# Patient Record
Sex: Male | Born: 1950 | Race: White | Hispanic: No | Marital: Married | Smoking: Former smoker
Health system: Southern US, Community
[De-identification: ages and names within clinical notes are randomized; demographics above are authoritative.]

## PROBLEM LIST (undated history)

## (undated) DIAGNOSIS — R1310 Dysphagia, unspecified: Secondary | ICD-10-CM

## (undated) DIAGNOSIS — T7840XA Allergy, unspecified, initial encounter: Secondary | ICD-10-CM

## (undated) DIAGNOSIS — F329 Major depressive disorder, single episode, unspecified: Secondary | ICD-10-CM

## (undated) DIAGNOSIS — M1990 Unspecified osteoarthritis, unspecified site: Secondary | ICD-10-CM

## (undated) DIAGNOSIS — C4491 Basal cell carcinoma of skin, unspecified: Secondary | ICD-10-CM

## (undated) DIAGNOSIS — I639 Cerebral infarction, unspecified: Secondary | ICD-10-CM

## (undated) DIAGNOSIS — K219 Gastro-esophageal reflux disease without esophagitis: Secondary | ICD-10-CM

## (undated) DIAGNOSIS — R1110 Vomiting, unspecified: Secondary | ICD-10-CM

## (undated) DIAGNOSIS — B019 Varicella without complication: Secondary | ICD-10-CM

## (undated) DIAGNOSIS — F32A Depression, unspecified: Secondary | ICD-10-CM

## (undated) DIAGNOSIS — B029 Zoster without complications: Secondary | ICD-10-CM

## (undated) DIAGNOSIS — Z860100 Personal history of colon polyps, unspecified: Secondary | ICD-10-CM

## (undated) DIAGNOSIS — IMO0001 Reserved for inherently not codable concepts without codable children: Secondary | ICD-10-CM

## (undated) DIAGNOSIS — B059 Measles without complication: Secondary | ICD-10-CM

## (undated) DIAGNOSIS — E785 Hyperlipidemia, unspecified: Secondary | ICD-10-CM

## (undated) DIAGNOSIS — B269 Mumps without complication: Secondary | ICD-10-CM

## (undated) DIAGNOSIS — H269 Unspecified cataract: Secondary | ICD-10-CM

## (undated) DIAGNOSIS — E7800 Pure hypercholesterolemia, unspecified: Secondary | ICD-10-CM

## (undated) DIAGNOSIS — K449 Diaphragmatic hernia without obstruction or gangrene: Secondary | ICD-10-CM

## (undated) DIAGNOSIS — N529 Male erectile dysfunction, unspecified: Secondary | ICD-10-CM

## (undated) DIAGNOSIS — K648 Other hemorrhoids: Secondary | ICD-10-CM

## (undated) DIAGNOSIS — F411 Generalized anxiety disorder: Secondary | ICD-10-CM

## (undated) DIAGNOSIS — J309 Allergic rhinitis, unspecified: Secondary | ICD-10-CM

## (undated) DIAGNOSIS — J302 Other seasonal allergic rhinitis: Secondary | ICD-10-CM

## (undated) HISTORY — DX: Cerebral infarction, unspecified: I63.9

## (undated) HISTORY — DX: Major depressive disorder, single episode, unspecified: F32.9

## (undated) HISTORY — DX: Mumps without complication: B26.9

## (undated) HISTORY — DX: Other hemorrhoids: K64.8

## (undated) HISTORY — DX: Vomiting, unspecified: R11.10

## (undated) HISTORY — DX: Dysphagia, unspecified: R13.10

## (undated) HISTORY — DX: Gastro-esophageal reflux disease without esophagitis: K21.9

## (undated) HISTORY — PX: FRACTURE SURGERY: SHX138

## (undated) HISTORY — DX: Male erectile dysfunction, unspecified: N52.9

## (undated) HISTORY — DX: Personal history of colon polyps, unspecified: Z86.0100

## (undated) HISTORY — DX: Hyperlipidemia, unspecified: E78.5

## (undated) HISTORY — DX: Allergic rhinitis, unspecified: J30.9

## (undated) HISTORY — DX: Zoster without complications: B02.9

## (undated) HISTORY — PX: SMALL INTESTINE SURGERY: SHX150

## (undated) HISTORY — DX: Allergy, unspecified, initial encounter: T78.40XA

## (undated) HISTORY — DX: Unspecified osteoarthritis, unspecified site: M19.90

## (undated) HISTORY — DX: Unspecified cataract: H26.9

## (undated) HISTORY — PX: EYE SURGERY: SHX253

## (undated) HISTORY — DX: Pure hypercholesterolemia, unspecified: E78.00

## (undated) HISTORY — DX: Measles without complication: B05.9

## (undated) HISTORY — DX: Other seasonal allergic rhinitis: J30.2

## (undated) HISTORY — DX: Generalized anxiety disorder: F41.1

## (undated) HISTORY — DX: Reserved for inherently not codable concepts without codable children: IMO0001

## (undated) HISTORY — DX: Depression, unspecified: F32.A

## (undated) HISTORY — DX: Varicella without complication: B01.9

## (undated) HISTORY — DX: Personal history of colonic polyps: Z86.010

## (undated) HISTORY — DX: Diaphragmatic hernia without obstruction or gangrene: K44.9

## (undated) HISTORY — DX: Basal cell carcinoma of skin, unspecified: C44.91

---

## 1989-03-11 HISTORY — PX: OTHER SURGICAL HISTORY: SHX169

## 1998-03-11 HISTORY — PX: OTHER SURGICAL HISTORY: SHX169

## 2008-03-11 HISTORY — PX: OTHER SURGICAL HISTORY: SHX169

## 2009-02-27 ENCOUNTER — Ambulatory Visit: Payer: Self-pay | Admitting: Gastroenterology

## 2010-03-11 DIAGNOSIS — I639 Cerebral infarction, unspecified: Secondary | ICD-10-CM

## 2010-03-11 DIAGNOSIS — H53452 Other localized visual field defect, left eye: Secondary | ICD-10-CM

## 2010-03-11 HISTORY — DX: Other localized visual field defect, left eye: H53.452

## 2010-03-11 HISTORY — DX: Cerebral infarction, unspecified: I63.9

## 2011-02-26 ENCOUNTER — Inpatient Hospital Stay: Payer: Self-pay | Admitting: Internal Medicine

## 2011-02-27 DIAGNOSIS — I6789 Other cerebrovascular disease: Secondary | ICD-10-CM

## 2011-03-26 ENCOUNTER — Encounter: Payer: Self-pay | Admitting: Family Medicine

## 2011-04-08 ENCOUNTER — Ambulatory Visit: Payer: Self-pay | Admitting: Family Medicine

## 2011-04-12 ENCOUNTER — Encounter: Payer: Self-pay | Admitting: Family Medicine

## 2011-05-01 ENCOUNTER — Ambulatory Visit: Payer: Self-pay | Admitting: Gastroenterology

## 2011-11-18 ENCOUNTER — Telehealth: Payer: Self-pay

## 2011-11-18 NOTE — Telephone Encounter (Signed)
Pt is having sleep issues and diarrhea - thinks it might be related to the lipitor.   Also, wants to know if we received the medical records from [facility].   Please call [phone]

## 2011-11-18 NOTE — Telephone Encounter (Signed)
Have we gotten his records?

## 2011-11-19 NOTE — Telephone Encounter (Signed)
Have patient stop the Lipitor at this time to see if this helps. Please have him follow up in the week to discuss medication management, sooner if the diarrhea persists. Please increase your water intake to help prevent dehydration due to the diarrhea as well.

## 2011-11-19 NOTE — Telephone Encounter (Signed)
Yes they came in today.

## 2011-11-19 NOTE — Telephone Encounter (Signed)
Patient is having difficulty with diarrhea thinks may be from the Lipitor he is taking, please advise.

## 2011-11-20 NOTE — Telephone Encounter (Signed)
Gave wife instr's to have pt DC Lipitor and see if Sxs resolve. If diarrhea persists have pt come in for eval. If Sxs resolve CB and we can check to see if Dr Katrinka Blazing wants to see pt sooner for med management bf his scheduled appt on 12/16/11. Wife agreed.

## 2011-12-02 ENCOUNTER — Encounter: Payer: Self-pay | Admitting: *Deleted

## 2011-12-03 ENCOUNTER — Encounter: Payer: Self-pay | Admitting: Family Medicine

## 2011-12-16 ENCOUNTER — Encounter: Payer: Self-pay | Admitting: Family Medicine

## 2011-12-16 ENCOUNTER — Ambulatory Visit (INDEPENDENT_AMBULATORY_CARE_PROVIDER_SITE_OTHER): Payer: BC Managed Care – PPO | Admitting: Family Medicine

## 2011-12-16 VITALS — BP 124/82 | HR 73 | Temp 98.2°F | Resp 18 | Ht 71.0 in | Wt 197.0 lb

## 2011-12-16 DIAGNOSIS — H9319 Tinnitus, unspecified ear: Secondary | ICD-10-CM

## 2011-12-16 DIAGNOSIS — I6350 Cerebral infarction due to unspecified occlusion or stenosis of unspecified cerebral artery: Secondary | ICD-10-CM

## 2011-12-16 DIAGNOSIS — J309 Allergic rhinitis, unspecified: Secondary | ICD-10-CM

## 2011-12-16 DIAGNOSIS — R7309 Other abnormal glucose: Secondary | ICD-10-CM

## 2011-12-16 DIAGNOSIS — E7800 Pure hypercholesterolemia, unspecified: Secondary | ICD-10-CM

## 2011-12-16 DIAGNOSIS — N529 Male erectile dysfunction, unspecified: Secondary | ICD-10-CM

## 2011-12-16 DIAGNOSIS — R1084 Generalized abdominal pain: Secondary | ICD-10-CM

## 2011-12-16 DIAGNOSIS — Z23 Encounter for immunization: Secondary | ICD-10-CM

## 2011-12-16 DIAGNOSIS — I639 Cerebral infarction, unspecified: Secondary | ICD-10-CM | POA: Insufficient documentation

## 2011-12-16 DIAGNOSIS — R197 Diarrhea, unspecified: Secondary | ICD-10-CM

## 2011-12-16 LAB — COMPREHENSIVE METABOLIC PANEL
ALT: 28 U/L (ref 0–53)
AST: 24 U/L (ref 0–37)
Albumin: 4.5 g/dL (ref 3.5–5.2)
CO2: 29 mEq/L (ref 19–32)
Calcium: 10.1 mg/dL (ref 8.4–10.5)
Chloride: 103 mEq/L (ref 96–112)
Creat: 0.97 mg/dL (ref 0.50–1.35)
Potassium: 4.9 mEq/L (ref 3.5–5.3)
Sodium: 138 mEq/L (ref 135–145)
Total Protein: 7.1 g/dL (ref 6.0–8.3)

## 2011-12-16 LAB — CBC
Hemoglobin: 15.2 g/dL (ref 13.0–17.0)
Platelets: 252 10*3/uL (ref 150–400)
RBC: 5.04 MIL/uL (ref 4.22–5.81)
WBC: 8.4 10*3/uL (ref 4.0–10.5)

## 2011-12-16 LAB — CK: Total CK: 87 U/L (ref 7–232)

## 2011-12-16 LAB — POCT GLYCOSYLATED HEMOGLOBIN (HGB A1C): Hemoglobin A1C: 5.2

## 2011-12-16 LAB — LIPID PANEL: Total CHOL/HDL Ratio: 3.9 Ratio

## 2011-12-16 MED ORDER — FLUTICASONE PROPIONATE 50 MCG/ACT NA SUSP: 2.0000 | Freq: Every day | NASAL | Status: DC

## 2011-12-16 MED ORDER — SILDENAFIL CITRATE 100 MG PO TABS: 50.0000 mg | ORAL_TABLET | Freq: Every day | ORAL | Status: DC | PRN

## 2011-12-16 NOTE — Patient Instructions (Addendum)
1. Pure hypercholesterolemia    2. Other abnormal glucose    3. CVA (cerebral infarction)    4. Tinnitus    5. Allergic rhinitis    6. Diarrhea    7. Abdominal pain, generalized    8. Need for prophylactic vaccination and inoculation against influenza  Flu vaccine greater than or equal to 63yo preservative free IM

## 2011-12-16 NOTE — Progress Notes (Signed)
Subjective:    Patient ID: Tyrone Curtis, male    DOB: 1950/08/27, 61 y.o.   MRN: [id number]  HPI44 year-old Caucasian male presenting to establish care and for three month follow-up for the following:  1.  Hypercholesterolemia:  Three month follow-up; no changes to management made at last visit.  Reports good tolerance to treatment, good compliance; good symptom control.  No side effects to Lipitor.  Denies chest pain, palpitations, shortness of breath, leg swelling; denies HA, dizziness, new focal weakness or paresthesias.  Did hold Lipitor due to diarrhea without improvement so has restarted it.  2.  Glucose Intolerance:  Six month follow-up; not as compliant with diet as previously had been.  Weight down one pound.  Has become more physically active.  3.  CVA:  Three month follow-up.  Now doing own yard work; last visit in 09/2011 with Tyrone Curtis/Neurology.  Follow-up in 02/2012.  Numbnesss L facial persistent, L side weakness much improved.  Using cane mainly due to vision impairment. Unable to drive permanently due to loss of peripheral vision.  Continues to take ASA 325mg  daily.  4.  Anxiety:  Stopped taking Xanax regularly; took Xanax twice last week.  Continues to get a little anxious in crowds.  Would like to enjoy a beer now and then.  5.  Insomnia:  Stopped taking Ambien.  Sleeping 7-8 hours; will wake up around 3:30am and stay awake for one hour and then returns to sleep. Has never required 8+ hours of sleep per night.    7.  Diarrhea:  Stopped Lipitor x 2 weeks due to diarrhea but no improvement so restarted Lipitor.  Grapes came in and was eating grapes daily; thinks diarrhea may have been due to grapes.  Feels diarrhea may be secondary to eating grapes.  Onset of abdominal ache lower abdomen for past one week.  Diarrhea resolved one week ago.  No melena; no bloody stools. Regular bowel movements.  No constipation. Appetite is normal.  Unable to tolerate chocolate. Was having issue with  peanut butter.  Compliance with Omeprazole daily.  No choking or GERD symptoms.  8.  Tinnitus:  Onset three weeks ago; B ears ringing; with shaking head, feels harp ringing in R ear.  +nasal congestion; only on L side.  L eye watering.    Does have Flonase but not using; taking Claritin one daily.  No associated hearing loss.  Worried that ASA 325mg  daily causing ringing in ears.  Also worried that Lipitor causing ringing in ears.  9.  ED: would like to restart Viagra if possible.    Review of Systems  Constitutional: Negative for fever, chills, diaphoresis and fatigue.  HENT: Positive for congestion, rhinorrhea, postnasal drip and tinnitus. Negative for hearing loss, ear pain, sore throat, sneezing, trouble swallowing and voice change.   Respiratory: Negative for cough and shortness of breath.   Cardiovascular: Negative for chest pain, palpitations and leg swelling.  Gastrointestinal: Positive for abdominal pain and diarrhea. Negative for nausea, vomiting, constipation and blood in stool.  Neurological: Positive for numbness. Negative for facial asymmetry, weakness, light-headedness and headaches.  Psychiatric/Behavioral: Positive for disturbed wake/sleep cycle. Negative for suicidal ideas and self-injury. The patient is nervous/anxious.         Past Medical History  Diagnosis Date  . CVA (cerebral vascular accident)   . Arthritis   . 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 Erectile dysfunction   . Anxiety state, unspecified   . Regurgitation   .  Dysphagia, unspecified   . Herpes zoster without mention of complication   . Pure hypercholesterolemia   . Allergic rhinitis, cause unspecified   . Basal cell carcinoma     facial  . Personal history of colonic polyps   . Internal hemorrhoids without mention of complication   . Measles   . Chicken pox   . Mumps     x 2    Past Surgical History  Procedure Date  . Small bowel mass 1991    removed part  of small intestine benign mass  . Basal cell carcinoma of anterior chest 2000    resection; [name]  . Cataract surgery 2010    with lens repacement Left    Prior to Admission medications   Medication Sig Start Date End Date Taking? Authorizing Provider  ALPRAZolam [name]) 0.5 MG tablet Take 0.5 mg by mouth at bedtime as needed.   Yes Historical Provider, MD  aspirin 325 MG EC tablet Take 325 mg by mouth daily.   Yes Historical Provider, MD  atorvastatin (LIPITOR) 20 MG tablet Take 20 mg by mouth daily.   Yes Historical Provider, MD  loratadine (CLARITIN) 10 MG tablet Take 10 mg by mouth daily.   Yes Historical Provider, MD  Multiple Vitamin (MULTIVITAMINS PO) Take by mouth daily.   Yes Historical Provider, MD  omeprazole (PRILOSEC) 20 MG capsule Take 20 mg by mouth daily.   Yes Historical Provider, MD  polycarbophil (FIBERCON) 625 MG tablet Take 625 mg by mouth daily.    Historical Provider, MD  zolpidem (AMBIEN) 10 MG tablet Take 10 mg by mouth at bedtime as needed.    Historical Provider, MD    Allergies  Allergen Reactions  . Zyrtec D (Cetirizine-Pseudoephedrine Er)     "talking out of his head"    History   Social History  . Marital Status: Married    Spouse Name: N/A    Number of Children: 1  . Years of Education: N/A   Occupational History  . Disability     CVA  . Previous owner of [employer]    Social History Main Topics  . Smoking status: Former Smoker -- 2.0 packs/day for 25 years    Types: Cigarettes  . Smokeless tobacco: Not on file   Comment: quit in 1984  . Alcohol Use: No     no alcohol use since CVA  . Drug Use: Not on file  . Sexually Active: Not on file   Other Topics Concern  . Not on file   Social History Narrative   Always uses seat belts. Smoke alarm and carbon monoxide detector in home.No Guns. Caffeine: Coffee 5 x daily.Married x 37 years Happily. Lives with spouse bought a beach house at [employer] in 2011. Also has mountain house in [place].  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 Exercise: 3 x week walking; 2 miles daily with wife, during winter months in gym-gazelle and stair-master. Pt doing PT 30 - 45 mins per day.    Family History  Problem Relation Age of Onset  . Hyperlipidemia Sister   . Hyperlipidemia Brother   . Diabetes Mother   . Depression Mother   . Esophageal varices Mother   . Anxiety disorder Sister   . Bipolar disorder Brother   . Cancer Father     unknown    Objective:   Physical Exam  Nursing note and vitals reviewed. Constitutional: He is oriented to person, place, and time.  He appears well-developed and well-nourished. No distress.  HENT:  Head: Normocephalic and atraumatic.  Right Ear: External ear normal.  Left Ear: External ear normal.  Nose: Nose normal.  Mouth/Throat: Oropharynx is clear and moist.  Eyes: Conjunctivae normal and EOM are normal. Pupils are equal, round, and reactive to light.  Neck: Normal range of motion. Neck supple. No thyromegaly present.  Cardiovascular: Normal rate, regular rhythm, normal heart sounds and intact distal pulses.   No murmur heard. Pulmonary/Chest: Effort normal and breath sounds normal.  Abdominal: Soft. Bowel sounds are normal. He exhibits no distension. There is no tenderness. There is no rebound.  Lymphadenopathy:    He has no cervical adenopathy.  Neurological: He is alert and oriented to person, place, and time. No cranial nerve deficit. He exhibits normal muscle tone. Coordination normal.  Skin: He is not diaphoretic.  Psychiatric: He has a normal mood and affect. His behavior is normal. Judgment and thought content normal.   INFLUENZA VACCINE ADMINISTERED.     Assessment & Plan:   1. Pure hypercholesterolemia  CBC, CK, Comprehensive metabolic panel, Lipid panel, POCT glycosylated hemoglobin (Hb A1C)  2. Other abnormal glucose  CBC, CK, Comprehensive metabolic panel, Lipid panel, POCT glycosylated  hemoglobin (Hb A1C)  3. CVA (cerebral infarction)  CBC, CK, Comprehensive metabolic panel, Lipid panel, POCT glycosylated hemoglobin (Hb A1C)  4. Tinnitus  CBC, CK, Comprehensive metabolic panel, Lipid panel, POCT glycosylated hemoglobin (Hb A1C)  5. Allergic rhinitis  CBC, CK, Comprehensive metabolic panel, Lipid panel, POCT glycosylated hemoglobin (Hb A1C), fluticasone (FLONASE) 50 MCG/ACT nasal spray  6. Diarrhea  CBC, CK, Comprehensive metabolic panel, Lipid panel, POCT glycosylated hemoglobin (Hb A1C)  7. Abdominal pain, generalized  CBC, CK, Comprehensive metabolic panel, Lipid panel, POCT glycosylated hemoglobin (Hb A1C)  8. Need for prophylactic vaccination and inoculation against influenza  Flu vaccine greater than or equal to 3yo preservative free IM, CBC, CK, Comprehensive metabolic panel, Lipid panel, POCT glycosylated hemoglobin (Hb A1C)  9. Pure hypercholesterolemia Acute CBC, CK, Comprehensive metabolic panel, Lipid panel, POCT glycosylated hemoglobin (Hb A1C)  10. Erectile dysfunction  sildenafil (VIAGRA) 100 MG tablet    1. CVA: stable; persistent L sided numbness; minimal weakness L side; persistent loss of peripheral vision.  Continue ASA 325mg  daily; follow-up neurology/Tyrone Curtis in 02/2012. 2.  Hyperlipidemia:  Stable; goal LDL< 70 due to CVA.  Obtain labs; continue with current medication. 3.  Glucose intolerance: stable; obtain labs.  Dietary modification. 4.  Allergic Rhinitis: worsening; restart Flonase daily; continue Claritin 10mg  daily.  May be etiology to tinnitus. 5.  ED: persistent; rx for Viagra; confirm with neurology/Tyrone Curtis that can resume medication. 6. Diarrhea: new and now improved.  Associated with abdominal pain; benign exam. BRAT diet, hydrate. 7.  Abdominal pain: New.  Onset past two weeks after having two weeks of diarrhea; obtain labs; BRAT diet; if persists > 1 month to call office. 8.  S/p flu vaccine. 9.  Tinnitus:  New.  Onset three weeks ago.  Treat  allergic rhinitis more aggressively with Flonase daily in addition to Claritin.  If persists, refer to ENT.  Continue ASA for secondary stroke prevention.  Meds ordered this encounter  Medications  . fluticasone (FLONASE) 50 MCG/ACT nasal spray    Sig: Place 2 sprays into the nose daily.    Dispense:  16 g    Refill:  6  . sildenafil (VIAGRA) 100 MG tablet    Sig: Take 0.5-1 tablets (50-100 mg total) by mouth daily as  needed for erectile dysfunction.    Dispense:  8 tablet    Refill:  11

## 2011-12-23 ENCOUNTER — Telehealth: Payer: Self-pay | Admitting: Radiology

## 2011-12-23 NOTE — Telephone Encounter (Signed)
Would like copy of labs sent to him, I will provide.

## 2012-03-15 ENCOUNTER — Encounter: Payer: Self-pay | Admitting: Family Medicine

## 2012-04-16 ENCOUNTER — Other Ambulatory Visit: Payer: Self-pay | Admitting: *Deleted

## 2012-04-16 MED ORDER — ATORVASTATIN CALCIUM 40 MG PO TABS: 40.0000 mg | ORAL_TABLET | Freq: Every day | ORAL | Status: DC

## 2012-05-12 ENCOUNTER — Encounter: Payer: BC Managed Care – PPO | Admitting: Family Medicine

## 2012-05-13 ENCOUNTER — Other Ambulatory Visit: Payer: Self-pay | Admitting: Physician Assistant

## 2012-05-16 ENCOUNTER — Other Ambulatory Visit: Payer: Self-pay | Admitting: Family Medicine

## 2012-06-16 ENCOUNTER — Other Ambulatory Visit: Payer: Self-pay | Admitting: Physician Assistant

## 2012-06-23 ENCOUNTER — Encounter: Payer: BC Managed Care – PPO | Admitting: Family Medicine

## 2012-07-08 ENCOUNTER — Other Ambulatory Visit: Payer: Self-pay

## 2012-07-08 MED ORDER — ATORVASTATIN CALCIUM 40 MG PO TABS: 40.0000 mg | ORAL_TABLET | Freq: Every day | ORAL | Status: DC

## 2012-07-20 ENCOUNTER — Encounter: Payer: Self-pay | Admitting: Family Medicine

## 2012-07-20 ENCOUNTER — Ambulatory Visit (INDEPENDENT_AMBULATORY_CARE_PROVIDER_SITE_OTHER): Payer: BC Managed Care – PPO | Admitting: Family Medicine

## 2012-07-20 VITALS — BP 126/72 | HR 66 | Temp 97.9°F | Resp 16 | Ht 71.0 in | Wt 222.0 lb

## 2012-07-20 DIAGNOSIS — Z0000 Encounter for general adult medical examination without abnormal findings: Secondary | ICD-10-CM

## 2012-07-20 LAB — COMPREHENSIVE METABOLIC PANEL
Albumin: 3.8 g/dL (ref 3.5–5.2)
Alkaline Phosphatase: 83 U/L (ref 39–117)
BUN: 10 mg/dL (ref 6–23)
CO2: 25 mEq/L (ref 19–32)
Calcium: 9.6 mg/dL (ref 8.4–10.5)
Chloride: 105 mEq/L (ref 96–112)
Glucose, Bld: 95 mg/dL (ref 70–99)
Potassium: 4.5 mEq/L (ref 3.5–5.3)

## 2012-07-20 LAB — LIPID PANEL
Cholesterol: 131 mg/dL (ref 0–200)
HDL: 36 mg/dL — ABNORMAL LOW (ref >39)
Triglycerides: 157 mg/dL — ABNORMAL HIGH (ref <150)

## 2012-07-20 LAB — POCT URINALYSIS DIPSTICK
Ketones, UA: NEGATIVE
Protein, UA: NEGATIVE
Spec Grav, UA: 1.01
pH, UA: 7

## 2012-07-20 LAB — CBC WITH DIFFERENTIAL/PLATELET
Basophils Absolute: 0 10*3/uL (ref 0.0–0.1)
Eosinophils Relative: 2 % (ref 0–5)
HCT: 35.6 % — ABNORMAL LOW (ref 39.0–52.0)
Lymphocytes Relative: 31 % (ref 12–46)
MCHC: 32 g/dL (ref 30.0–36.0)
MCV: 77.1 fL — ABNORMAL LOW (ref 78.0–100.0)
Monocytes Absolute: 0.5 10*3/uL (ref 0.1–1.0)
Monocytes Relative: 8 % (ref 3–12)
RDW: 13.5 % (ref 11.5–15.5)
WBC: 7.1 10*3/uL (ref 4.0–10.5)

## 2012-07-20 LAB — PSA: PSA: 0.78 ng/mL (ref <=4.00)

## 2012-07-20 LAB — TSH: TSH: 5.64 u[IU]/mL — ABNORMAL HIGH (ref 0.350–4.500)

## 2012-07-20 LAB — FOLATE: Folate: >20 ng/mL

## 2012-07-20 LAB — VITAMIN B12: Vitamin B-12: 356 pg/mL (ref 211–911)

## 2012-07-20 MED ORDER — ATORVASTATIN CALCIUM 40 MG PO TABS: 40.0000 mg | ORAL_TABLET | Freq: Every day | ORAL | Status: DC

## 2012-07-20 MED ORDER — OMEPRAZOLE 20 MG PO CPDR: 20.0000 mg | DELAYED_RELEASE_CAPSULE | Freq: Every day | ORAL | Status: DC

## 2012-07-20 MED ORDER — ALPRAZOLAM 0.5 MG PO TABS: 0.5000 mg | ORAL_TABLET | Freq: Every evening | ORAL | Status: DC | PRN

## 2012-07-20 NOTE — Progress Notes (Signed)
  Subjective:    Patient ID: Tyrone Curtis, male    DOB: 1950-06-10, 62 y.o.   MRN: [id number]  HPI    Review of Systems  HENT: Positive for hearing loss and tinnitus.   Eyes: Positive for visual disturbance.  Respiratory: Negative.   Cardiovascular: Negative.   Gastrointestinal: Negative.   Endocrine: Negative.   Genitourinary: Negative.   Musculoskeletal: Positive for gait problem.  Skin: Negative.   Neurological: Positive for weakness and numbness.  Hematological: Negative.   Psychiatric/Behavioral: Positive for confusion and agitation. The patient is nervous/anxious.        Objective:   Physical Exam        Assessment & Plan:

## 2012-07-20 NOTE — Progress Notes (Signed)
[address]   [place], [place]  [address]   [phone]  Subjective:    Patient ID: Tyrone Curtis, male    DOB: 27-Aug-1950, 62 y.o.   MRN: [id number]  HPI This 62 y.o. male presents for evaluation for CPE.    Last physical 04/30/11.  Colonoscopy 02/08/2009. Repeat in 3 years.   Iftikhar.  Does not really want to repeat now.   TDAP 04/30/11. Flu vaccine 12/16/11. Pneumovax never. Zostavax never. Eye exam. Dental exam.  Neurology: Sherryll Burger.  GINiel Hummer.    CVA:  Still numbness on L side; no peripheral vision improvement; no depth perception.  Has fallen twice in past six months.  Hit L anterior shin.   Tick bite:  Removed tick.  Still red mark.  One week.   Spends a lot of time in back yard.    Review of Systems  HENT: Positive for hearing loss and tinnitus.   Eyes: Positive for visual disturbance.  Respiratory: Negative.   Cardiovascular: Negative.   Gastrointestinal: Negative.   Endocrine: Negative.   Genitourinary: Negative.   Musculoskeletal: Positive for gait problem.  Skin: Negative.   Neurological: Positive for weakness and numbness.  Hematological: Negative.   Psychiatric/Behavioral: Positive for confusion and agitation. The patient is nervous/anxious.     Past Medical History  Diagnosis Date  . CVA (cerebral vascular accident)   . Arthritis   . 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 Erectile dysfunction   . Anxiety state, unspecified   . Regurgitation   . Dysphagia, unspecified(787.20)   . Herpes zoster without mention of complication   . Pure hypercholesterolemia   . Allergic rhinitis, cause unspecified   . Basal cell carcinoma     facial  . Personal history of colonic polyps   . Internal hemorrhoids without mention of complication   . Measles   . Chicken pox   . Mumps     x 2  . Depression   . Cataract     Past Surgical History  Procedure Laterality Date  . Small bowel mass  1991    removed part of small  intestine benign mass  . Basal cell carcinoma of anterior chest  2000    resection; [name]  . Cataract surgery  2010    with lens repacement Left  . Small intestine surgery      Prior to Admission medications   Medication Sig Start Date End Date Taking? Authorizing Provider  ALPRAZolam [name]) 0.5 MG tablet Take 1 tablet (0.5 mg total) by mouth at bedtime as needed. 07/20/12  Yes Ethelda Chick, MD  aspirin 325 MG EC tablet Take 325 mg by mouth daily.   Yes Historical Provider, MD  atorvastatin (LIPITOR) 40 MG tablet Take 1 tablet (40 mg total) by mouth daily. 07/20/12  Yes Ethelda Chick, MD  fluticasone (FLONASE) 50 MCG/ACT nasal spray Place 2 sprays into the nose daily. 12/16/11  Yes Ethelda Chick, MD  loratadine (CLARITIN) 10 MG tablet Take 10 mg by mouth daily.   Yes Historical Provider, MD  Multiple Vitamin (MULTIVITAMINS PO) Take by mouth daily.   Yes Historical Provider, MD  omeprazole (PRILOSEC) 20 MG capsule Take 1 capsule (20 mg total) by mouth daily. 07/20/12  Yes Ethelda Chick, MD  polycarbophil (FIBERCON) 625 MG tablet Take 625 mg by mouth daily.   Yes Historical Provider, MD    Allergies  Allergen Reactions  . Zyrtec D (Cetirizine-Pseudoephedrine Er)     "  talking out of his head"    History   Social History  . Marital Status: Married    Spouse Name: N/A    Number of Children: 1  . Years of Education: N/A   Occupational History  . Disability     CVA  . Previous owner of [employer]    Social History Main Topics  . Smoking status: Former Smoker -- 2.00 packs/day for 25 years    Types: Cigarettes  . Smokeless tobacco: Not on file     Comment: quit in 1984  . Alcohol Use: No     Comment: no alcohol use since CVA  . Drug Use: Not on file  . Sexually Active: Not on file   Other Topics Concern  . Not on file   Social History Narrative   Always uses seat belts. Smoke alarm and carbon monoxide detector in home.No Guns. Caffeine: Coffee 5 x daily.Married x 37  years Happily. Lives with spouse bought a beach house at [employer] in 2011. Also has mountain house in [place]. 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 Exercise: 3 x week walking; 2 miles daily with wife, during winter months in gym-gazelle and stair-master. Pt doing PT 30 - 45 mins per day.    Family History  Problem Relation Age of Onset  . Hyperlipidemia Sister   . Hyperlipidemia Brother   . Diabetes Mother   . Depression Mother   . Esophageal varices Mother   . Anxiety disorder Sister   . Bipolar disorder Brother   . Cancer Father     unknown       Objective:   Physical Exam  Nursing note and vitals reviewed. Constitutional: He is oriented to person, place, and time. He appears well-developed and well-nourished. No distress.  HENT:  Head: Normocephalic and atraumatic.  Right Ear: External ear normal.  Left Ear: External ear normal.  Nose: Nose normal.  Mouth/Throat: Oropharynx is clear and moist.  Eyes: Conjunctivae and EOM are normal. Pupils are equal, round, and reactive to light.  Loss of peripheral vision B.  Neck: Normal range of motion. Neck supple. No JVD present. No thyromegaly present.  Cardiovascular: Normal rate, regular rhythm, normal heart sounds and intact distal pulses.  Exam reveals no gallop and no friction rub.   No murmur heard. Pulmonary/Chest: Effort normal and breath sounds normal. He has no wheezes. He has no rales. He exhibits no tenderness.  Abdominal: Soft. Bowel sounds are normal. He exhibits no mass. There is no tenderness. There is no rebound and no guarding. Hernia confirmed negative in the right inguinal area and confirmed negative in the left inguinal area.  Genitourinary: Rectum normal, prostate normal, testes normal and penis normal. Circumcised.  Lymphadenopathy:    He has no cervical adenopathy.       Right: No inguinal adenopathy present.       Left: No inguinal adenopathy present.  Neurological: He is  alert and oriented to person, place, and time. He has normal strength. A sensory deficit is present. No cranial nerve deficit. He exhibits normal muscle tone. Coordination abnormal.  Skin: He is not diaphoretic.  Well healing puncture wound with very mild erythema on R.    Psychiatric: He has a normal mood and affect. His behavior is normal. Judgment and thought content normal.   EKG: NSR; no acute changes..    Assessment & Plan:  Routine general medical examination at a health care facility - Plan: POCT urinalysis  dipstick, CBC with Differential, CK, Comprehensive metabolic panel, Lipid panel, Hemoglobin A1c, PSA, TSH, Vitamin B12, Folate, Vitamin D 25 hydroxy, EKG 12-Lead, IFOBT POC (occult bld, rslt in office)   1.  CPE:  Anticipatory guidance provided.  Recommend repeat colonoscopy in the upcoming 1-2 years; hemosure obtained. Immunizations UTD other than Zostavax; advised to contact insurance regarding coverage.  Obtain labs.  Recommend weight loss, exercise; recommend continuing ASA 325mg  for secondary stroke prevention. 2. CVA: stable; persistent loss of peripheral vision; persistent L sided numbness; balance is unsteady due to numbness and loss of peripheral vision. Unable to drive. Continue ASA 325mg  once daily.

## 2012-07-28 ENCOUNTER — Encounter: Payer: Self-pay | Admitting: Family Medicine

## 2012-08-18 ENCOUNTER — Encounter: Payer: Self-pay | Admitting: Family Medicine

## 2012-08-18 ENCOUNTER — Ambulatory Visit (INDEPENDENT_AMBULATORY_CARE_PROVIDER_SITE_OTHER): Payer: BC Managed Care – PPO | Admitting: Family Medicine

## 2012-08-18 VITALS — BP 114/72 | HR 65 | Temp 98.4°F | Resp 16 | Ht 71.0 in | Wt 222.0 lb

## 2012-08-18 DIAGNOSIS — Z86010 Personal history of colonic polyps: Secondary | ICD-10-CM

## 2012-08-18 DIAGNOSIS — D649 Anemia, unspecified: Secondary | ICD-10-CM

## 2012-08-18 LAB — POCT CBC
Hemoglobin: 10.8 g/dL — AB (ref 14.1–18.1)
Lymph, poc: 2.3 (ref 0.6–3.4)
MCH, POC: 23.3 pg — AB (ref 27–31.2)
MCHC: 29.3 g/dL — AB (ref 31.8–35.4)
MID (cbc): 0.5 (ref 0–0.9)
MPV: 9.5 fL (ref 0–99.8)
POC LYMPH PERCENT: 31.8 %L (ref 10–50)
POC MID %: 7.2 %M (ref 0–12)
RDW, POC: 15.4 %
WBC: 7.1 10*3/uL (ref 4.6–10.2)

## 2012-08-18 NOTE — Patient Instructions (Addendum)
1.  EAT FOODS RICH IN IRON. 2.  RECOMMEND STARTING FERROUS SULFATE 325MG  ONE DAILY; FERROUS SULFATE MAY BE CONSTIPATING.

## 2012-08-18 NOTE — Progress Notes (Signed)
[address]   [place], [place]  [address]   [phone]  Subjective:    Patient ID: Tyrone Curtis, male    DOB: 1950-06-06, 62 y.o.   MRN: [id number]  HPI This 62 y.o. male presents for evaluation of anemia.  Presented for CPE in 07/2012.  Found to be mildly anemic at 11.4 but Hgb had dropped from 15.2 six months previously.  Colonoscopy 02/2009; repeat recommended in three years but pt did not desire to repeat for five years.  Denies bloody or melanotic stools.  Hemoccult negative in office last month.  Denies abdominal pain, nausea, vomiting, GERD, diarrhea, constipation, change in bowel habits.  Was placed on ASA 325mg  after CVA in past two years.    Review of Systems  Constitutional: Negative for fever, chills, diaphoresis, activity change, appetite change and fatigue.  Gastrointestinal: Negative for nausea, vomiting, abdominal pain, diarrhea, constipation, blood in stool, abdominal distention, anal bleeding and rectal pain.  Hematological: Negative for adenopathy. Does not bruise/bleed easily.    Past Medical History  Diagnosis Date  . CVA (cerebral vascular accident)   . Arthritis   . 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 Erectile dysfunction   . Anxiety state, unspecified   . Regurgitation   . Dysphagia, unspecified(787.20)   . Herpes zoster without mention of complication   . Pure hypercholesterolemia   . Allergic rhinitis, cause unspecified   . Basal cell carcinoma     facial  . Personal history of colonic polyps   . Internal hemorrhoids without mention of complication   . Measles   . Chicken pox   . Mumps     x 2  . Depression   . Cataract     Past Surgical History  Procedure Laterality Date  . Small bowel mass  1991    removed part of small intestine benign mass  . Basal cell carcinoma of anterior chest  2000    resection; [name]  . Cataract surgery  2010    with lens repacement Left  . Small intestine surgery       Prior to Admission medications   Medication Sig Start Date End Date Taking? Authorizing Provider  ALPRAZolam [name]) 0.5 MG tablet Take 1 tablet (0.5 mg total) by mouth at bedtime as needed. 07/20/12  Yes Ethelda Chick, MD  aspirin 325 MG EC tablet Take 325 mg by mouth daily.   Yes Historical Provider, MD  atorvastatin (LIPITOR) 40 MG tablet Take 1 tablet (40 mg total) by mouth daily. 07/20/12  Yes Ethelda Chick, MD  fluticasone (FLONASE) 50 MCG/ACT nasal spray Place 2 sprays into the nose daily. 12/16/11  Yes Ethelda Chick, MD  loratadine (CLARITIN) 10 MG tablet Take 10 mg by mouth daily.   Yes Historical Provider, MD  Multiple Vitamin (MULTIVITAMINS PO) Take by mouth daily.   Yes Historical Provider, MD  omeprazole (PRILOSEC) 20 MG capsule Take 1 capsule (20 mg total) by mouth daily. 07/20/12  Yes Ethelda Chick, MD  polycarbophil (FIBERCON) 625 MG tablet Take 625 mg by mouth daily.   Yes Historical Provider, MD    Allergies  Allergen Reactions  . Zyrtec D (Cetirizine-Pseudoephedrine Er)     "talking out of his head"    History   Social History  . Marital Status: Married    Spouse Name: N/A    Number of Children: 1  . Years of Education: N/A   Occupational History  . Disability  CVA  . Previous owner of [employer]    Social History Main Topics  . Smoking status: Former Smoker -- 2.00 packs/day for 25 years    Types: Cigarettes  . Smokeless tobacco: Not on file     Comment: quit in 1984  . Alcohol Use: No     Comment: no alcohol use since CVA  . Drug Use: Not on file  . Sexually Active: Not on file   Other Topics Concern  . Not on file   Social History Narrative   Always uses seat belts. Smoke alarm and carbon monoxide detector in home.No Guns. Caffeine: Coffee 5 x daily.Married x 37 years Happily. Lives with spouse bought a beach house at [employer] in 2011. Also has mountain house in [place]. UNABLE to drive due to visual impairment/loss of peripheral vision from  CVA. Exercise: 3 x week walking; 2 miles daily with wife, during winter months in gym-gazelle and stair-master. Pt doing PT 30 - 45 mins per day.    Family History  Problem Relation Age of Onset  . Hyperlipidemia Sister   . Hyperlipidemia Brother   . Diabetes Mother   . Depression Mother   . Esophageal varices Mother   . Anxiety disorder Sister   . Bipolar disorder Brother   . Cancer Father     unknown       Objective:   Physical Exam  Nursing note and vitals reviewed. Constitutional: He appears well-developed and well-nourished.  Cardiovascular: Normal rate and regular rhythm.   Pulmonary/Chest: Effort normal and breath sounds normal.  Abdominal: Soft. Bowel sounds are normal. He exhibits no distension and no mass. There is no tenderness. There is no rebound and no guarding.    Results for orders placed in visit on 08/18/12  IRON AND TIBC      Result Value Range   Iron 25 (*) 42 - 165 ug/dL   UIBC 161 (*) 096 - 045 ug/dL   TIBC 409 (*) 811 - 914 ug/dL   %SAT 5 (*) 20 - 55 %  FERRITIN      Result Value Range   Ferritin 8 (*) 22 - 322 ng/mL  POCT CBC      Result Value Range   WBC 7.1  4.6 - 10.2 K/uL   Lymph, poc 2.3  0.6 - 3.4   POC LYMPH PERCENT 31.8  10 - 50 %L   MID (cbc) 0.5  0 - 0.9   POC MID % 7.2  0 - 12 %M   POC Granulocyte 4.3  2 - 6.9   Granulocyte percent 61.0  37 - 80 %G   RBC 4.64 (*) 4.69 - 6.13 M/uL   Hemoglobin 10.8 (*) 14.1 - 18.1 g/dL   HCT, POC 78.2 (*) 95.6 - 53.7 %   MCV 79.4 (*) 80 - 97 fL   MCH, POC 23.3 (*) 27 - 31.2 pg   MCHC 29.3 (*) 31.8 - 35.4 g/dL   RDW, POC 21.3     Platelet Count, POC 271  142 - 424 K/uL   MPV 9.5  0 - 99.8 fL  IFOBT (OCCULT BLOOD)      Result Value Range   IFOBT Positive         Assessment & Plan:  Anemia, unspecified - Plan: POCT CBC, Iron and TIBC, Ferritin, IFOBT POC (occult bld, rslt in office), Ambulatory referral to Gastroenterology  Personal history of colonic polyps  1. Anemia:  New and  persistent.  Hemosure + in  office; refer for colonoscopy and possible EGD. Obtain iron studies; start ferrous sulfate 325mg  one daily. Close follow-up to confirm anemia improving. 2.  Personal history of colon polyps: Stable; due for colonoscopy; refer to GI for repeat colonoscopy.  No orders of the defined types were placed in this encounter.

## 2012-08-19 LAB — IRON AND TIBC: %SAT: 5 % — ABNORMAL LOW (ref 20–55)

## 2012-08-21 ENCOUNTER — Encounter: Payer: Self-pay | Admitting: Family Medicine

## 2012-08-24 ENCOUNTER — Telehealth: Payer: Self-pay

## 2012-08-24 NOTE — Telephone Encounter (Signed)
Dr. Katrinka Blazing do you know anything about this

## 2012-08-24 NOTE — Telephone Encounter (Signed)
KAREN STATES SHE HAD DROPPED OFF SOME FORMS FOR DR Katrinka Blazing TO FILL OUT REGARDING TAXES AND ALSO IT WAS A FORM FROM THE INSURANCE COMPANY SHE NEEDED TO FILL OUT SO THEY CAN GET A DEDUCTION ON THEIR TAXES. DROPPED THEM IN MAY. PLEASE CALL [phone]

## 2012-08-31 NOTE — Telephone Encounter (Signed)
Forms ready for pick up on Friday, 08/28/12.  Pt advised.

## 2012-09-15 ENCOUNTER — Encounter: Payer: Self-pay | Admitting: Family Medicine

## 2012-09-16 NOTE — Telephone Encounter (Signed)
Scheduling, please schedule patient appointment with me around July 22; you can overbook me.

## 2012-09-23 HISTORY — PX: ESOPHAGOGASTRODUODENOSCOPY: SHX1529

## 2012-09-23 HISTORY — PX: COLONOSCOPY: SHX174

## 2012-09-24 ENCOUNTER — Telehealth: Payer: Self-pay

## 2012-09-24 NOTE — Telephone Encounter (Signed)
Patients wife wants Dr. Katrinka Blazing to write a script for integra 125mg  iron supplement to [facility]   I explained to the wife Dr. Katrinka Blazing would need to see the patient and she replied he has an appointment on the 28th.    [phone]

## 2012-09-24 NOTE — Telephone Encounter (Signed)
Wife advised we can send this in for patient he has appt scheduled for this month. Please review and make sure this is the correct iron suppliment as requested.

## 2012-09-25 MED ORDER — INTEGRA F 125-1 MG PO CAPS: 1.0000 | ORAL_CAPSULE | Freq: Every day | ORAL | Status: DC

## 2012-09-25 NOTE — Telephone Encounter (Signed)
Rx for Integra 125mg  sent to [facility].  Please advise patient.

## 2012-09-25 NOTE — Telephone Encounter (Signed)
His wife was advised.

## 2012-10-05 ENCOUNTER — Encounter: Payer: Self-pay | Admitting: Family Medicine

## 2012-10-05 ENCOUNTER — Ambulatory Visit (INDEPENDENT_AMBULATORY_CARE_PROVIDER_SITE_OTHER): Payer: BC Managed Care – PPO | Admitting: Family Medicine

## 2012-10-05 VITALS — BP 130/84 | HR 63 | Temp 98.3°F | Resp 16 | Ht 71.5 in | Wt 224.8 lb

## 2012-10-05 DIAGNOSIS — J0100 Acute maxillary sinusitis, unspecified: Secondary | ICD-10-CM

## 2012-10-05 DIAGNOSIS — K921 Melena: Secondary | ICD-10-CM

## 2012-10-05 DIAGNOSIS — D509 Iron deficiency anemia, unspecified: Secondary | ICD-10-CM

## 2012-10-05 LAB — POCT CBC
Lymph, poc: 2.2 (ref 0.6–3.4)
MCHC: 30.9 g/dL — AB (ref 31.8–35.4)
MID (cbc): 0.4 (ref 0–0.9)
MPV: 10 fL (ref 0–99.8)
POC Granulocyte: 4.9 (ref 2–6.9)
POC LYMPH PERCENT: 29.3 %L (ref 10–50)
POC MID %: 5.6 %M (ref 0–12)
Platelet Count, POC: 272 10*3/uL (ref 142–424)
RDW, POC: 23.5 %

## 2012-10-05 MED ORDER — AMOXICILLIN 500 MG PO CAPS: 1000.0000 mg | ORAL_CAPSULE | Freq: Two times a day (BID) | ORAL | Status: DC

## 2012-10-05 NOTE — Progress Notes (Signed)
[address]   [place], [place]  [address]   [phone]  Subjective:    Patient ID: Tyrone Curtis, male    DOB: Apr 10, 1950, 62 y.o.   MRN: [id number]  HPI This 62 y.o. male presents for six week follow-up of iron deficiency anemia and hemoccult + stools.  Started on ferrous sulfate 325mg  one daily at last visit.  Referred to GI for repeat colonoscopy and EGD.   [facility] provider gave a sample of an iron supplement; called for rx to be sent to pharmacy; could not purchase OTC.  Energy level has improved.  S/p EGD and colonoscopy; more uncomfortable.  EGD and colonoscopy normal.  No source of bleeding.  2.  R sided headache, sore throat:  Onset two weeks ago.  No fever/chills/sweats.  +PND.  Had to stop Claritin for procedure/EGD/colonoscopy.  +rhinorrhea yellow-white thick drainage.  No ear pain; +ST; scant cough.  No SOB.  Has restarted Claritin daily.  +decreased energy for past two weeks with symptoms.  Review of Systems  Constitutional: Positive for activity change and fatigue. Negative for fever, chills and appetite change.  HENT: Positive for congestion, sore throat, rhinorrhea, postnasal drip and sinus pressure. Negative for ear pain, drooling, mouth sores, neck pain, neck stiffness and voice change.   Respiratory: Positive for cough. Negative for shortness of breath.   Cardiovascular: Negative for chest pain, palpitations and leg swelling.  Gastrointestinal: Negative for nausea, vomiting, abdominal pain, diarrhea, constipation, blood in stool, abdominal distention, anal bleeding and rectal pain.  Neurological: Positive for headaches. Negative for dizziness and light-headedness.    Past Medical History  Diagnosis Date  . CVA (cerebral vascular accident)   . Arthritis   . 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 Erectile dysfunction   . Anxiety state, unspecified   . Regurgitation   . Dysphagia, unspecified(787.20)   . Herpes zoster without  mention of complication   . Pure hypercholesterolemia   . Allergic rhinitis, cause unspecified   . Basal cell carcinoma     facial  . Personal history of colonic polyps   . Internal hemorrhoids without mention of complication   . Measles   . Chicken pox   . Mumps     x 2  . Depression   . Cataract     Past Surgical History  Procedure Laterality Date  . Small bowel mass  1991    removed part of small intestine benign mass  . Basal cell carcinoma of anterior chest  2000    resection; [name]  . Cataract surgery  2010    with lens repacement Left  . Small intestine surgery      Prior to Admission medications   Medication Sig Start Date End Date Taking? Authorizing Provider  ALPRAZolam [name]) 0.5 MG tablet Take 1 tablet (0.5 mg total) by mouth at bedtime as needed. 07/20/12  Yes Ethelda Chick, MD  aspirin 325 MG EC tablet Take 81 mg by mouth daily.    Yes Historical Provider, MD  atorvastatin (LIPITOR) 40 MG tablet Take 1 tablet (40 mg total) by mouth daily. 07/20/12  Yes Ethelda Chick, MD  Fe Fum-FePoly-FA-Vit C-Vit B3 (INTEGRA F) 125-1 MG CAPS Take 1 capsule by mouth daily. 09/25/12  Yes Ethelda Chick, MD  fluticasone (FLONASE) 50 MCG/ACT nasal spray Place 2 sprays into the nose daily. 12/16/11  Yes Ethelda Chick, MD  loratadine (CLARITIN) 10 MG tablet Take 10 mg by mouth daily.  Yes Historical Provider, MD  Multiple Vitamin (MULTIVITAMINS PO) Take by mouth daily.   Yes Historical Provider, MD  omeprazole (PRILOSEC) 20 MG capsule Take 1 capsule (20 mg total) by mouth daily. 07/20/12  Yes Ethelda Chick, MD  polycarbophil (FIBERCON) 625 MG tablet Take 625 mg by mouth daily.   Yes Historical Provider, MD  amoxicillin (AMOXIL) 500 MG capsule Take 2 capsules (1,000 mg total) by mouth 2 (two) times daily. 10/05/12   Ethelda Chick, MD    Allergies  Allergen Reactions  . Zyrtec D (Cetirizine-Pseudoephedrine Er)     "talking out of his head"    History   Social History  .  Marital Status: Married    Spouse Name: N/A    Number of Children: 1  . Years of Education: N/A   Occupational History  . Disability     CVA  . Previous owner of [employer]    Social History Main Topics  . Smoking status: Former Smoker -- 2.00 packs/day for 25 years    Types: Cigarettes  . Smokeless tobacco: Not on file     Comment: quit in 1984  . Alcohol Use: No     Comment: no alcohol use since CVA  . Drug Use: Not on file  . Sexually Active: Not on file   Other Topics Concern  . Not on file   Social History Narrative   Always uses seat belts. Smoke alarm and carbon monoxide detector in home.No Guns. Caffeine: Coffee 5 x daily.Married x 37 years Happily. Lives with spouse bought a beach house at [employer] in 2011. Also has mountain house in [place]. 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 Exercise: 3 x week walking; 2 miles daily with wife, during winter months in gym-gazelle and stair-master. Pt doing PT 30 - 45 mins per day.    Family History  Problem Relation Age of Onset  . Hyperlipidemia Sister   . Hyperlipidemia Brother   . Diabetes Mother   . Depression Mother   . Esophageal varices Mother   . Anxiety disorder Sister   . Bipolar disorder Brother   . Cancer Father     unknown       Objective:   Physical Exam  Nursing note and vitals reviewed. Constitutional: He is oriented to person, place, and time. He appears well-developed and well-nourished.  HENT:  Head: Normocephalic and atraumatic.  Right Ear: External ear normal.  Left Ear: External ear normal.  Nose: Mucosal edema and rhinorrhea present. Right sinus exhibits maxillary sinus tenderness. Right sinus exhibits no frontal sinus tenderness. Left sinus exhibits maxillary sinus tenderness. Left sinus exhibits no frontal sinus tenderness.  Mouth/Throat: Oropharynx is clear and moist.  Eyes: Conjunctivae are normal. Pupils are equal, round, and reactive to light.  Neck:  Normal range of motion. Neck supple.  Cardiovascular: Normal rate, regular rhythm and normal heart sounds.   Pulmonary/Chest: Effort normal and breath sounds normal.  Neurological: He is alert and oriented to person, place, and time.  Skin: Skin is warm and dry.  Psychiatric: He has a normal mood and affect. His behavior is normal.      Results for orders placed in visit on 10/05/12  POCT CBC      Result Value Range   WBC 7.5  4.6 - 10.2 K/uL   Lymph, poc 2.2  0.6 - 3.4   POC LYMPH PERCENT 29.3  10 - 50 %L   MID (cbc) 0.4  0 - 0.9   POC MID % 5.6  0 - 12 %M   POC Granulocyte 4.9  2 - 6.9   Granulocyte percent 65.1  37 - 80 %G   RBC 5.69  4.69 - 6.13 M/uL   Hemoglobin 14.5  14.1 - 18.1 g/dL   HCT, POC 16.1  09.6 - 53.7 %   MCV 82.4  80 - 97 fL   MCH, POC 25.5 (*) 27 - 31.2 pg   MCHC 30.9 (*) 31.8 - 35.4 g/dL   RDW, POC 04.5     Platelet Count, POC 272  142 - 424 K/uL   MPV 10.0  0 - 99.8 fL    Assessment & Plan:  Iron deficiency anemia, unspecified - Plan: POCT CBC  Sinusitis, acute maxillary  Blood in stool  1. Iron deficiency anemia:   improved; s/p EGD and colonoscopy that were both negative.  Asymptomatic; continue iron supplement until follow-up on 12/2012. 2.  Acute Maxillary Sinusitis:  New. RX for Amoxicillin sent to pharmacy. 3.  CVA hx: increase ASA 81mg  to bid; if H/H remains normal at visit in two months, will need to increase ASA to 325mg  daily.   4.  Blood in stool: s/p EGD and colonoscopy both negative; source of bleeding not identified.  Meds ordered this encounter  Medications  . amoxicillin (AMOXIL) 500 MG capsule    Sig: Take 2 capsules (1,000 mg total) by mouth 2 (two) times daily.    Dispense:  40 capsule    Refill:  0

## 2012-12-06 ENCOUNTER — Encounter: Payer: Self-pay | Admitting: Family Medicine

## 2012-12-22 ENCOUNTER — Ambulatory Visit: Payer: BC Managed Care – PPO | Admitting: Family Medicine

## 2013-01-14 ENCOUNTER — Other Ambulatory Visit: Payer: Self-pay

## 2013-01-16 IMAGING — CT CT HEAD WITHOUT CONTRAST
2 series · 15 of 30 positions shown, 19 images · non-contrast
Comparison: none

REASON FOR EXAM: CVA
COMMENTS:   May transport without cardiac monitor

[Series 2: without · axial · non-contrast · 0.47mm/px · z∈[+217,+352]mm · 13 of 33 slices shown, 17 images]
[im 3/33  brain]
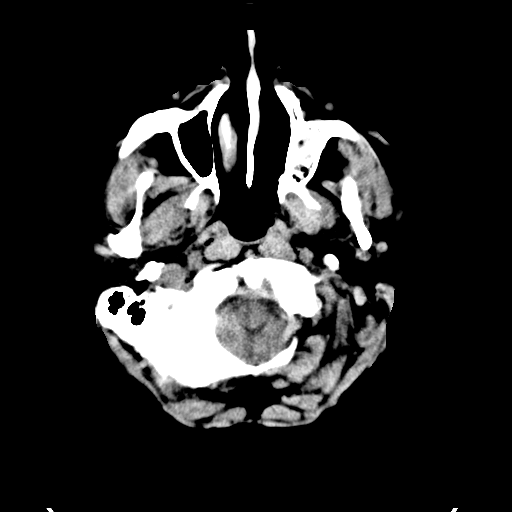
[im 3/33  bone]
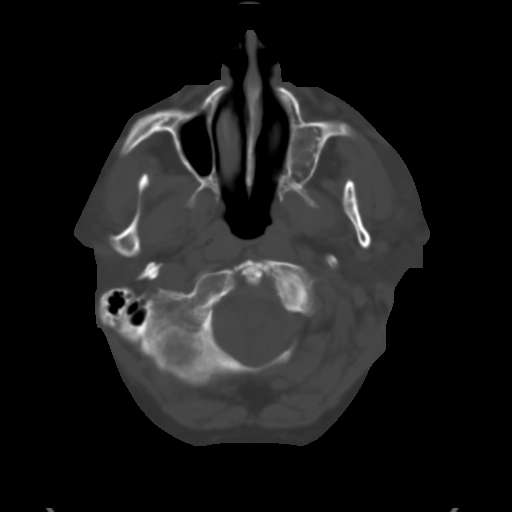
[im 5/33  brain]
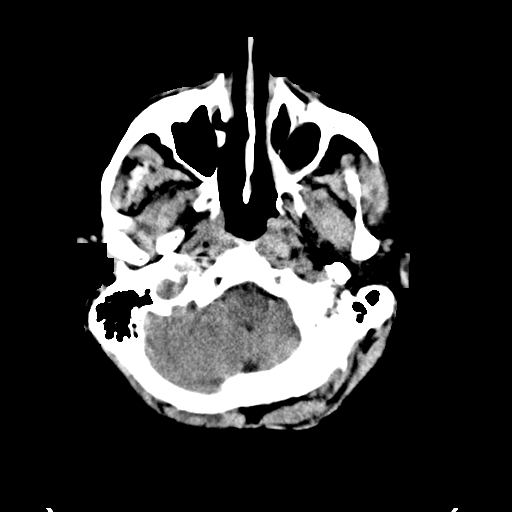
[im 7/33  brain]
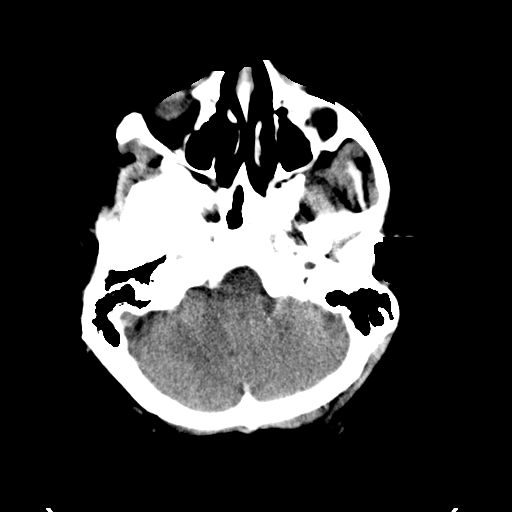
[im 10/33  brain]
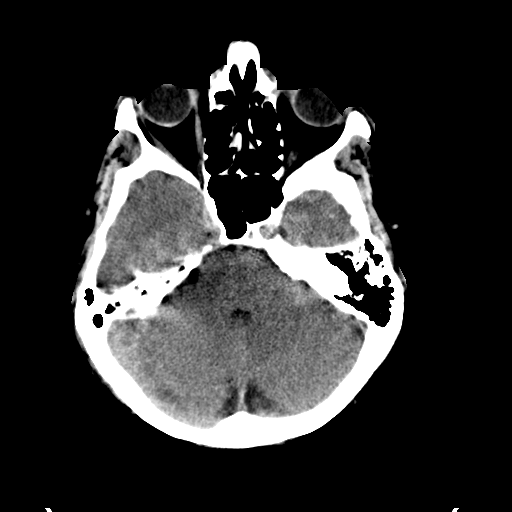
[im 12/33  brain]
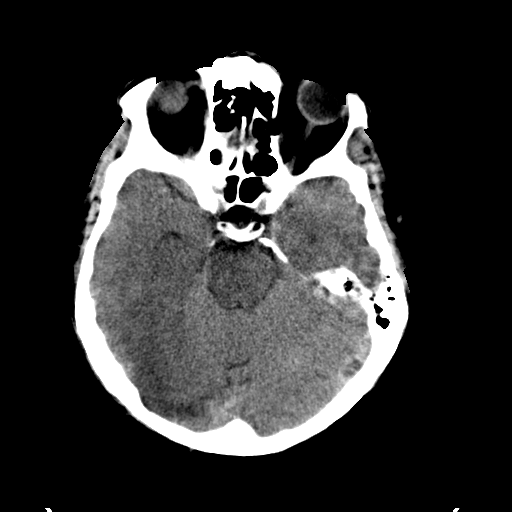
[im 12/33  bone]
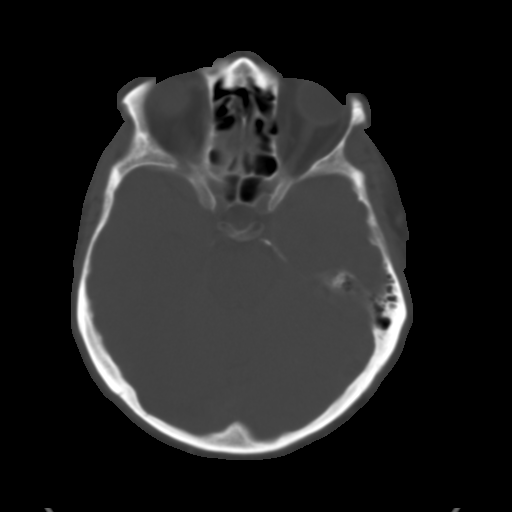
[im 14/33  brain]
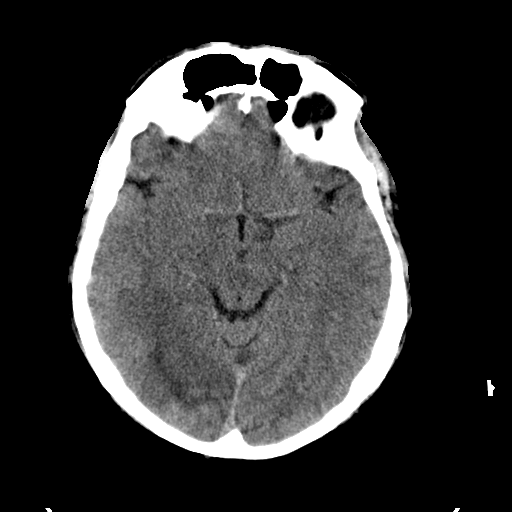
[im 17/33  brain]
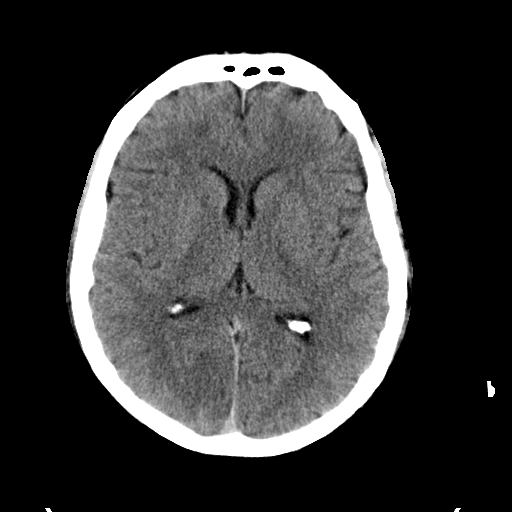
[im 19/33  brain]
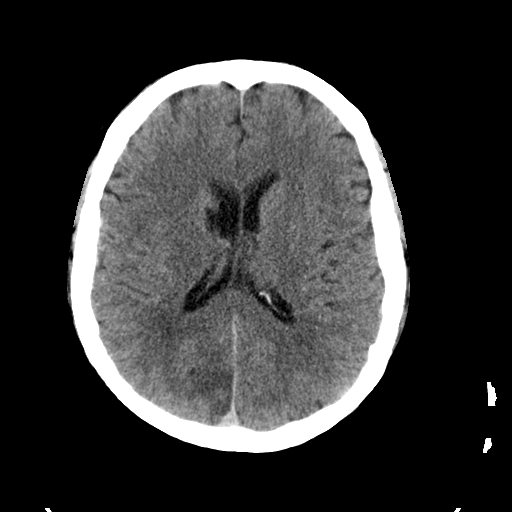
[im 21/33  brain]
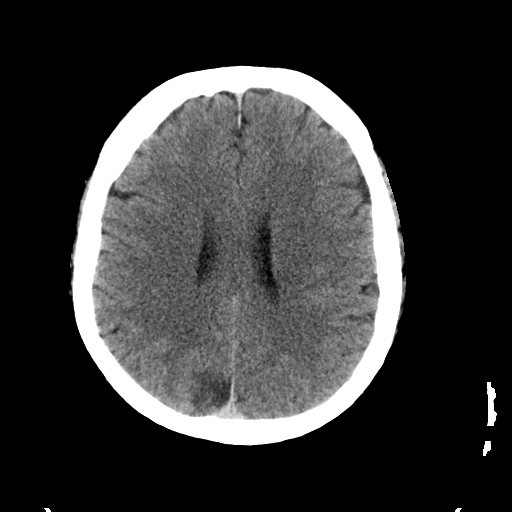
[im 21/33  bone]
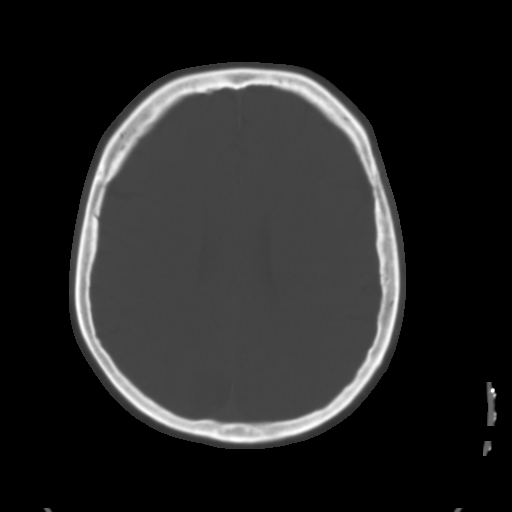
[im 23/33  brain]
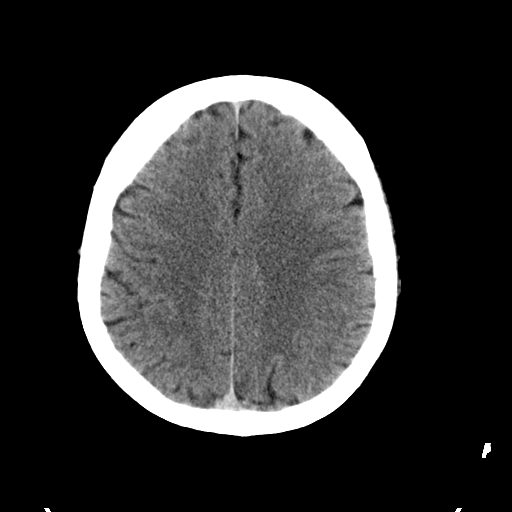
[im 26/33  brain]
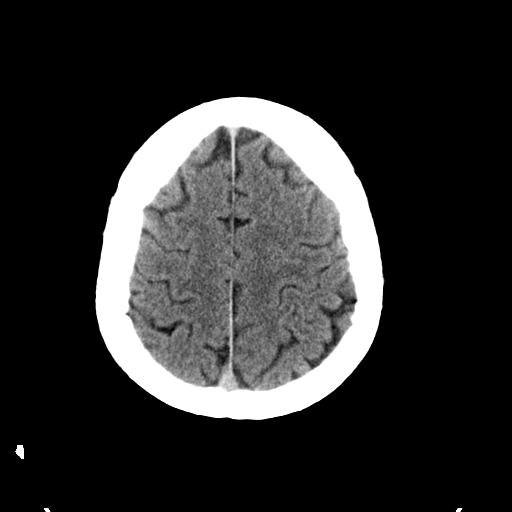
[im 28/33  brain]
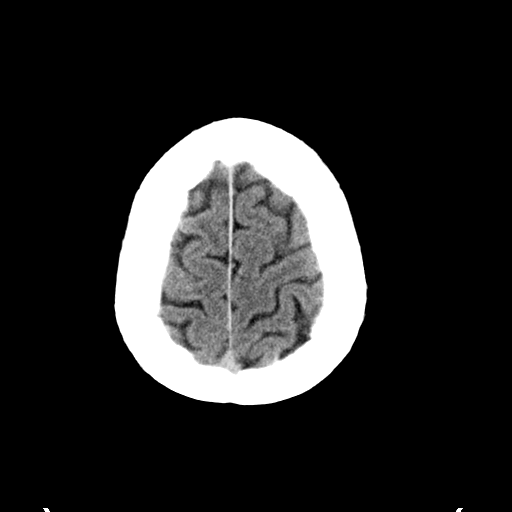
[im 30/33  brain]
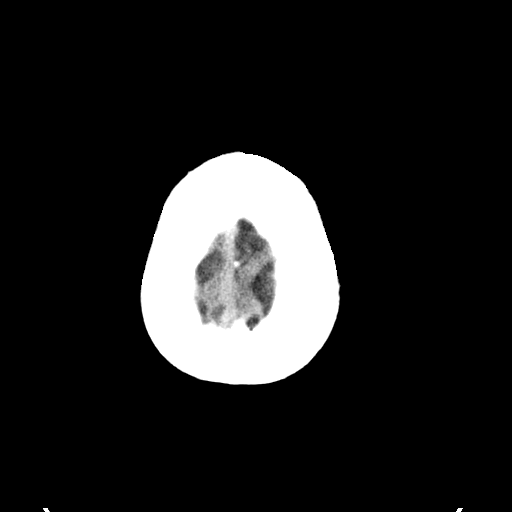
[im 30/33  bone]
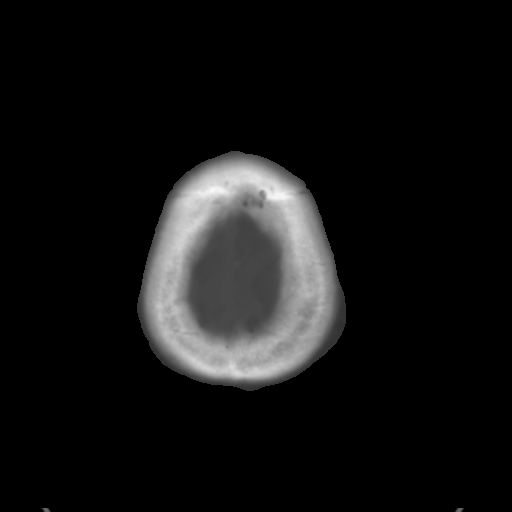

[Series 3: bone · axial · 0.47mm/px · z∈[+217,+237]mm · 2 of 33 slices shown]
[im 3/33  bone]
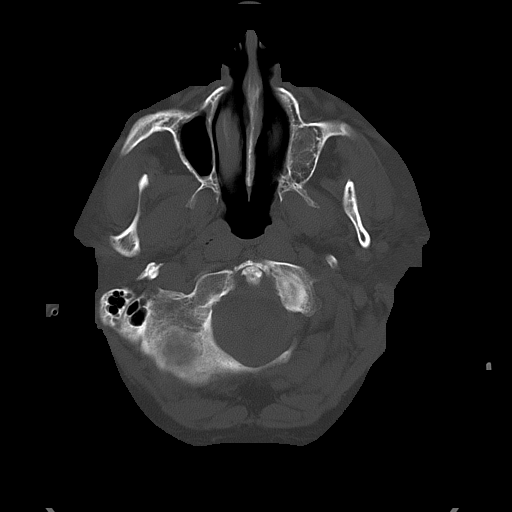
[im 7/33  bone]
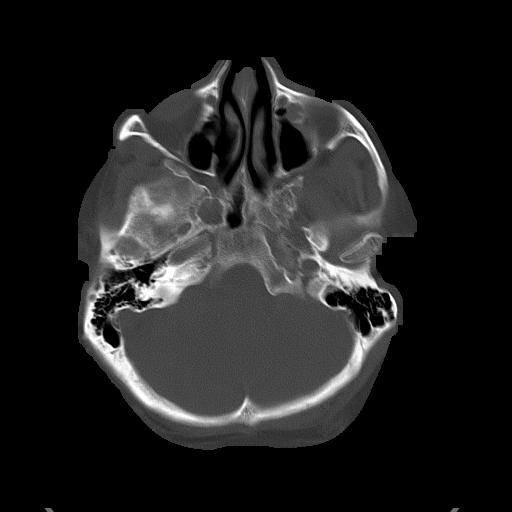

[15 of 30 positions shown; findings below may reference images not displayed]

PROCEDURE:     CT  - CT HEAD WITHOUT CONTRAST  - February 26, 2011  [DATE]

RESULT:     Axial noncontrast CT scanning was performed through the brain
with reconstructions at 5 mm intervals and slice thicknesses. There are no
prior studies for comparison.

There is abnormal hypodensity in the right posterior parietal and occipital
lobes consistent with evolving ischemic change. The right frontal and
temporal lobes appear preserved. There is no shift of the midline. There is
an old lacunar infarction in the caudate nucleus on the right. The left
cerebral hemisphere and the cerebellum appear normal. At bone window
settings there is mucoperiosteal thickening within the left maxillary sinus.
There is no evidence of an acute skull fracture.
IMPRESSION: 1. The findings are worrisome for evolving ischemic infarction in the
posterior parietal and occipital lobes on the right. There is an old lacunar
infarction in the right caudate nucleus.
2. I see no acute intracranial hemorrhage nor evidence of midline shift.

A preliminary report was sent to the [HOSPITAL] the conclusion
of the study.

## 2013-01-17 IMAGING — CT CT HEAD WITHOUT CONTRAST
2 series · 15 of 30 positions shown, 19 images · non-contrast
Comparison: none

REASON FOR EXAM: f/u CVA
COMMENTS:

[Series 2: without · axial · non-contrast · 0.43mm/px · z∈[-166,-36]mm · 13 of 32 slices shown, 17 images]
[im 3/32  brain]
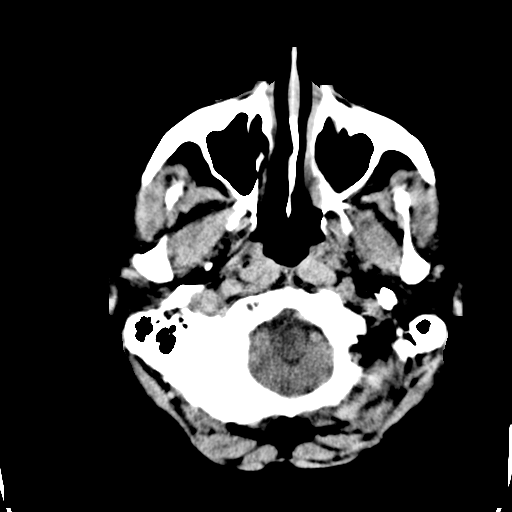
[im 3/32  bone]
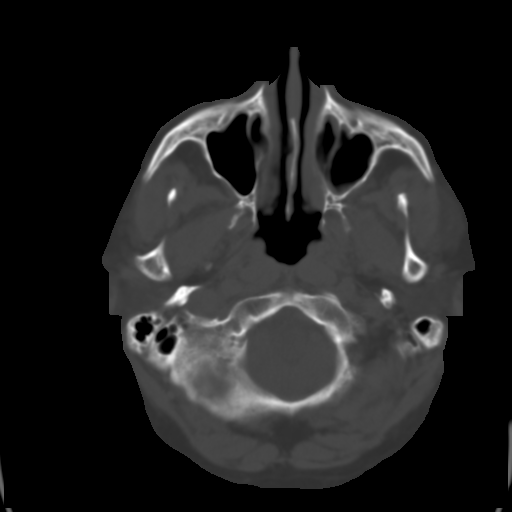
[im 5/32  brain]
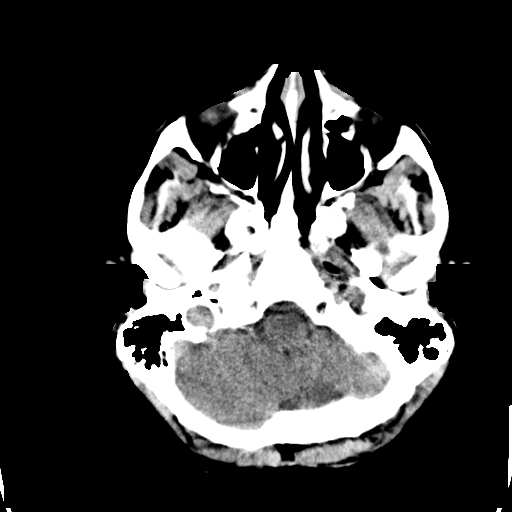
[im 7/32  brain]
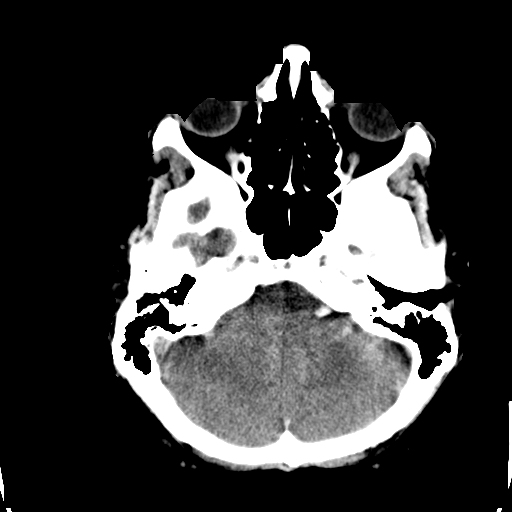
[im 9/32  brain]
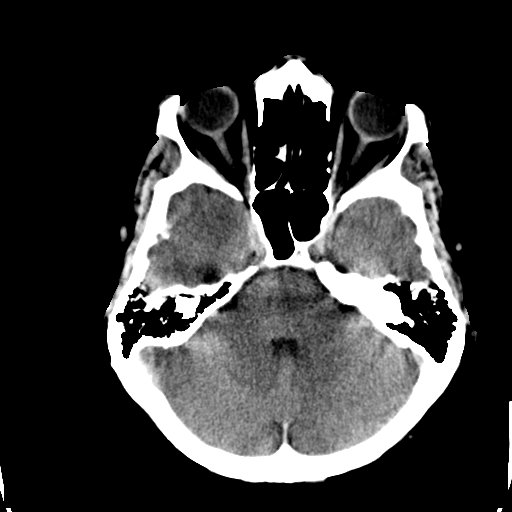
[im 12/32  brain]
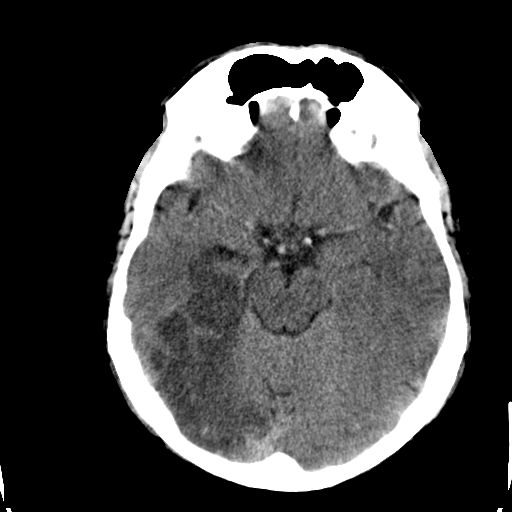
[im 12/32  bone]
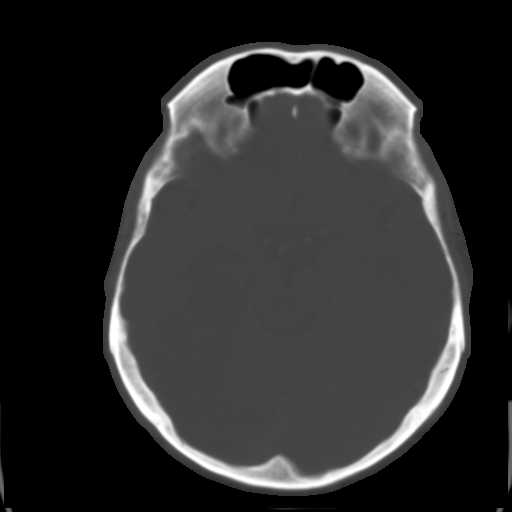
[im 14/32  brain]
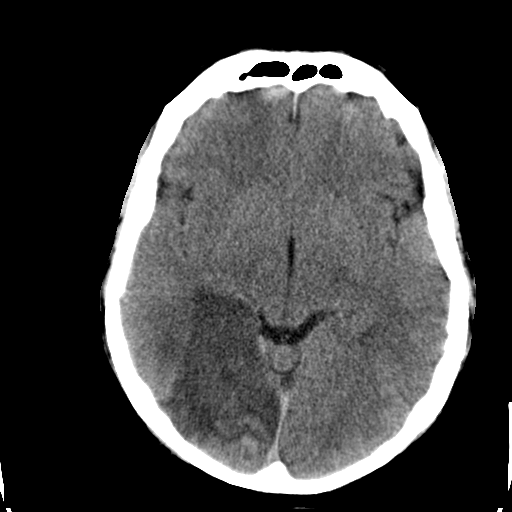
[im 16/32  brain]
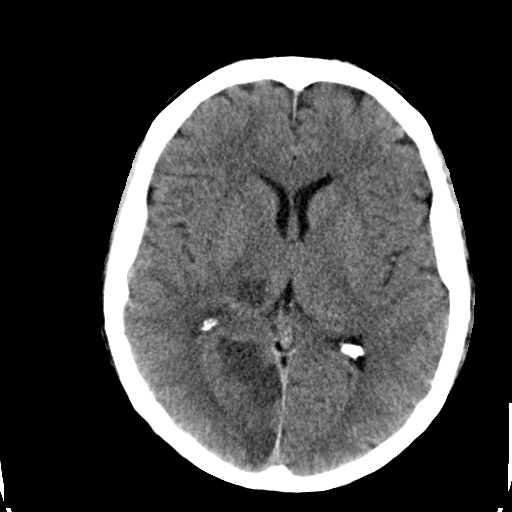
[im 18/32  brain]
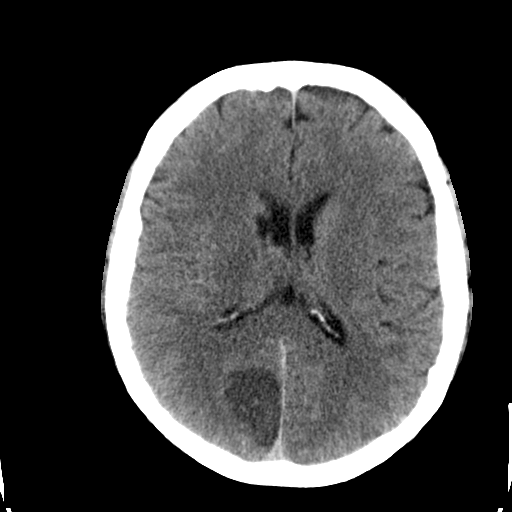
[im 20/32  brain]
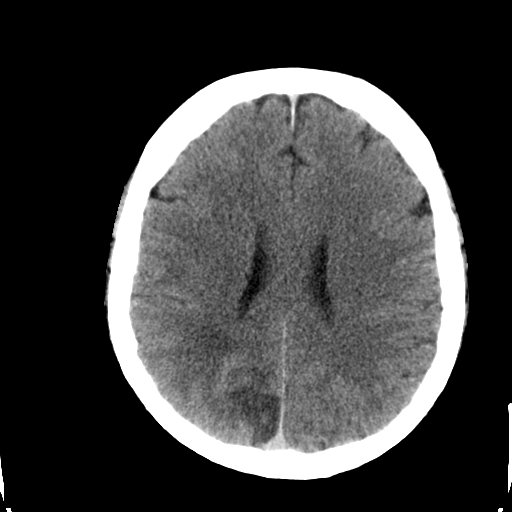
[im 20/32  bone]
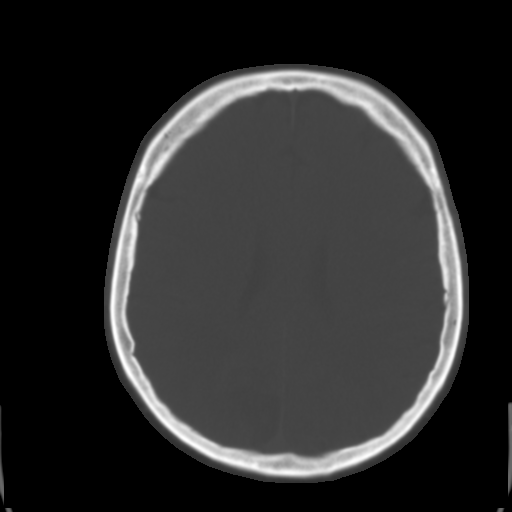
[im 23/32  brain]
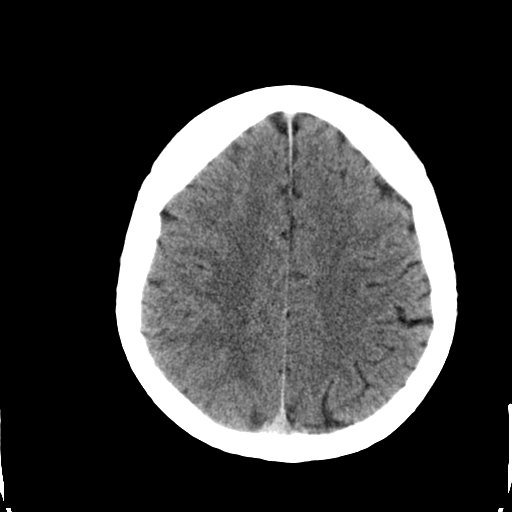
[im 25/32  brain]
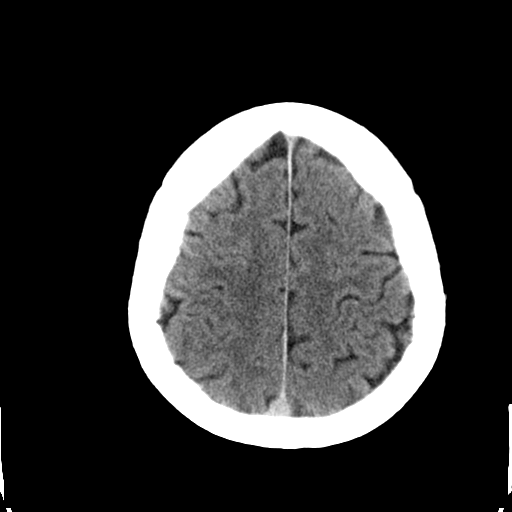
[im 27/32  brain]
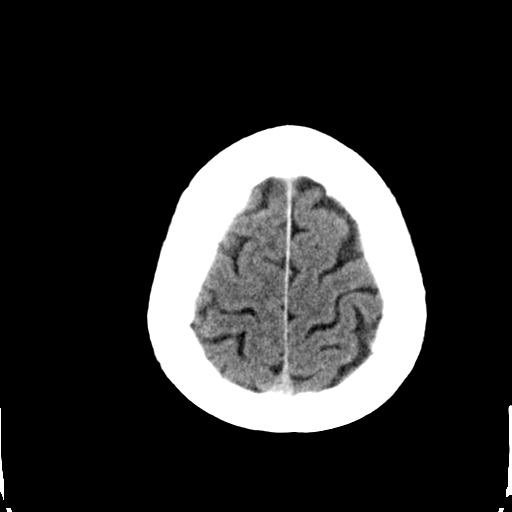
[im 29/32  brain]
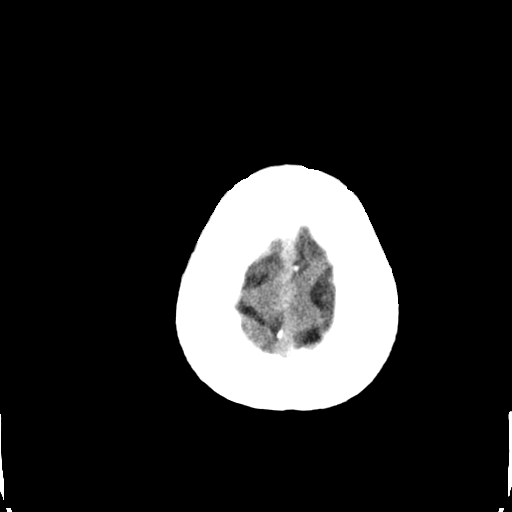
[im 29/32  bone]
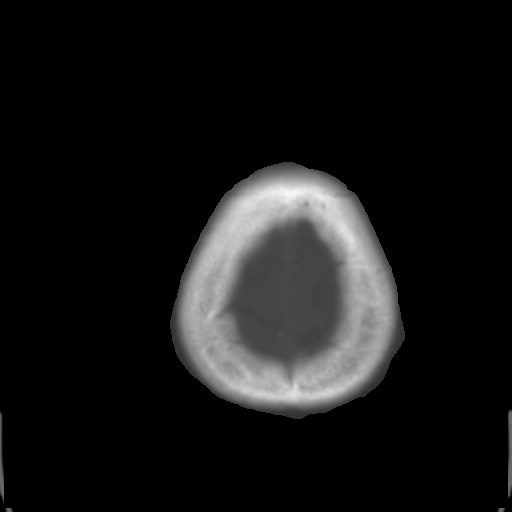

[Series 3: bone · axial · 0.43mm/px · z∈[-166,-146]mm · 2 of 32 slices shown]
[im 3/32  bone]
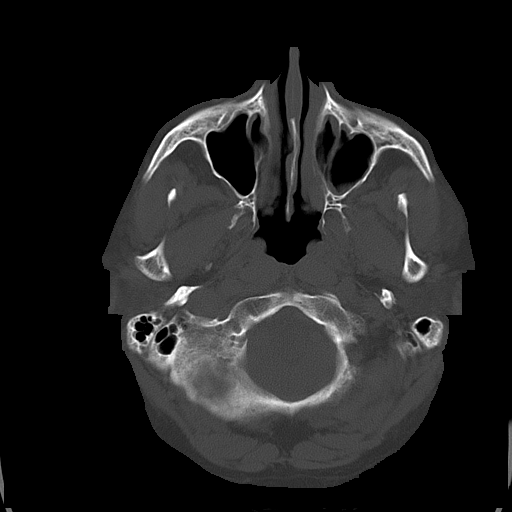
[im 7/32  bone]
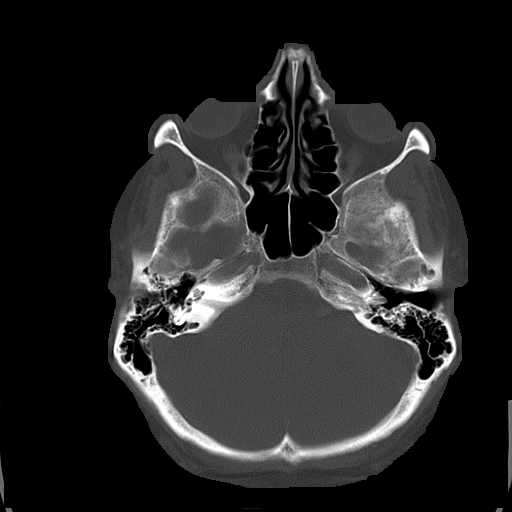

[15 of 30 positions shown; findings below may reference images not displayed]

PROCEDURE:     CT  - CT HEAD WITHOUT CONTRAST  - February 27, 2011 [DATE]

RESULT:     Axial noncontrast CT scanning was performed through the brain
with reconstructions at 5 mm intervals and slice thicknesses. Comparison is
made to the study 26 February, 2011.

The area of infarct in the right posterior parietal and occipital lobes is
better defined today. A small amount of slightly increased density is noted
adjacent to the straight sinus. This likely corresponds to tiny foci of
increased signal seen on T1 MRI images yesterday suggesting petechial
hemorrhage. I do not see significant shift of the midline. The quadrigeminal
plate cistern remains open. Extension of the hypodensity into the posterior
aspect of the right temporal lobe is seen. The pons demonstrates decreased
density fairly diffusely in its lower portion, but this is at least in part
artifactual and is not greatly changed from the previous study. I do not see
abnormality in the left cerebral hemisphere. An old lacunar infarction in
the right caudate nucleus is again demonstrated.
IMPRESSION: There is ongoing evolution of the ischemic infarction in
the posterior right temporal as well as mid and posterior parietal and
occipital lobes on the right. Minimal increased density along the posterior
aspect of the area of infarct may reflect petechial hemorrhage.

## 2013-02-15 ENCOUNTER — Ambulatory Visit: Payer: BC Managed Care – PPO | Admitting: Family Medicine

## 2013-03-01 ENCOUNTER — Encounter: Payer: Self-pay | Admitting: Family Medicine

## 2013-03-01 ENCOUNTER — Ambulatory Visit (INDEPENDENT_AMBULATORY_CARE_PROVIDER_SITE_OTHER): Payer: BC Managed Care – PPO | Admitting: Family Medicine

## 2013-03-01 VITALS — BP 122/70 | HR 68 | Temp 98.2°F | Resp 16 | Ht 70.0 in | Wt 223.6 lb

## 2013-03-01 DIAGNOSIS — D649 Anemia, unspecified: Secondary | ICD-10-CM

## 2013-03-01 DIAGNOSIS — I6350 Cerebral infarction due to unspecified occlusion or stenosis of unspecified cerebral artery: Secondary | ICD-10-CM

## 2013-03-01 DIAGNOSIS — R7309 Other abnormal glucose: Secondary | ICD-10-CM

## 2013-03-01 DIAGNOSIS — I639 Cerebral infarction, unspecified: Secondary | ICD-10-CM

## 2013-03-01 DIAGNOSIS — E7800 Pure hypercholesterolemia, unspecified: Secondary | ICD-10-CM

## 2013-03-01 DIAGNOSIS — K644 Residual hemorrhoidal skin tags: Secondary | ICD-10-CM

## 2013-03-01 DIAGNOSIS — F411 Generalized anxiety disorder: Secondary | ICD-10-CM

## 2013-03-01 LAB — CBC WITH DIFFERENTIAL/PLATELET
Basophils Absolute: 0 10*3/uL (ref 0.0–0.1)
Eosinophils Relative: 2 % (ref 0–5)
Lymphocytes Relative: 30 % (ref 12–46)
MCV: 86.9 fL (ref 78.0–100.0)
Neutro Abs: 4.2 10*3/uL (ref 1.7–7.7)
Neutrophils Relative %: 60 % (ref 43–77)
Platelets: 243 10*3/uL (ref 150–400)
RDW: 13.2 % (ref 11.5–15.5)
WBC: 6.9 10*3/uL (ref 4.0–10.5)

## 2013-03-01 LAB — LIPID PANEL
Cholesterol: 159 mg/dL (ref 0–200)
HDL: 39 mg/dL — ABNORMAL LOW (ref >39)
Total CHOL/HDL Ratio: 4.1 Ratio

## 2013-03-01 LAB — COMPLETE METABOLIC PANEL WITH GFR
ALT: 22 U/L (ref 0–53)
AST: 21 U/L (ref 0–37)
Calcium: 9.4 mg/dL (ref 8.4–10.5)
Chloride: 104 mEq/L (ref 96–112)
Creat: 0.87 mg/dL (ref 0.50–1.35)
Total Bilirubin: 0.8 mg/dL (ref 0.3–1.2)

## 2013-03-01 LAB — HEMOGLOBIN A1C: Mean Plasma Glucose: 123 mg/dL — ABNORMAL HIGH (ref <117)

## 2013-03-01 MED ORDER — CLOPIDOGREL BISULFATE 75 MG PO TABS: 75.0000 mg | ORAL_TABLET | Freq: Every day | ORAL | Status: DC

## 2013-03-01 MED ORDER — ALPRAZOLAM 0.5 MG PO TABS: 0.5000 mg | ORAL_TABLET | Freq: Every evening | ORAL | Status: DC | PRN

## 2013-03-01 MED ORDER — HYDROCORTISONE ACETATE 25 MG RE SUPP: 25.0000 mg | Freq: Two times a day (BID) | RECTAL | Status: DC

## 2013-03-01 NOTE — Progress Notes (Signed)
Subjective:   This chart was scribed for Ethelda Chick, MD by Arlan Organ, ED Scribe. This patient was seen in room Room 22 and the patient's care was started 3:50 PM.    Patient ID: Tyrone Curtis, male    DOB: 12-Mar-1950, 62 y.o.   MRN: [id number]  HPI  HPI Comments: Tyrone Curtis is a 62 y.o. male with a h/o stroke, glucose intolerance, anemia, high cholesterol, and anxiety who presents to [facility] seeking follow up today. He states "this time of year is always bad for him", but he has been doing okay. He reports being by himself for the first time in a number of months, and says he did very well. He says he has been pushing himself, and trying to do more. He states experiences numbness to his right leg. He says he has not been using his cane at all, but he says he feels he needs to push himself. Financially he states him and his wife are doing well. He says "he has a tendency to dwell on things", which typically comes around this time of year.   He recently saw Dr. Clelia Croft on 02/24/13. He says they spoke regarding starting on Plavix in place of his daily .5 aspirin. He reports having his Flu vaccination this year.   Wife states his anxiety regarding "something he puts his mind to", has worsened over the last few months. He reports attempting to exercise his mind more often. He says he has started reading, and writing more. He reports having some issues with his eyes, which is baseline for him. He plans to follow up with his optometrist in the near future. He reports taking half a pill of his anxiety medication at night time, and is considering taking a whole tablet in the morning instead to "help him get the edge off". He denies dysphagia, abdominal pain or CP. He reports following up with Dr. Clelia Croft once a year. He states his energy level is good, and does not have any concerns regarding his iron level.  He reports mild bright red blood from his hemorrhoid when he goes to wipe his bottom after  having a bowel movement. He reports his last episode was yesterday. He says the severity "depends on how deeply he wipes". He says when he bathes himself, he does not experience the bleeding. He says he is unable to feel the hemorrhoid externally.  He reports not having a solid stool in 6 months. He describes it as "loose".  He [place]. He says this happened after eating, and being on his knees cleaning something on the ground. He says as soon as he got up, and took 2 steps, he experienced the dizziness. At that time, his wife states he had not taken his dose of aspirin yet.   Last visit: July 2014 Past Medical History  Diagnosis Date  . CVA (cerebral vascular accident)   . Arthritis   . 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 Erectile dysfunction   . Anxiety state, unspecified   . Regurgitation   . Dysphagia, unspecified(787.20)   . Herpes zoster without mention of complication   . Pure hypercholesterolemia   . Allergic rhinitis, cause unspecified   . Basal cell carcinoma     facial  . Personal history of colonic polyps   . Internal hemorrhoids without mention of complication   . Measles   . Chicken pox   .  Mumps     x 2  . Depression   . Cataract     History   Social History  . Marital Status: Married    Spouse Name: N/A    Number of Children: 1  . Years of Education: N/A   Occupational History  . Disability     CVA  . Previous owner of [employer]    Social History Main Topics  . Smoking status: Former Smoker -- 2.00 packs/day for 25 years    Types: Cigarettes  . Smokeless tobacco: Not on file     Comment: quit in 1984  . Alcohol Use: No     Comment: 3-5 drinks  . Drug Use: No  . Sexual Activity: Not on file   Other Topics Concern  . Not on file   Social History Narrative   Always uses seat belts. Smoke alarm and carbon monoxide detector in home.No Guns. Caffeine: Coffee 5 x  daily.Married x 37 years Happily. Lives with spouse bought a beach house at [employer] in 2011. Also has mountain house in [place]. 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 Exercise: 3 x week walking; 2 miles daily with wife, during winter months in gym-gazelle and stair-master. Pt doing PT 30 - 45 mins per day.    Past Surgical History  Procedure Laterality Date  . Small bowel mass  1991    removed part of small intestine benign mass  . Basal cell carcinoma of anterior chest  2000    resection; [name]  . Cataract surgery  2010    with lens repacement Left  . Small intestine surgery    . Colonoscopy  09/23/12    normal.  Symptoms: anemia, hemoccult+.  [facility].  . Esophagogastroduodenoscopy  09/23/12    normal.  Symptoms: anemia, hemoccult +, GERD.    Review of Systems  Constitutional: Negative for fever, chills, diaphoresis, activity change, appetite change and fatigue.  HENT: Positive for tinnitus.   Eyes: Positive for visual disturbance.  Respiratory: Negative for cough and shortness of breath.   Cardiovascular: Negative for chest pain, palpitations and leg swelling.  Gastrointestinal: Positive for anal bleeding. Negative for nausea, vomiting, abdominal pain and diarrhea.  Endocrine: Negative for cold intolerance, heat intolerance, polydipsia, polyphagia and polyuria.  Skin: Negative for color change, rash and wound.  Neurological: Positive for dizziness and numbness. Negative for tremors, seizures, syncope, facial asymmetry, speech difficulty, weakness, light-headedness and headaches.  Psychiatric/Behavioral: Positive for sleep disturbance. Negative for suicidal ideas, self-injury and dysphoric mood. The patient is nervous/anxious.   All other systems reviewed and are negative.      Objective:   Physical Exam  Nursing note and vitals reviewed. Constitutional: He is oriented to person, place, and time. He appears well-developed and  well-nourished. No distress.  HENT:  Head: Normocephalic and atraumatic.  Right Ear: External ear normal.  Left Ear: External ear normal.  Nose: Nose normal.  Mouth/Throat: Oropharynx is clear and moist.  Eyes: Conjunctivae and EOM are normal. Pupils are equal, round, and reactive to light.  Neck: Normal range of motion. Neck supple. Carotid bruit is not present. No thyromegaly present.  Cardiovascular: Normal rate, regular rhythm, normal heart sounds and intact distal pulses.  Exam reveals no gallop and no friction rub.   No murmur heard. Pulmonary/Chest: Effort normal and breath sounds normal. No respiratory distress. He has no wheezes. He has no rales.  Abdominal: Soft. Bowel sounds are normal. He exhibits no distension  and no mass. There is no tenderness. There is no rebound and no guarding.  Genitourinary: Rectal exam shows external hemorrhoid.  Musculoskeletal: Normal range of motion.  Lymphadenopathy:    He has no cervical adenopathy.  Neurological: He is alert and oriented to person, place, and time. No cranial nerve deficit.  Skin: Skin is warm and dry. No rash noted. He is not diaphoretic.  Psychiatric: He has a normal mood and affect. His behavior is normal. Judgment normal.       Assessment & Plan:  Pure hypercholesterolemia - Plan: CBC with Differential, COMPLETE METABOLIC PANEL WITH GFR, Lipid panel  Other abnormal glucose - Plan: Hemoglobin A1c  CVA (cerebral infarction)  External hemorrhoid, bleeding  Anemia  Anxiety state, unspecified  1.  Hypercholesterolemia: controlled; obtain labs; continue current medications; goal LDL<70 due to CVA hx. 2.  Glucose Intolerance: stable with dietary modification; obtain labs. 3.  CVA hx: stable; persistent L sided paresthesias with mild weakness; loss of peripheral vision; rx for Plavix provided. 4. External hemorrhoid bleeding: New.  rx for anusol HC provided to use bid for one week and then PRN. 5.  Anemia; improved;  repeat today. 6.  Anxiety: worsening in past weeks; refill of Xanax provided but encourage PRN use only.  Meds ordered this encounter  Medications  . clopidogrel (PLAVIX) 75 MG tablet    Sig: Take 1 tablet (75 mg total) by mouth daily.    Dispense:  30 tablet    Refill:  11  . DISCONTD: ALPRAZolam (XANAX) 0.5 MG tablet    Sig: Take 1 tablet (0.5 mg total) by mouth at bedtime as needed.    Dispense:  30 tablet    Refill:  5  . hydrocortisone (ANUSOL-HC) 25 MG suppository    Sig: Place 1 suppository (25 mg total) rectally 2 (two) times daily.    Dispense:  12 suppository    Refill:  2   I personally performed the services described in this documentation, which was scribed in my presence.  The recorded information has been reviewed and is accurate.  Nilda Simmer, M.D.  [facility] [address] [place], [place]  [address] [phone] phone [phone] fax

## 2013-03-10 ENCOUNTER — Encounter: Payer: Self-pay | Admitting: Family Medicine

## 2013-03-17 ENCOUNTER — Encounter: Payer: Self-pay | Admitting: Family Medicine

## 2013-03-17 ENCOUNTER — Telehealth: Payer: Self-pay | Admitting: Radiology

## 2013-03-17 NOTE — Telephone Encounter (Signed)
Called him to advise. Spoke to his wife, she voiced understanding.

## 2013-03-17 NOTE — Telephone Encounter (Signed)
If plavix does not stop ringing in ears, should he just go back to aspirin, does he have to wait before beginning aspirin again? And if he d/c plavix can he go back to reflux medication?

## 2013-03-17 NOTE — Telephone Encounter (Signed)
Call -- 1. I would take Plavix for one entire month to see if ringing in ears will resolve.  2. While taking Plavix, recommend patient stop Omeprazole and take Zantac OTC 150mg  bid for stomach.   You CAN take Omeprazole with Plavix however there are some studies that suggest that Omeprazole will decrease the efficacy of Plavix.  Some patients choose to take Plavix every morning and Omeprazole at bedtime.  However, to be completely safe, he can stop Omeprazole while taking Plavix and take OTC Zantac bid instead.  2. If after one month of taking Plavix, the ringing in his ears is no better, the ringing in his ears is not due to Aspirin therapy; thus, he can stop Plavix and return to full dose Aspirin 325mg  one daily.

## 2013-03-25 ENCOUNTER — Ambulatory Visit (INDEPENDENT_AMBULATORY_CARE_PROVIDER_SITE_OTHER): Payer: BC Managed Care – PPO | Admitting: Family Medicine

## 2013-03-25 VITALS — BP 120/80 | HR 79 | Temp 99.0°F | Resp 18 | Ht 71.5 in | Wt 217.0 lb

## 2013-03-25 DIAGNOSIS — J069 Acute upper respiratory infection, unspecified: Secondary | ICD-10-CM

## 2013-03-25 DIAGNOSIS — R6889 Other general symptoms and signs: Secondary | ICD-10-CM

## 2013-03-25 LAB — POCT CBC
GRANULOCYTE PERCENT: 64.7 % (ref 37–80)
HEMATOCRIT: 50 % (ref 43.5–53.7)
Hemoglobin: 16 g/dL (ref 14.1–18.1)
Lymph, poc: 2.5 (ref 0.6–3.4)
MCH, POC: 30.5 pg (ref 27–31.2)
MCHC: 32 g/dL (ref 31.8–35.4)
MCV: 95.2 fL (ref 80–97)
MID (cbc): 0.7 (ref 0–0.9)
MPV: 11.6 fL (ref 0–99.8)
POC Granulocyte: 6 (ref 2–6.9)
POC LYMPH PERCENT: 27.3 %L (ref 10–50)
POC MID %: 8 %M (ref 0–12)
Platelet Count, POC: 240 10*3/uL (ref 142–424)
RBC: 5.25 M/uL (ref 4.69–6.13)
RDW, POC: 13 %
WBC: 9.3 10*3/uL (ref 4.6–10.2)

## 2013-03-25 LAB — POCT INFLUENZA A/B
INFLUENZA B, POC: NEGATIVE
Influenza A, POC: NEGATIVE

## 2013-03-25 LAB — POCT RAPID STREP A (OFFICE): RAPID STREP A SCREEN: NEGATIVE

## 2013-03-25 MED ORDER — AZITHROMYCIN 250 MG PO TABS: ORAL_TABLET | ORAL | Status: DC

## 2013-03-25 NOTE — Patient Instructions (Signed)

## 2013-03-25 NOTE — Progress Notes (Addendum)
Subjective:    Patient ID: Tyrone Curtis, male    DOB: 1950/06/02, 63 y.o.   MRN: [id number] This chart was scribed for Wardell Honour, MD by Anastasia Pall, ED Scribe. This patient was seen in room 11 and the patient's care was started at 4:44 PM.  Chief Complaint  Patient presents with  . Cough    Mucus with congestion x 5 days    HPI Tyrone Curtis is a 63 y.o. male who presents to the [facility] complaining of flu like symptoms, including an intermittent, productive cough, with associated chest congestion, onset five days ago. He reports over the last few days he has been coughing persistently in the evenings. He reports associated slight fever, diaphoresis, generalized body aches, and headache. He also reports bilateral sore throat, worse on the right side. He states the diaphoresis may have been caused by drinking hot tea, soup. He reports that several people in his family have had similar symptoms. He denies chills, ear pain, SOB, nausea, vomiting, diarrhea, and any other associated symptoms. He reports having loose stools for about 2-3 years, always at the same time of day. He states he is still on Plavix. He reports quitting reflux and acid medication.   PCP Reginia Forts, MD  Patient Active Problem List   Diagnosis Date Noted  . Glucose intolerance (impaired glucose tolerance) 01/06/2014  . Elevated cholesterol 01/06/2014  . Gastroesophageal reflux disease without esophagitis 01/06/2014  . History of CVA (cerebrovascular accident) 01/06/2014  . Insomnia 07/26/2013  . Pure hypercholesterolemia 12/16/2011  . Other abnormal glucose 12/16/2011  . CVA (cerebral infarction) 12/16/2011  . Allergic rhinitis 12/16/2011  . Erectile dysfunction 12/16/2011   Past Medical History  Diagnosis Date  . CVA (cerebral vascular accident)   . Arthritis   . 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 Erectile dysfunction   . Anxiety state, unspecified   .  Regurgitation   . Dysphagia, unspecified(787.20)   . Herpes zoster without mention of complication   . Pure hypercholesterolemia   . Allergic rhinitis, cause unspecified   . Basal cell carcinoma     facial  . Personal history of colonic polyps   . Internal hemorrhoids without mention of complication   . Measles   . Chicken pox   . Mumps     x 2  . Depression   . Cataract    Past Surgical History  Procedure Laterality Date  . Small bowel mass  1991    removed part of small intestine benign mass  . Basal cell carcinoma of anterior chest  2000    resection; Sharlett Iles  . Cataract surgery  2010    with lens repacement Left  . Small intestine surgery    . Colonoscopy  09/23/12    normal.  Symptoms: anemia, hemoccult+.  [facility].  . Esophagogastroduodenoscopy  09/23/12    normal.  Symptoms: anemia, hemoccult +, GERD.   Allergies  Allergen Reactions  . Zyrtec D [Cetirizine-Pseudoephedrine Er]     "talking out of his head"   Prior to Admission medications   Medication Sig Start Date End Date Taking? Authorizing Provider  ALPRAZolam Duanne Moron) 0.5 MG tablet Take 1 tablet (0.5 mg total) by mouth at bedtime as needed. 03/01/13  Yes Wardell Honour, MD  atorvastatin (LIPITOR) 40 MG tablet Take 1 tablet (40 mg total) by mouth daily. 07/20/12  Yes Wardell Honour, MD  clopidogrel (PLAVIX) 75 MG tablet Take 1 tablet (75  mg total) by mouth daily. 03/01/13  Yes Wardell Honour, MD  fluticasone (FLONASE) 50 MCG/ACT nasal spray Place 2 sprays into the nose daily. 12/16/11  Yes Wardell Honour, MD  hydrocortisone (ANUSOL-HC) 25 MG suppository Place 1 suppository (25 mg total) rectally 2 (two) times daily. 03/01/13  Yes Wardell Honour, MD  loratadine (CLARITIN) 10 MG tablet Take 10 mg by mouth daily.   Yes Historical Provider, MD  Multiple Vitamin (MULTIVITAMINS PO) Take by mouth daily.   Yes Historical Provider, MD  aspirin 325 MG EC tablet Take 81 mg by mouth daily.     Historical Provider, MD    omeprazole (PRILOSEC) 20 MG capsule Take 1 capsule (20 mg total) by mouth daily. 07/20/12   Wardell Honour, MD    Review of Systems  Constitutional: Positive for fever and diaphoresis. Negative for chills and appetite change.  HENT: Positive for congestion and sore throat. Negative for ear pain.   Respiratory: Positive for cough. Negative for shortness of breath and wheezing.   Gastrointestinal: Negative for nausea, vomiting and diarrhea.  Musculoskeletal: Positive for myalgias (body aches).  Neurological: Positive for headaches.      Objective:   Physical Exam  Nursing note and vitals reviewed. Constitutional: He is oriented to person, place, and time. He appears well-developed and well-nourished. No distress.  HENT:  Head: Normocephalic and atraumatic.  Right Ear: Tympanic membrane, external ear and ear canal normal.  Left Ear: Tympanic membrane, external ear and ear canal normal.  Nose: Nose normal.  Mouth/Throat: Uvula is midline and mucous membranes are normal. Posterior oropharyngeal erythema (diffuse) present. No oropharyngeal exudate or posterior oropharyngeal edema.  Eyes: EOM are normal.  Neck: Neck supple. No thyromegaly present.  Cardiovascular: Normal rate, regular rhythm and normal heart sounds.   No murmur heard. Pulmonary/Chest: Effort normal and breath sounds normal. No respiratory distress. He has no wheezes. He has no rales.  Musculoskeletal: Normal range of motion.  Lymphadenopathy:    He has cervical adenopathy (mild anterior).  Neurological: He is alert and oriented to person, place, and time.  Skin: Skin is warm and dry.  Psychiatric: He has a normal mood and affect. His behavior is normal.    BP 120/80  Pulse 79  Temp(Src) 99 F (37.2 C) (Oral)  Resp 18  Ht 5' 11.5" (1.816 m)  Wt 217 lb (98.431 kg)  BMI 29.85 kg/m2  SpO2 96%  5:00 PM - Obtained flu swab and throat culture for screening. Will order blood work. Discussed with pt he doesn't need CXR,  and pt complied.    Results for orders placed or performed in visit on 03/25/13  Culture, Group A Strep  Result Value Ref Range   Organism ID, Bacteria Normal Upper Respiratory Flora    Organism ID, Bacteria No Beta Hemolytic Streptococci Isolated   POCT CBC  Result Value Ref Range   WBC 9.3 4.6 - 10.2 K/uL   Lymph, poc 2.5 0.6 - 3.4   POC LYMPH PERCENT 27.3 10 - 50 %L   MID (cbc) 0.7 0 - 0.9   POC MID % 8.0 0 - 12 %M   POC Granulocyte 6.0 2 - 6.9   Granulocyte percent 64.7 37 - 80 %G   RBC 5.25 4.69 - 6.13 M/uL   Hemoglobin 16.0 14.1 - 18.1 g/dL   HCT, POC 50.0 43.5 - 53.7 %   MCV 95.2 80 - 97 fL   MCH, POC 30.5 27 - 31.2 pg  MCHC 32.0 31.8 - 35.4 g/dL   RDW, POC 13.0 %   Platelet Count, POC 240 142 - 424 K/uL   MPV 11.6 0 - 99.8 fL  POCT Influenza A/B  Result Value Ref Range   Influenza A, POC Negative    Influenza B, POC Negative   POCT rapid strep A  Result Value Ref Range   Rapid Strep A Screen Negative Negative       Assessment & Plan:   1. Flu-like symptoms     1. Flu like symptoms:  New.  Persistent symptoms and worsening on day five of illness.  Rx for Robitussin with codeine cough syrup; if no improvement in 72 hours, advised to start Zithromax. Supportive care with rest, fluids, Tylenol.   Meds ordered this encounter  Medications  . DISCONTD: azithromycin (ZITHROMAX) 250 MG tablet    Sig: Two tablets daily x 1 day then one tablet daily x 4 days    Dispense:  6 each    Refill:  0  . guaiFENesin-codeine 100-10 MG/5ML syrup    Sig: Take 5-10 mLs by mouth every 6 (six) hours as needed for cough.    Dispense:  240 mL    Refill:  0    Meds ordered this encounter  Medications  . DISCONTD: azithromycin (ZITHROMAX) 250 MG tablet    Sig: Two tablets daily x 1 day then one tablet daily x 4 days    Dispense:  6 each    Refill:  0  . guaiFENesin-codeine 100-10 MG/5ML syrup    Sig: Take 5-10 mLs by mouth every 6 (six) hours as needed for cough.     Dispense:  240 mL    Refill:  0   I personally performed the services described in this documentation, which was scribed in my presence.  The recorded information has been reviewed and is accurate.  Reginia Forts, M.D.  [facility] [address] [place], [place]  [address] [phone] phone [phone] fax

## 2013-03-26 ENCOUNTER — Other Ambulatory Visit: Payer: Self-pay | Admitting: Family Medicine

## 2013-03-26 NOTE — Telephone Encounter (Signed)
Dr Tamala Julian, you have recently seen pt but not for this med. Can we RF? I have pended it w/year of RFs from last check up

## 2013-03-27 LAB — CULTURE, GROUP A STREP: ORGANISM ID, BACTERIA: NORMAL

## 2013-03-29 ENCOUNTER — Telehealth: Payer: Self-pay

## 2013-03-29 MED ORDER — GUAIFENESIN-CODEINE 100-10 MG/5ML PO SOLN: 5.0000 mL | Freq: Four times a day (QID) | ORAL | Status: DC | PRN

## 2013-03-29 NOTE — Telephone Encounter (Signed)
Patient's wife Tommy Goostree is calling to check on the status of his cough syrup being sent in for him.    Best#: [phone]

## 2013-03-29 NOTE — Telephone Encounter (Signed)
See labs 

## 2013-03-30 ENCOUNTER — Telehealth: Payer: Self-pay

## 2013-03-30 NOTE — Telephone Encounter (Signed)
Patients wife is calling saying that there was supposed to be rx of his cough medication at the pharmacy and it is still not there please call patient at [phone]

## 2013-03-31 ENCOUNTER — Telehealth: Payer: Self-pay | Admitting: *Deleted

## 2013-03-31 NOTE — Telephone Encounter (Signed)
Was able to call rx into pharmacy.  LM to advise pt

## 2013-03-31 NOTE — Telephone Encounter (Signed)
FYI.  Pt prescription was found on the front desk. Apologized to pt for the confusion. We have the script here and pt needs to pick up due to the codeine. Wife states to destroy the script. She is not able to take time and drive here just to pick up a piece of paper. She was very angry. I apologized for the confusion and advised her to have him try Delsym for his cough. She stated she will try this.

## 2013-03-31 NOTE — Telephone Encounter (Signed)
Was Robitussin with codeine able to be called into pharmacy?

## 2013-04-01 NOTE — Telephone Encounter (Signed)
We can no longer fax or call in because it has codeine in it

## 2013-04-02 NOTE — Telephone Encounter (Signed)
Codeine is not a scheduled II controlled substance.  I believe that Ronaldo Miyamoto called this rx into pharmacy?

## 2013-04-02 NOTE — Telephone Encounter (Signed)
I am so sorry this can be called in and Judson Roch did call that in and it has been taken care of

## 2013-04-02 NOTE — Telephone Encounter (Signed)
Yes this was taken care of.  Spoke to Mrs. Barajas apologized again for the confusion. She did go to the pharmacy and pick up the cough syrup.  Pt is still coughing up a lot of phlegm. The cough syrup is working at night. She states he is not getting worse at this time and hasn't any fevers. Advised pt rtc if he does develop a fever or symptoms worsen. Gave her Dr. Tamala Julian clinic hours for next week if she needs to RTC.

## 2013-04-25 ENCOUNTER — Encounter: Payer: Self-pay | Admitting: Family Medicine

## 2013-07-21 ENCOUNTER — Ambulatory Visit: Payer: Self-pay | Admitting: Ophthalmology

## 2013-07-26 ENCOUNTER — Ambulatory Visit (INDEPENDENT_AMBULATORY_CARE_PROVIDER_SITE_OTHER): Payer: Medicare Other | Admitting: Family Medicine

## 2013-07-26 ENCOUNTER — Encounter: Payer: Self-pay | Admitting: Family Medicine

## 2013-07-26 VITALS — BP 120/70 | HR 63 | Temp 97.9°F | Resp 16 | Ht 70.5 in | Wt 213.0 lb

## 2013-07-26 DIAGNOSIS — K219 Gastro-esophageal reflux disease without esophagitis: Secondary | ICD-10-CM

## 2013-07-26 DIAGNOSIS — I639 Cerebral infarction, unspecified: Secondary | ICD-10-CM

## 2013-07-26 DIAGNOSIS — Z0000 Encounter for general adult medical examination without abnormal findings: Secondary | ICD-10-CM

## 2013-07-26 DIAGNOSIS — I6350 Cerebral infarction due to unspecified occlusion or stenosis of unspecified cerebral artery: Secondary | ICD-10-CM

## 2013-07-26 DIAGNOSIS — J309 Allergic rhinitis, unspecified: Secondary | ICD-10-CM

## 2013-07-26 DIAGNOSIS — R7309 Other abnormal glucose: Secondary | ICD-10-CM

## 2013-07-26 DIAGNOSIS — G4700 Insomnia, unspecified: Secondary | ICD-10-CM

## 2013-07-26 DIAGNOSIS — E7800 Pure hypercholesterolemia, unspecified: Secondary | ICD-10-CM

## 2013-07-26 DIAGNOSIS — Z125 Encounter for screening for malignant neoplasm of prostate: Secondary | ICD-10-CM

## 2013-07-26 LAB — CBC WITH DIFFERENTIAL/PLATELET
BASOS ABS: 0 10*3/uL (ref 0.0–0.1)
Basophils Relative: 0 % (ref 0–1)
Eosinophils Absolute: 0.1 10*3/uL (ref 0.0–0.7)
Eosinophils Relative: 2 % (ref 0–5)
HCT: 45.4 % (ref 39.0–52.0)
Hemoglobin: 15.7 g/dL (ref 13.0–17.0)
LYMPHS PCT: 30 % (ref 12–46)
Lymphs Abs: 2.1 10*3/uL (ref 0.7–4.0)
MCH: 29.8 pg (ref 26.0–34.0)
MCHC: 34.6 g/dL (ref 30.0–36.0)
MCV: 86.1 fL (ref 78.0–100.0)
Monocytes Absolute: 0.6 10*3/uL (ref 0.1–1.0)
Monocytes Relative: 8 % (ref 3–12)
NEUTROS ABS: 4.3 10*3/uL (ref 1.7–7.7)
NEUTROS PCT: 60 % (ref 43–77)
PLATELETS: 237 10*3/uL (ref 150–400)
RBC: 5.27 MIL/uL (ref 4.22–5.81)
RDW: 13.6 % (ref 11.5–15.5)
WBC: 7.1 10*3/uL (ref 4.0–10.5)

## 2013-07-26 LAB — POCT URINALYSIS DIPSTICK
Bilirubin, UA: NEGATIVE
Blood, UA: NEGATIVE
Glucose, UA: NEGATIVE
Ketones, UA: NEGATIVE
Leukocytes, UA: NEGATIVE
Nitrite, UA: NEGATIVE
PROTEIN UA: NEGATIVE
SPEC GRAV UA: 1.01
UROBILINOGEN UA: 0.2
pH, UA: 7

## 2013-07-26 LAB — LIPID PANEL
Cholesterol: 144 mg/dL (ref 0–200)
HDL: 37 mg/dL — AB (ref >39)
LDL CALC: 67 mg/dL (ref 0–99)
TRIGLYCERIDES: 201 mg/dL — AB (ref <150)
Total CHOL/HDL Ratio: 3.9 Ratio
VLDL: 40 mg/dL (ref 0–40)

## 2013-07-26 LAB — COMPLETE METABOLIC PANEL WITH GFR
ALK PHOS: 74 U/L (ref 39–117)
ALT: 18 U/L (ref 0–53)
AST: 20 U/L (ref 0–37)
Albumin: 4.3 g/dL (ref 3.5–5.2)
BUN: 8 mg/dL (ref 6–23)
CO2: 26 mEq/L (ref 19–32)
Calcium: 9.7 mg/dL (ref 8.4–10.5)
Chloride: 104 mEq/L (ref 96–112)
Creat: 1 mg/dL (ref 0.50–1.35)
GFR, Est African American: >89 mL/min
GFR, Est Non African American: 79 mL/min
GLUCOSE: 99 mg/dL (ref 70–99)
POTASSIUM: 4.8 meq/L (ref 3.5–5.3)
Sodium: 140 mEq/L (ref 135–145)
TOTAL PROTEIN: 7.1 g/dL (ref 6.0–8.3)
Total Bilirubin: 1 mg/dL (ref 0.2–1.2)

## 2013-07-26 LAB — HEMOGLOBIN A1C
Hgb A1c MFr Bld: 5.8 % — ABNORMAL HIGH (ref <5.7)
Mean Plasma Glucose: 120 mg/dL — ABNORMAL HIGH (ref <117)

## 2013-07-26 LAB — TSH: TSH: 3.373 u[IU]/mL (ref 0.350–4.500)

## 2013-07-26 MED ORDER — FLUTICASONE PROPIONATE 50 MCG/ACT NA SUSP: 2.0000 | Freq: Every day | NASAL | Status: DC

## 2013-07-26 MED ORDER — OMEPRAZOLE 20 MG PO CPDR: 20.0000 mg | DELAYED_RELEASE_CAPSULE | Freq: Every day | ORAL | Status: DC

## 2013-07-26 MED ORDER — HYDROXYZINE HCL 25 MG PO TABS: 12.5000 mg | ORAL_TABLET | Freq: Every evening | ORAL | Status: DC | PRN

## 2013-07-26 MED ORDER — ALPRAZOLAM 0.5 MG PO TABS: 0.5000 mg | ORAL_TABLET | Freq: Every evening | ORAL | Status: DC | PRN

## 2013-07-26 MED ORDER — ATORVASTATIN CALCIUM 40 MG PO TABS: 40.0000 mg | ORAL_TABLET | Freq: Every day | ORAL | Status: DC

## 2013-07-26 NOTE — Progress Notes (Signed)
Subjective:    Patient ID: Tyrone Curtis, male    DOB: 01/21/1951, 63 y.o.   MRN: [id number]  HPI Chief Complaint  Patient presents with  . Annual Exam  . Medication Refill    This chart was scribed for Tyrone Honour, MD by Tyrone Curtis, ED Scribe. This patient was seen in room 29 and the patient's care was started at 12:20 PM.  HPI Comments: Tyrone Curtis is a 63 y.o. male h/o CVA, glucose intolerance, hyperlipidemia, who presents to the [facility] here for a physical. Pt recently had cataract surgery in left eye 3 days ago. Pt has been practicing his driving since surgery and often drives during short trips. Pt has loss 4lb since last visit. He reports he and significant other have been watching what they eat. He reports only eating 2 meals a day but denies feeling hungry. Pt is concerned he may have a hiatal hernia. Pt reports he often gets food stuck in his throat. Pt denies GERD. Pt reports tinnitus. Pt takes half aspirin. He no longer takes plavix. Pt reports numbness on left side of his body but this is not a new problem and reports no changes in numbness since CVA.   Pt denies CP, palpitations, cough, SOB, bowel incontinence, nausea and emesis. Pt reports 1-2 BM daily. Pt reports urinating once a night. Pt has trouble sleeping and often unable to sleep through the entire night. Per significant other pt snores at night. Pt takes a half of xanax at night for this issue. Pt has not receiving shingle vaccine. He reports his sister currently has shingles. Pt denies issues with allergies. He has been using flonase less. He is concerned that flonase causes halitosis. Pt denies sores in mouth. Pt has an appointment with his dentist next month.   Pt is retired.   Pt last physical was 07/20/12.  Colonoscopy 09/2012.  tdap 2013.  Zostavax never. Flu vaccines 2014.  Past Medical History  Diagnosis Date  . CVA (cerebral vascular accident)   . Arthritis   .  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 Erectile dysfunction   . Anxiety state, unspecified   . Regurgitation   . Dysphagia, unspecified(787.20)   . Herpes zoster without mention of complication   . Pure hypercholesterolemia   . Allergic rhinitis, cause unspecified   . Basal cell carcinoma     facial  . Personal history of colonic polyps   . Internal hemorrhoids without mention of complication   . Measles   . Chicken pox   . Mumps     x 2  . Depression   . Cataract    Allergies  Allergen Reactions  . Zyrtec D [Cetirizine-Pseudoephedrine Er]     "talking out of his head"   Prior to Admission medications   Medication Sig Start Date End Date Taking? Authorizing Provider  ALPRAZolam Duanne Moron) 0.5 MG tablet Take 1 tablet (0.5 mg total) by mouth at bedtime as needed. 03/01/13  Yes Tyrone Honour, MD  aspirin 325 MG EC tablet Take 81 mg by mouth daily.    Yes Historical Provider, MD  atorvastatin (LIPITOR) 40 MG tablet Take 1 tablet (40 mg total) by mouth daily. 07/20/12  Yes Tyrone Honour, MD  fluticasone [facility]) 50 MCG/ACT nasal spray USE 2 SPRAYS EACH NOSTRIL DAILY 03/26/13  Yes Tyrone Honour, MD  loratadine (CLARITIN) 10 MG tablet Take 10 mg by mouth daily.  Yes Historical Provider, MD  Multiple Vitamin (MULTIVITAMINS PO) Take by mouth daily.   Yes Historical Provider, MD  omeprazole (PRILOSEC) 20 MG capsule Take 1 capsule (20 mg total) by mouth daily. 07/20/12  Yes Tyrone Honour, MD  azithromycin (ZITHROMAX) 250 MG tablet Two tablets daily x 1 day then one tablet daily x 4 days 03/25/13   Tyrone Honour, MD  clopidogrel (PLAVIX) 75 MG tablet Take 1 tablet (75 mg total) by mouth daily. 03/01/13   Tyrone Honour, MD  guaiFENesin-codeine 100-10 MG/5ML syrup Take 5-10 mLs by mouth every 6 (six) hours as needed for cough. 03/29/13   Tyrone Honour, MD  hydrocortisone (ANUSOL-HC) 25 MG suppository Place 1 suppository (25 mg total) rectally 2 (two) times daily.  03/01/13   Tyrone Honour, MD   Review of Systems  Constitutional: Negative for fever, chills, diaphoresis, activity change, appetite change, fatigue and unexpected weight change.  HENT: Positive for tinnitus and trouble swallowing. Negative for congestion, dental problem, drooling, ear discharge, ear pain, facial swelling, hearing loss, mouth sores, nosebleeds, postnasal drip, rhinorrhea, sinus pressure, sneezing, sore throat and voice change.   Eyes: Negative for photophobia, pain, discharge, redness, itching and visual disturbance.  Respiratory: Negative for apnea, cough, choking, chest tightness, shortness of breath, wheezing and stridor.   Cardiovascular: Negative for chest pain, palpitations and leg swelling.  Gastrointestinal: Negative for nausea, vomiting, abdominal pain, diarrhea, constipation and blood in stool.  Endocrine: Negative for cold intolerance, heat intolerance, polydipsia, polyphagia and polyuria.  Genitourinary: Negative for dysuria, urgency, frequency, hematuria, flank pain, decreased urine volume, discharge, penile swelling, scrotal swelling, enuresis, difficulty urinating, genital sores, penile pain and testicular pain.  Musculoskeletal: Negative for myalgias, back pain, joint swelling, arthralgias, gait problem, neck pain and neck stiffness.  Skin: Negative for color change, pallor, rash and wound.  Allergic/Immunologic: Negative for environmental allergies, food allergies and immunocompromised state.  Neurological: Negative for dizziness, tremors, seizures, syncope, facial asymmetry, speech difficulty, weakness, light-headedness, numbness and headaches.  Hematological: Negative for adenopathy. Does not bruise/bleed easily.  Psychiatric/Behavioral: Negative for suicidal ideas, hallucinations, behavioral problems, confusion, sleep disturbance, self-injury, dysphoric mood, decreased concentration and agitation. The patient is not nervous/anxious and is not hyperactive.         Objective:   Physical Exam  Constitutional: He is oriented to person, place, and time. He appears well-developed and well-nourished. No distress.  HENT:  Head: Normocephalic and atraumatic.  Right Ear: External ear normal.  Left Ear: External ear normal.  Nose: Nose normal.  Mouth/Throat: Oropharynx is clear and moist.  Eyes: Conjunctivae and EOM are normal. Pupils are equal, round, and reactive to light.  Neck: Normal range of motion. Neck supple. Carotid bruit is not present. No thyromegaly present.  Cardiovascular: Normal rate, regular rhythm, normal heart sounds and intact distal pulses.  Exam reveals no gallop and no friction rub.   No murmur heard. Pulmonary/Chest: Effort normal and breath sounds normal. No respiratory distress. He has no wheezes. He has no rales. He exhibits no tenderness.  Abdominal: Soft. Bowel sounds are normal. He exhibits no distension and no mass. There is no tenderness. There is no rebound and no guarding. Hernia confirmed negative in the right inguinal area and confirmed negative in the left inguinal area.  Genitourinary: Rectum normal, prostate normal, testes normal and penis normal. Right testis shows no mass, no swelling and no tenderness. Left testis shows no mass, no swelling and no tenderness.  Musculoskeletal: Normal range of motion.  Right shoulder: Normal.       Left shoulder: Normal.       Cervical back: Normal.  Lymphadenopathy:    He has no cervical adenopathy.  Neurological: He is alert and oriented to person, place, and time. He has normal reflexes. No cranial nerve deficit. He exhibits normal muscle tone. Coordination normal.  Skin: Skin is warm and dry. No rash noted. He is not diaphoretic.  Psychiatric: He has a normal mood and affect. His behavior is normal. Judgment and thought content normal.  Nursing note and vitals reviewed.     Assessment & Plan:   1. Routine general medical examination at a health care facility   2.  Special screening for malignant neoplasm of prostate   3. Pure hypercholesterolemia   4. Other abnormal glucose   5. CVA (cerebral infarction)   6. Allergic rhinitis   7. Insomnia     1. Complete Physical Examination: anticipatory guidance --- weight loss, exercise.  Colonoscopy UTD.  Immunizations reviewed; recommend contacting insurance company regarding Zostavax coverage.   2.  Screening prostate cancer: DRE performed; PSA obtained. 3.  Hypercholesterolemia: controlled; obtain labs; refill provided. 4.  Glucose intolerance: stable; continue with dietary modification; obtain labs. 5.  CVA hx: stable; residual L sided weakness and paresthesias and loss of peripheral vision.  Continue ASA high dose.   6. Allergic Rhinitis: controlled; refills provided. 7. Insomnia: chronic; rx for Atarax provided to try.   Meds ordered this encounter  Medications  . atorvastatin (LIPITOR) 40 MG tablet    Sig: Take 1 tablet (40 mg total) by mouth daily.    Dispense:  30 tablet    Refill:  11  . fluticasone (FLONASE) 50 MCG/ACT nasal spray    Sig: Place 2 sprays into both nostrils daily.    Dispense:  16 g    Refill:  10  . DISCONTD: omeprazole (PRILOSEC) 20 MG capsule    Sig: Take 1 capsule (20 mg total) by mouth daily.    Dispense:  30 capsule    Refill:  11  . DISCONTD: ALPRAZolam (XANAX) 0.5 MG tablet    Sig: Take 1 tablet (0.5 mg total) by mouth at bedtime as needed.    Dispense:  30 tablet    Refill:  5  . hydrOXYzine (ATARAX/VISTARIL) 25 MG tablet    Sig: Take 0.5-1 tablets (12.5-25 mg total) by mouth at bedtime as needed.    Dispense:  30 tablet    Refill:  0    I personally performed the services described in this documentation, which was scribed in my presence.  The recorded information has been reviewed and is accurate.  Reginia Forts, M.D.  [facility] [address] [place], [place]  [address] [phone] phone [phone] fax

## 2013-07-26 NOTE — Progress Notes (Signed)
   Subjective:    Patient ID: Tyrone Curtis, male    DOB: [birth year], 63 y.o.   MRN: [id number]  HPI    Review of Systems  Constitutional: Negative.   HENT: Positive for hearing loss, sinus pressure and sneezing.   Eyes: Positive for photophobia.  Respiratory: Positive for choking.   Cardiovascular: Negative.   Gastrointestinal: Negative.   Endocrine: Negative.   Genitourinary: Negative.   Musculoskeletal: Positive for arthralgias.  Skin: Negative.   Allergic/Immunologic: Positive for environmental allergies.  Neurological: Positive for dizziness and numbness.  Hematological: Negative.   Psychiatric/Behavioral: Negative.        Objective:   Physical Exam        Assessment & Plan:

## 2013-07-27 LAB — PSA, MEDICARE: PSA: 1.05 ng/mL (ref <=4.00)

## 2013-07-29 ENCOUNTER — Encounter: Payer: Self-pay | Admitting: Family Medicine

## 2013-08-11 ENCOUNTER — Telehealth: Payer: Self-pay

## 2013-08-11 NOTE — Telephone Encounter (Signed)
PA needed for alprazolam. Completed form on covermymeds.

## 2013-08-12 ENCOUNTER — Telehealth: Payer: Self-pay

## 2013-08-12 NOTE — Telephone Encounter (Signed)
Sherry from [employer] called to let nurse know that pt is approved for prizram (s/p?)

## 2013-08-13 NOTE — Telephone Encounter (Signed)
PA approved. Notified pharm.

## 2013-08-13 NOTE — Telephone Encounter (Signed)
Notified pharm. See also other message about PA.

## 2014-01-03 ENCOUNTER — Ambulatory Visit (INDEPENDENT_AMBULATORY_CARE_PROVIDER_SITE_OTHER): Payer: Medicare Other | Admitting: Family Medicine

## 2014-01-03 ENCOUNTER — Encounter: Payer: Self-pay | Admitting: Family Medicine

## 2014-01-03 VITALS — BP 110/78 | HR 86 | Temp 97.8°F | Resp 16 | Ht 70.5 in | Wt 204.4 lb

## 2014-01-03 DIAGNOSIS — E7800 Pure hypercholesterolemia, unspecified: Secondary | ICD-10-CM

## 2014-01-03 DIAGNOSIS — Z23 Encounter for immunization: Secondary | ICD-10-CM

## 2014-01-03 DIAGNOSIS — Z8673 Personal history of transient ischemic attack (TIA), and cerebral infarction without residual deficits: Secondary | ICD-10-CM

## 2014-01-03 DIAGNOSIS — J0100 Acute maxillary sinusitis, unspecified: Secondary | ICD-10-CM

## 2014-01-03 DIAGNOSIS — R7302 Impaired glucose tolerance (oral): Secondary | ICD-10-CM

## 2014-01-03 DIAGNOSIS — K219 Gastro-esophageal reflux disease without esophagitis: Secondary | ICD-10-CM

## 2014-01-03 LAB — CBC WITH DIFFERENTIAL/PLATELET
BASOS ABS: 0 10*3/uL (ref 0.0–0.1)
BASOS PCT: 0 % (ref 0–1)
EOS ABS: 0.1 10*3/uL (ref 0.0–0.7)
Eosinophils Relative: 1 % (ref 0–5)
HCT: 46.3 % (ref 39.0–52.0)
Hemoglobin: 15.9 g/dL (ref 13.0–17.0)
LYMPHS ABS: 2 10*3/uL (ref 0.7–4.0)
Lymphocytes Relative: 27 % (ref 12–46)
MCH: 30.6 pg (ref 26.0–34.0)
MCHC: 34.3 g/dL (ref 30.0–36.0)
MCV: 89 fL (ref 78.0–100.0)
Monocytes Absolute: 0.6 10*3/uL (ref 0.1–1.0)
Monocytes Relative: 8 % (ref 3–12)
NEUTROS PCT: 64 % (ref 43–77)
Neutro Abs: 4.8 10*3/uL (ref 1.7–7.7)
Platelets: 250 10*3/uL (ref 150–400)
RBC: 5.2 MIL/uL (ref 4.22–5.81)
RDW: 13 % (ref 11.5–15.5)
WBC: 7.5 10*3/uL (ref 4.0–10.5)

## 2014-01-03 LAB — COMPLETE METABOLIC PANEL WITH GFR
ALK PHOS: 75 U/L (ref 39–117)
ALT: 18 U/L (ref 0–53)
AST: 22 U/L (ref 0–37)
Albumin: 4.4 g/dL (ref 3.5–5.2)
BILIRUBIN TOTAL: 1.1 mg/dL (ref 0.2–1.2)
BUN: 7 mg/dL (ref 6–23)
CO2: 27 meq/L (ref 19–32)
Calcium: 9.6 mg/dL (ref 8.4–10.5)
Chloride: 105 mEq/L (ref 96–112)
Creat: 0.97 mg/dL (ref 0.50–1.35)
GFR, Est African American: >89 mL/min
GFR, Est Non African American: 82 mL/min
Glucose, Bld: 87 mg/dL (ref 70–99)
Potassium: 4.7 mEq/L (ref 3.5–5.3)
SODIUM: 139 meq/L (ref 135–145)
Total Protein: 7.4 g/dL (ref 6.0–8.3)

## 2014-01-03 LAB — LIPID PANEL
CHOL/HDL RATIO: 3.4 ratio
Cholesterol: 151 mg/dL (ref 0–200)
HDL: 44 mg/dL (ref >39)
LDL CALC: 87 mg/dL (ref 0–99)
TRIGLYCERIDES: 102 mg/dL (ref <150)
VLDL: 20 mg/dL (ref 0–40)

## 2014-01-03 LAB — HEMOGLOBIN A1C
Hgb A1c MFr Bld: 5.6 % (ref <5.7)
Mean Plasma Glucose: 114 mg/dL (ref <117)

## 2014-01-03 MED ORDER — AMOXICILLIN 500 MG PO TABS: 1000.0000 mg | ORAL_TABLET | Freq: Two times a day (BID) | ORAL | Status: DC

## 2014-01-03 MED ORDER — ZOSTER VACCINE LIVE 19400 UNT/0.65ML ~~LOC~~ SOLR: 0.6500 mL | Freq: Once | SUBCUTANEOUS | Status: DC

## 2014-01-03 MED ORDER — OMEPRAZOLE 40 MG PO CPDR: 40.0000 mg | DELAYED_RELEASE_CAPSULE | Freq: Every day | ORAL | Status: DC

## 2014-01-03 MED ORDER — ALPRAZOLAM 0.5 MG PO TABS: 0.5000 mg | ORAL_TABLET | Freq: Every evening | ORAL | Status: DC | PRN

## 2014-01-03 NOTE — Progress Notes (Signed)
Subjective:    Patient ID: Tyrone Curtis, male    DOB: September 29, 1950, 63 y.o.   MRN: [id number] This chart was scribed for Reginia Forts, MD by Marti Sleigh, Medical Scribe. This patient was seen in Room 22 and the patient's care was started at 12:30 PM.  HPI HPI Comments: Tyrone Curtis is a 63 y.o. male who presents to [facility] for six month follow-up:  1.  History of CVA:  Fall is a bad time of year for patient.    2.  L sinus pain: flonase helps.  Nose is very sensitive to touch.  Flonase helps.  L sided numbness facial which is unusual.  Using Flonase regularly.  Pain started 6 weeks.  No rash.  3.  Hyperlipidemia:  4.  Early satiety:  Has conitnued over the past six months.  Had problems with GERD lately; had a different PPI; switched to [employer];     Review of Systems     Objective:   Physical Exam  Nursing note and vitals reviewed. Constitutional: He is oriented to person, place, and time. He appears well-developed and well-nourished.  HENT:  Head: Normocephalic and atraumatic.  Eyes: Pupils are equal, round, and reactive to light.  Neck: No JVD present.  Cardiovascular: Normal rate and regular rhythm.   Pulmonary/Chest: Effort normal and breath sounds normal. No respiratory distress.  Neurological: He is alert and oriented to person, place, and time.  Skin: Skin is warm and dry.  Psychiatric: He has a normal mood and affect. His behavior is normal.          Assessment & Plan:  Glucose intolerance (impaired glucose tolerance) - Plan: CBC with Differential, COMPLETE METABOLIC PANEL WITH GFR, Hemoglobin A1c  Elevated cholesterol - Plan: CBC with Differential, COMPLETE METABOLIC PANEL WITH GFR, Lipid panel, Hemoglobin A1c  Need for prophylactic vaccination and inoculation against influenza - Plan: Flu Vaccine QUAD 36+ mos IM  Acute maxillary sinusitis, recurrence not specified - Plan: amoxicillin (AMOXIL) 500 MG tablet, DISCONTINUED:  amoxicillin (AMOXIL) 500 MG tablet  Gastroesophageal reflux disease without esophagitis - Plan: omeprazole (PRILOSEC) 40 MG capsule, DISCONTINUED: omeprazole (PRILOSEC) 40 MG capsule  Need for shingles vaccine - Plan: zoster vaccine live, PF, (ZOSTAVAX) 13086 UNT/0.65ML injection  Meds ordered this encounter  Medications  . DISCONTD: amoxicillin (AMOXIL) 500 MG tablet    Sig: Take 2 tablets (1,000 mg total) by mouth 2 (two) times daily.    Dispense:  60 tablet    Refill:  0  . DISCONTD: omeprazole (PRILOSEC) 40 MG capsule    Sig: Take 1 capsule (40 mg total) by mouth daily.    Dispense:  30 capsule    Refill:  11  . zoster vaccine live, PF, (ZOSTAVAX) 57846 UNT/0.65ML injection    Sig: Inject 19,400 Units into the skin once.    Dispense:  0.65 mL    Refill:  0  . ALPRAZolam (XANAX) 0.5 MG tablet    Sig: Take 1 tablet (0.5 mg total) by mouth at bedtime as needed.    Dispense:  30 tablet    Refill:  3  . omeprazole (PRILOSEC) 40 MG capsule    Sig: Take 1 capsule (40 mg total) by mouth daily.    Dispense:  30 capsule    Refill:  11  . amoxicillin (AMOXIL) 500 MG tablet    Sig: Take 2 tablets (1,000 mg total) by mouth 2 (two) times daily.    Dispense:  60 tablet  Refill:  0    Reginia Forts, M.D.  [facility] [address] [place], [place]  [address] [phone] phone [phone] fax

## 2014-01-06 DIAGNOSIS — R7302 Impaired glucose tolerance (oral): Secondary | ICD-10-CM | POA: Insufficient documentation

## 2014-01-06 DIAGNOSIS — K219 Gastro-esophageal reflux disease without esophagitis: Secondary | ICD-10-CM | POA: Insufficient documentation

## 2014-01-06 DIAGNOSIS — E785 Hyperlipidemia, unspecified: Secondary | ICD-10-CM | POA: Insufficient documentation

## 2014-01-06 DIAGNOSIS — I69354 Hemiplegia and hemiparesis following cerebral infarction affecting left non-dominant side: Secondary | ICD-10-CM | POA: Insufficient documentation

## 2014-01-20 ENCOUNTER — Encounter: Payer: Self-pay | Admitting: Family Medicine

## 2014-01-20 ENCOUNTER — Ambulatory Visit (INDEPENDENT_AMBULATORY_CARE_PROVIDER_SITE_OTHER): Payer: Medicare Other | Admitting: Family Medicine

## 2014-01-20 VITALS — BP 130/84 | HR 70 | Temp 98.0°F | Resp 16 | Ht 70.25 in | Wt 205.6 lb

## 2014-01-20 DIAGNOSIS — J0120 Acute ethmoidal sinusitis, unspecified: Secondary | ICD-10-CM

## 2014-01-20 DIAGNOSIS — R51 Headache: Secondary | ICD-10-CM

## 2014-01-20 DIAGNOSIS — R519 Headache, unspecified: Secondary | ICD-10-CM

## 2014-01-20 DIAGNOSIS — Z8673 Personal history of transient ischemic attack (TIA), and cerebral infarction without residual deficits: Secondary | ICD-10-CM

## 2014-01-20 MED ORDER — DOXYCYCLINE HYCLATE 100 MG PO CAPS: 100.0000 mg | ORAL_CAPSULE | Freq: Two times a day (BID) | ORAL | Status: DC

## 2014-01-20 NOTE — Patient Instructions (Signed)
1. Purchase nasal saline spray (like Ocean Mist); spray 2 sprays into nose twice daily.

## 2014-01-20 NOTE — Progress Notes (Signed)
Subjective:  This chart was scribed for Reginia Forts, MD by Mercy Moore, Medial Scribe. This patient was seen in room 5 and the patient's care was started at 5:03 PM.    Patient ID: Tyrone Curtis, male    DOB: 01-12-1951, 63 y.o.   MRN: [id number]  01/20/2014  Sinus Problem   HPI HPI Comments: SMILEY BIRR is a 63 y.o. male who presents to the [facility] complaining of unresolved sinusitis symptoms. Per email message sent earlier today, patient reports finishing course of Amoxicillin (three days ago) with some improvement for 1-2 days. However, patient reports return of pain when blowing and touching his nose. Patient reports continued use of Flonase nasal spray which only provides temporary relief. Patient reports L eye pain, eye watering/drainage of L eye, and mild photophobia. Patient's eye pain is exacerbated with applied pressure and he reports increased pain this morning when eating "sausage balls." Patient denies dizziness, blurred vision, rhinorrhea, ear pain or coughing.  +mild nasal congestion.   Patient shares that when his eye pain and nose pain presented, patient initially visited his dentist, but his teeth were normal.  Patient's wife reports treatment with warm compresses over his eye which did provide some temporary relief. No rash. Pt suffers with chronic numbness along L face due to previous CVA.     Review of Systems  Constitutional: Negative for fever and chills.  HENT: Positive for congestion and sinus pressure. Negative for dental problem, drooling, ear discharge, ear pain, facial swelling, hearing loss, mouth sores, nosebleeds, postnasal drip, rhinorrhea, sneezing, sore throat, tinnitus, trouble swallowing and voice change.   Eyes: Positive for photophobia and discharge. Negative for pain and visual disturbance.  Respiratory: Negative for cough.   Skin: Negative for color change, pallor, rash and wound.  Neurological: Positive for  numbness. Negative for dizziness, tremors, syncope, speech difficulty, weakness, light-headedness and headaches.    Past Medical History  Diagnosis Date  . CVA (cerebral vascular accident)   . Arthritis   . 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 Erectile dysfunction   . Anxiety state, unspecified   . Regurgitation   . Dysphagia, unspecified(787.20)   . Herpes zoster without mention of complication   . Pure hypercholesterolemia   . Allergic rhinitis, cause unspecified   . Basal cell carcinoma     facial  . Personal history of colonic polyps   . Internal hemorrhoids without mention of complication   . Measles   . Chicken pox   . Mumps     x 2  . Depression   . Cataract    Past Surgical History  Procedure Laterality Date  . Small bowel mass  1991    removed part of small intestine benign mass  . Basal cell carcinoma of anterior chest  2000    resection; [facility]  . Cataract surgery  2010    with lens repacement Left  . Small intestine surgery    . Colonoscopy  09/23/12    normal.  Symptoms: anemia, hemoccult+.  [facility].  . Esophagogastroduodenoscopy  09/23/12    normal.  Symptoms: anemia, hemoccult +, GERD.   Allergies  Allergen Reactions  . Zyrtec D [Cetirizine-Pseudoephedrine Er]     "talking out of his head"   Current Outpatient Prescriptions  Medication Sig Dispense Refill  . ALPRAZolam (XANAX) 0.5 MG tablet Take 1 tablet (0.5 mg total) by mouth at bedtime as needed. 30 tablet 3  . aspirin  325 MG EC tablet Take 81 mg by mouth daily.     Marland Kitchen atorvastatin (LIPITOR) 40 MG tablet Take 1 tablet (40 mg total) by mouth daily. 30 tablet 11  . clopidogrel (PLAVIX) 75 MG tablet Take 1 tablet (75 mg total) by mouth daily. 30 tablet 11  . fluticasone (FLONASE) 50 MCG/ACT nasal spray Place 2 sprays into both nostrils daily. 16 g 10  . guaiFENesin-codeine 100-10 MG/5ML syrup Take 5-10 mLs by mouth every 6 (six) hours as needed for cough. 240  mL 0  . hydrocortisone (ANUSOL-HC) 25 MG suppository Place 1 suppository (25 mg total) rectally 2 (two) times daily. 12 suppository 2  . hydrOXYzine (ATARAX/VISTARIL) 25 MG tablet Take 0.5-1 tablets (12.5-25 mg total) by mouth at bedtime as needed. 30 tablet 0  . loratadine (CLARITIN) 10 MG tablet Take 10 mg by mouth daily.    . Multiple Vitamin (MULTIVITAMINS PO) Take by mouth daily.    Marland Kitchen omeprazole (PRILOSEC) 40 MG capsule Take 1 capsule (40 mg total) by mouth daily. 30 capsule 11  . zoster vaccine live, PF, (ZOSTAVAX) 62376 UNT/0.65ML injection Inject 19,400 Units into the skin once. 0.65 mL 0  . amoxicillin (AMOXIL) 500 MG tablet Take 2 tablets (1,000 mg total) by mouth 2 (two) times daily. 60 tablet 0  . doxycycline (VIBRAMYCIN) 100 MG capsule Take 1 capsule (100 mg total) by mouth 2 (two) times daily. 30 capsule 0   No current facility-administered medications for this visit.       Objective:    Triage Vitals: BP 130/84 mmHg  Pulse 70  Temp(Src) 98 F (36.7 C) (Oral)  Resp 16  Ht 5' 10.25" (1.784 m)  Wt 205 lb 9.6 oz (93.26 kg)  BMI 29.30 kg/m2  SpO2 95% Physical Exam  Constitutional: He is oriented to person, place, and time. He appears well-developed and well-nourished. No distress.  HENT:  Head: Normocephalic and atraumatic.  Right Ear: External ear normal.  Left Ear: External ear normal.  Nose: Mucosal edema, rhinorrhea and sinus tenderness present. No nose lacerations, nasal deformity, septal deviation or nasal septal hematoma. No epistaxis.  No foreign bodies. Right sinus exhibits no maxillary sinus tenderness and no frontal sinus tenderness. Left sinus exhibits frontal sinus tenderness. Left sinus exhibits no maxillary sinus tenderness.  Mouth/Throat: Oropharynx is clear and moist. No oropharyngeal exudate.  Tender to palpation on left eyebrow and left lateral nare with compression. Moderate white, yellow drainage in left nare and none the in the right No ulcerative  lesions in the nare.   Eyes: EOM are normal.  Neck: Normal range of motion. Neck supple. No tracheal deviation present.  Cardiovascular: Normal rate, regular rhythm and normal heart sounds.   No murmur heard. Pulmonary/Chest: Effort normal. No respiratory distress. He has no wheezes. He has no rales.  Musculoskeletal: Normal range of motion.  Lymphadenopathy:    He has no cervical adenopathy.  Neurological: He is alert and oriented to person, place, and time. No cranial nerve deficit. He exhibits normal muscle tone. Coordination normal.  Skin: Skin is warm and dry. No rash noted. He is not diaphoretic. No erythema.  Psychiatric: He has a normal mood and affect. His behavior is normal.  Nursing note and vitals reviewed.       Assessment & Plan:   1. Acute ethmoidal sinusitis, recurrence not specified   2. Left facial pain   3. History of CVA (cerebrovascular accident)      1. Acute frontal sinusitis:  Persistent; s/p  Amoxicillin for ten days; rx for Doxycycline provided to treat MRSA as well.  Continue Flonase; continue oral antihistamine/Zyrtec daily. Recommend nasal saline spray tid to L nare.  If no improvement with Doxycycline, obtain CT sinuses; pt to call with update upon completion of Doxycycline. 2. L facial pain: New.  Consistent with acute sinusitis.  Treat sinusitis; if pain persists, will obtain CT sinuses.    Meds ordered this encounter  Medications  . doxycycline (VIBRAMYCIN) 100 MG capsule    Sig: Take 1 capsule (100 mg total) by mouth 2 (two) times daily.    Dispense:  30 capsule    Refill:  0    No Follow-up on file.    I personally performed the services described in this documentation, which was scribed in my presence. The recorded information has been reviewed and is accurate.  Reginia Forts, M.D.  [facility] [address] [place], [place]  [address] [phone] phone [phone] fax

## 2014-01-31 ENCOUNTER — Ambulatory Visit: Payer: BC Managed Care – PPO | Admitting: Family Medicine

## 2014-01-31 ENCOUNTER — Telehealth: Payer: Self-pay | Admitting: *Deleted

## 2014-01-31 DIAGNOSIS — R519 Headache, unspecified: Secondary | ICD-10-CM

## 2014-01-31 DIAGNOSIS — R51 Headache: Principal | ICD-10-CM

## 2014-01-31 NOTE — Telephone Encounter (Signed)
Patient's wife called and states that he has almost taken all of the medication that we prescribed him & he is not feeling any better.  Please call [name] @ [phone]

## 2014-01-31 NOTE — Telephone Encounter (Signed)
Pt is still in pain- can he be referred to have a CT Scan done and have some additional abx called in or do you want pt to RTC?

## 2014-02-01 MED ORDER — LEVOFLOXACIN 750 MG PO TABS: 750.0000 mg | ORAL_TABLET | Freq: Every day | ORAL | Status: DC

## 2014-02-01 NOTE — Telephone Encounter (Signed)
1.  I have placed order for CT sinuses for [facility] in [place].  Please coordinate this to be scheduled this week.  2.  Please advise patient that CT has been ordered.  3. I have sent a prescription for Levaquin to pt's pharmacy.

## 2014-02-01 NOTE — Telephone Encounter (Signed)
Cll pt's wife  - [phone], she confirmed receiving call regarding CT and Levaquin being electronically sent to pt's pharmacy.

## 2014-02-02 ENCOUNTER — Ambulatory Visit: Payer: Self-pay | Admitting: Family Medicine

## 2014-02-02 ENCOUNTER — Telehealth: Payer: Self-pay | Admitting: Physician Assistant

## 2014-02-02 NOTE — Telephone Encounter (Signed)
Call Report of CT maxillofacial with contrast read by Dr. Oneita Jolly. Faxed report to follow.  Mild bilateral maxillary sinus disease. No periorbital or orbital cellulitis.

## 2014-02-07 NOTE — Telephone Encounter (Signed)
Patient is calling in regards to CT results

## 2014-02-08 NOTE — Telephone Encounter (Signed)
1. Please call [facility] for CT report; I have not received it yet.  2.  Please call patient --- CT sinuses shows bilateral maxillary sinus infection mild (per call report to Harrison Mons, PA-C on 02/02/14).  Recommend completing Levaquin. How is L sided facial pain?

## 2014-02-08 NOTE — Telephone Encounter (Signed)
Forehead and nose are still sensitive to touch. He has been having pain behind his left eye. He has 3 days left on the medication. Pt has been using Tylenolol, Netti rinses. He feels like it is getting better. His nose is getting clear.

## 2014-02-10 MED ORDER — PREDNISONE 20 MG PO TABS: ORAL_TABLET | ORAL | Status: DC

## 2014-02-10 NOTE — Telephone Encounter (Signed)
Call --- if patient is not significantly improved after treatment with Levaquin, recommend course of Prednisone to decrease inflammation in sinuses.  Has he taken prednisone before?

## 2014-02-10 NOTE — Telephone Encounter (Signed)
Pt.notified

## 2014-02-10 NOTE — Telephone Encounter (Signed)
Rx for Prednisone sent to pharmacy.

## 2014-02-10 NOTE — Telephone Encounter (Signed)
He has not tried prednisone before. He is still having pain.  Pt would like to give this a try- changed pharmacy to one closer to where pt is currently vacationing.

## 2014-02-21 ENCOUNTER — Encounter: Payer: Self-pay | Admitting: Family Medicine

## 2014-02-22 ENCOUNTER — Encounter: Payer: Self-pay | Admitting: Family Medicine

## 2014-02-23 ENCOUNTER — Encounter: Payer: Self-pay | Admitting: Family Medicine

## 2014-03-01 ENCOUNTER — Encounter: Payer: Self-pay | Admitting: Family Medicine

## 2014-03-09 ENCOUNTER — Encounter: Payer: Self-pay | Admitting: Family Medicine

## 2014-03-09 ENCOUNTER — Ambulatory Visit (INDEPENDENT_AMBULATORY_CARE_PROVIDER_SITE_OTHER): Payer: Medicare Other | Admitting: Family Medicine

## 2014-03-09 VITALS — BP 122/78 | HR 62 | Temp 97.9°F | Resp 16 | Ht 70.5 in | Wt 205.2 lb

## 2014-03-09 DIAGNOSIS — R634 Abnormal weight loss: Secondary | ICD-10-CM

## 2014-03-09 DIAGNOSIS — H9193 Unspecified hearing loss, bilateral: Secondary | ICD-10-CM

## 2014-03-09 DIAGNOSIS — H9313 Tinnitus, bilateral: Secondary | ICD-10-CM

## 2014-03-09 DIAGNOSIS — R519 Headache, unspecified: Secondary | ICD-10-CM

## 2014-03-09 DIAGNOSIS — H5712 Ocular pain, left eye: Secondary | ICD-10-CM

## 2014-03-09 DIAGNOSIS — R51 Headache: Secondary | ICD-10-CM

## 2014-03-09 DIAGNOSIS — J0100 Acute maxillary sinusitis, unspecified: Secondary | ICD-10-CM

## 2014-03-09 NOTE — Progress Notes (Signed)
Subjective:    Patient ID: PRINTICE HELLMER, male    DOB: 1950-07-19, 63 y.o.   MRN: [id number]  03/09/2014  Sinus Problem   HPI This 63 y.o. male presents for six week follow-up of L sinus/facial pain.  Minimal improvement in L facial/nasal/periorbital pain since last visit.  S/p evaluation by Dr. Murvin Natal on 02/23/14; regular review/check-up.  S/p dilated eye exam WNL. No identifiable cause to symptoms.   Dr. Areatha Keas the week before that evaluated area in 01/2014; no dental etiology to facial pain identifed.   S/p neurology evaluation by Dr. Brigitte Pulse; he reviewed CT scan sinus; no neurological etiology to L facial pain; released patient from care from previous CVA; follow-up PRN.    S/p Amoxicillin, Doxycycline, Levaquin, Prednisone since last visit. S/p CT sinuses which revealed mild maxillary sinusitis B.  Yesterday eye was puffy and red; applied warm compress with improvement.  +eye is watery.  Eye symptoms are new; this developed over past week.  Sensitive to light for past week.  +drainage from L eye with an odor.  No conjunctiva redness today but did occur yesterday.  No FB sensation; no pain with blinking.  No vision changes or blurred vision.  Applied artificial tears to L eye in past month with horrible stinging/burning so stopped using drops.      Nose is no longer sensitive to touch as it was six weeks ago on exam.  Last round of Levaquin with Prednisone was weird due to increase in appetite and anxiety; nose sensitivity did improve with therapy.  Rolling over at night causes pain when puts pressure on L frontal region.  Pain worse in morning and improves during the day.  Uses Netti Pot every morning with distilled water which relieves L frontal sinus pressure.  Blowing nose in morning after Netti Pot uses relieves pressure.  Chewing does worsen pain during the day and was especially bad yesterday.  Does suffer with PND every morning.  Stopped using Flonase within past month  due to severe burning in L nare only with use.  Using nasal saline every other day at this time.  L facial pain is constant but is worse in morning upon awakening and with chewing.     2. Tinnitus B:  Ringing in ears is worsening.  Associated hearing loss.  B tinnitus.  Chronic ongoing issue that is very frustrating to patient.   No previous ENT evaluation.  Did hold ASA without improvement.   3.  Weight loss: was to return in January 2016 for weight check; weight is unchanged; has been watching weight. Feels the best at 200.  Does not want to gain weight.  Appetite is normal but patient very diligent to control appetite.  No n/v/d; no abdominal pain.    Review of Systems  Constitutional: Negative for fever, chills, diaphoresis, activity change, appetite change, fatigue and unexpected weight change.  HENT: Positive for congestion, facial swelling, hearing loss, postnasal drip, rhinorrhea, sinus pressure and tinnitus. Negative for dental problem, ear discharge, ear pain, mouth sores, nosebleeds, sneezing, sore throat, trouble swallowing and voice change.   Eyes: Positive for photophobia, pain, discharge and redness. Negative for itching and visual disturbance.  Respiratory: Negative for cough and shortness of breath.   Skin: Negative for color change and rash.  Neurological: Positive for headaches. Negative for dizziness, tremors, seizures, syncope, facial asymmetry, speech difficulty, weakness and numbness.    Past Medical History  Diagnosis Date  . CVA (cerebral vascular accident)   .  Arthritis   . 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 Erectile dysfunction   . Anxiety state, unspecified   . Regurgitation   . Dysphagia, unspecified(787.20)   . Herpes zoster without mention of complication   . Pure hypercholesterolemia   . Allergic rhinitis, cause unspecified   . Basal cell carcinoma     facial  . Personal history of colonic polyps   . Internal  hemorrhoids without mention of complication   . Measles   . Chicken pox   . Mumps     x 2  . Depression   . Cataract    Past Surgical History  Procedure Laterality Date  . Small bowel mass  1991    removed part of small intestine benign mass  . Basal cell carcinoma of anterior chest  2000    resection; [facility]  . Cataract surgery  2010    with lens repacement Left  . Small intestine surgery    . Colonoscopy  09/23/12    normal.  Symptoms: anemia, hemoccult+.  [facility].  . Esophagogastroduodenoscopy  09/23/12    normal.  Symptoms: anemia, hemoccult +, GERD.   Allergies  Allergen Reactions  . Zyrtec D [Cetirizine-Pseudoephedrine Er]     "talking out of his head"   Current Outpatient Prescriptions  Medication Sig Dispense Refill  . ALPRAZolam (XANAX) 0.5 MG tablet Take 1 tablet (0.5 mg total) by mouth at bedtime as needed. 30 tablet 3  . aspirin 325 MG EC tablet Take 81 mg by mouth daily.     Marland Kitchen atorvastatin (LIPITOR) 40 MG tablet Take 1 tablet (40 mg total) by mouth daily. 30 tablet 11  . doxycycline (VIBRAMYCIN) 100 MG capsule Take 1 capsule (100 mg total) by mouth 2 (two) times daily. 30 capsule 0  . fluticasone (FLONASE) 50 MCG/ACT nasal spray Place 2 sprays into both nostrils daily. 16 g 10  . guaiFENesin-codeine 100-10 MG/5ML syrup Take 5-10 mLs by mouth every 6 (six) hours as needed for cough. 240 mL 0  . hydrocortisone (ANUSOL-HC) 25 MG suppository Place 1 suppository (25 mg total) rectally 2 (two) times daily. 12 suppository 2  . hydrOXYzine (ATARAX/VISTARIL) 25 MG tablet Take 0.5-1 tablets (12.5-25 mg total) by mouth at bedtime as needed. 30 tablet 0  . loratadine (CLARITIN) 10 MG tablet Take 10 mg by mouth daily.    . Multiple Vitamin (MULTIVITAMINS PO) Take by mouth daily.    Marland Kitchen omeprazole (PRILOSEC) 40 MG capsule Take 1 capsule (40 mg total) by mouth daily. 30 capsule 11  . amoxicillin (AMOXIL) 500 MG tablet Take 2 tablets (1,000 mg total) by mouth 2 (two)  times daily. (Patient not taking: Reported on 03/09/2014) 60 tablet 0  . clopidogrel (PLAVIX) 75 MG tablet Take 1 tablet (75 mg total) by mouth daily. (Patient not taking: Reported on 03/09/2014) 30 tablet 11  . levofloxacin (LEVAQUIN) 750 MG tablet Take 1 tablet (750 mg total) by mouth daily. (Patient not taking: Reported on 03/09/2014) 10 tablet 0  . predniSONE (DELTASONE) 20 MG tablet Two tablets daily x 5 days then one tablet daily x 5 days (Patient not taking: Reported on 03/09/2014) 15 tablet 0  . zoster vaccine live, PF, (ZOSTAVAX) 51700 UNT/0.65ML injection Inject 19,400 Units into the skin once. 0.65 mL 0   No current facility-administered medications for this visit.       Objective:    BP 122/78 mmHg  Pulse 62  Temp(Src) 97.9 F (36.6 C) (Oral)  Resp 16  Ht 5' 10.5" (1.791 m)  Wt 205 lb 3.2 oz (93.078 kg)  BMI 29.02 kg/m2  SpO2 98% Physical Exam  Constitutional: He is oriented to person, place, and time. He appears well-developed and well-nourished. No distress.  HENT:  Head: Normocephalic and atraumatic.  Right Ear: External ear normal.  Left Ear: External ear normal.  Nose: Mucosal edema, rhinorrhea and sinus tenderness present. No nose lacerations or nasal septal hematoma. No epistaxis.  No foreign bodies. Right sinus exhibits no maxillary sinus tenderness and no frontal sinus tenderness. Left sinus exhibits no maxillary sinus tenderness and no frontal sinus tenderness.  Mouth/Throat: Oropharynx is clear and moist. No oropharyngeal exudate.  L lateral nare TTP.  Eyes: Conjunctivae, EOM and lids are normal. Pupils are equal, round, and reactive to light. Right conjunctiva is not injected. Right conjunctiva has no hemorrhage. Left conjunctiva is not injected. Left conjunctiva has no hemorrhage.  Neck: Normal range of motion. Neck supple. Carotid bruit is not present. No thyromegaly present.  Cardiovascular: Normal rate, regular rhythm, normal heart sounds and intact distal  pulses.  Exam reveals no gallop and no friction rub.   No murmur heard. Pulmonary/Chest: Effort normal and breath sounds normal. He has no wheezes. He has no rales.  Lymphadenopathy:    He has no cervical adenopathy.  Neurological: He is alert and oriented to person, place, and time. No cranial nerve deficit.  Skin: Skin is warm and dry. No rash noted. He is not diaphoretic.  Psychiatric: He has a normal mood and affect. His behavior is normal.  Nursing note and vitals reviewed.       Assessment & Plan:   1. Left facial pain   2. Left eye pain   3. Tinnitus, bilateral   4. Weight loss, unintentional   5. Subacute maxillary sinusitis   6. Hearing loss, bilateral      1. L facial pain:  Persistent; now pain mostly along L periorbital region but suffering with irritation of L nare and L eye region.  Refer to ENT for full evaluation.  S/p neurology evaluation, ophthalmology evaluation, and dental evaluation with negative findings.  Ddx at this time includes L frontal sinus inflammation versus trigeminal neuralgia.   2.  L eye pain: New.  Now with periorbital pain with drainage from L eye yet conjunctiva normal today.  Restart artificial tears, Flonase, and increase nasal saline to daily use.  Continue [employer]. If eye symptoms worsen, return to ophthalmology. 3.  Subacute maxillary sinusitis: New.  Detected by CT sinuses; s/p Amoxicillin, Doxycycline, Levaquin, Prednisone.   4.  Tinnitus B: worsening; refer to ENT.  Associated with hearing loss. 5.  Weight loss unintentional: stable and now appears intentional in nature.    No orders of the defined types were placed in this encounter.    No Follow-up on file.     Shanitra Phillippi Elayne Guerin, M.D. [facility] [address] [place], [place]  [address] [phone] phone [phone] fax

## 2014-03-09 NOTE — Patient Instructions (Signed)
1.  Use netti pot every morning. 2.  Use nasal saline every evening. 3. Use artificial tears to L eye twice daily. 4.  Use Flonase on R side at bedtime.

## 2014-03-21 ENCOUNTER — Ambulatory Visit: Payer: Medicare Other | Admitting: Family Medicine

## 2014-07-03 NOTE — Consult Note (Signed)
PATIENT NAME:  Tyrone Curtis, Tyrone Curtis MR#:  [id number] DATE OF BIRTH:  Apr 19, 1950  DATE OF CONSULTATION:  02/26/2011  PRIMARY CARE PHYSICIAN:  None. REFERRING PHYSICIAN:  Dr. Holley Raring CONSULTING PHYSICIAN:  Nickol Collister K. Manuella Ghazi, MD  REASON FOR CONSULTATION: Sudden weakness.   HISTORY OF PRESENT ILLNESS: Tyrone Curtis is a 64 year old Caucasian gentleman who woke up around 3:00 a.m. on 02/26/2011.  He felt like he was numb on the left side of his face, arm, and leg, and he was not able to move the left side of his body. He also had some loss of vision on the left side.   For the last one week or so the patient had felt like his vision was not quite right; he had black dots in front of his eyes. He was seen by an optometrist who thought that he had loss of vision in the left side of both of his eyes and thought of brain as possible cause.  The patient denied any chest pain or shortness of breath or any deep vein thrombosis.   The patient was not taking aspirin for the last two years. He does not have known hypertension, diabetes, or hyperlipidemia. He used to smoke more than 20 years ago.   He does have a family history of diabetes. He does have a family history of bilateral hearing loss, which the patient also has.   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   PAST SURGICAL HISTORY: As above.   SOCIAL HISTORY: He does not use tobacco, alcohol, or any other drugs. He works on Environmental [employer].   REVIEW OF SYSTEMS: Positive for left-sided weakness and numbness and loss of vision on the left side, occasional shortness of breath.     He also had some headache, had some nausea early on.   PHYSICAL EXAMINATION:  VITAL SIGNS: Temperature 98.7, pulse 75, respiratory rate 18, blood pressure 145/83, pulse oximetry 96% on room air.    LUNGS:  Clear to auscultation.   HEART: S1 and S2 heart sounds. Carotids did not reveal any bruit.   NEUROLOGIC: He was alert. He was oriented. He knew today's day, Tuesday, December 2012. Date-wise he thought it was the 13th or 14th and the date is the 18th. He knew the current President Obama, previous [employer] and he had a hard time coming up with the previous president named Clinton.  He was able to follow two-step inverted commands but made a mistake the first time.   His attention and concentration were appropriate. He did have some mild left-sided neglect.   On his cranial nerves, his pupils were equal, round, and reactive. He has pseudophakia on the left. His extraocular movements were intact. He has a dense left homonymous hemianopsia.   His left face was a little bit weak. He has decreased sensation to light touch on his left face. His  tongue was midline. He has decreased hearing bilaterally.   On his motor examination he has a slightly decreased tone in his left upper and lower extremity.   His strength is actually good but he has a very poor dexterity in his left upper extremity.   He keeps his left wrist hyperextended and fist closed.   His left lower extremity does not have external rotation spontaneously.   He does have a good strength in the lower extremity as well,  but he has poor dexterity in control of his left lower extremity.   His sensations were decreased to light touch on his left upper and lower extremity, chest, and  abdomen.    His deep tendon reflexes were symmetric. He has mild hyperreflexia on the left and upgoing toe on the left.   I did not check his gait.   LABORATORY, DIAGNOSTIC, AND RADIOLOGICAL DATA: On his MRI of the brain, he does have a huge infarct in his right posterior cerebral artery territory, infarct involving the medial temporal lobe and part of thalamus and medial occipital lobe.   He has left PCA occlusion as well.   On his T-1  weighted images he had some hyperintensities suggestive of some reperfusion hemorrhage.   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   He does not have many risk factors other than positive family history and potential obstructive sleep apnea. He was not on any antiplatelets for last two years.  I agree with Dr. Holley Raring on starting aspirin 325 mg p.o. daily. He should be on a statin and reasonable blood pressure control allowing peri-stroke hypertension.   Avoid any increase or decrease in temperature.   Avoid any hypoglycemia or hyperglycemic episodes.   I had an extensive discussion with the patient and family regarding what to expect in the future.   In terms of the potential hemorrhage on his infarct area likely represent reperfusion injury. ?   It would be a good idea to repeat CT scan of the head in 24 hours to make sure that he is not having any swelling.    I talked to the family about signs and symptoms to look out for for increased intracranial tension.   In terms of his headache, it would not be a good idea to give him any narcotic pain medications so we do not change level of alertness.   As an outpatient he can be considered for sleep study and Holter monitor to look for paroxysmal atrial fibrillation.   I will see him on an infrequent basis. Feel free to contact me with any further questions.    ____________________________ Royetta Crochet. Manuella Ghazi, MD hks:bjt D: 02/26/2011 22:02:20 ET T: 02/27/2011 07:52:37 ET JOB#: [id number]  cc: Bricia Taher K. Manuella Ghazi, MD, <Dictator> Royetta Crochet [facility] MD ELECTRONICALLY SIGNED 03/15/2011 9:11

## 2014-07-03 NOTE — Discharge Summary (Signed)
PATIENT NAME:  Tyrone Curtis, Tyrone Curtis MR#:  [id number] DATE OF BIRTH:  02-23-1951  DATE OF ADMISSION:  02/26/2011 DATE OF DISCHARGE:  02/28/2011  PRIMARY CARE PHYSICIAN: Dr. Reginia Forts    REASON FOR ADMISSION: Left-sided numbness and weakness.   DISCHARGE DIAGNOSES:  1. Large right posterior cerebral artery territory ischemic cerebrovascular accident with some residual left-sided weakness and numbness and visual impairment.  2. Hypercholesterolemia.  3. History of unspecified tumor removal from right upper extremity and intestines.  4. History of colon polyps. 5. History of internal hemorrhoids.   CONSULTANT: Neurology, Dr. Jennings Books.   LABORATORY, DIAGNOSTIC AND RADIOLOGICAL DATA:  Noncontrast head CT on admission: Findings are worrisome for evolving ischemic infarct in the posterior parietal and occipital lobes on the right, old lacunar infarction in the right caudate nucleus. No intracranial hemorrhage or evidence of midline shift.   Bilateral carotid artery Doppler ultrasound 02/26/2011: There was no hemodynamically significant stenosis on either side. There is antegrade flow noted in both vertebrals.   Brain MRI and MRA without contrast 02/26/2011: Large right PCA distribution infarct. There is subtle curvilinear areas of intrinsic T1 hyperintensity within region of infarct which are concerning for blood products, possibly subtle subarachnoid hemorrhage. Findings also concerning for thrombosis and occlusion of right posterior cerebral artery.   Repeat noncontrast head CT from 02/27/2011: Ongoing evolution of the ischemic infarction in the posterior right temporal as well as mid posterior parietal and occipital lobes on the right, minimal increased density along the posterior aspect of the area of infarct may reflect petechial hemorrhage.   2-D echocardiogram 02/27/2011: Essentially normal study with LV systolic function normal, ejection fraction greater than 55%. The transmitral  spectral Doppler flow pattern is suggestive of impaired LV relaxation. RV systolic function is normal. Left atrial size is normal. RV systolic pressure is normal.   EKG on admission with normal sinus rhythm, heart rate 72 beats per minute without acute ST or T wave changes.   CBC normal on admission.   Complete metabolic panel normal on admission.   Lipid panel: Total cholesterol 229, triglycerides 229, HDL 39, LDL 144.   BRIEF HISTORY/HOSPITAL COURSE: Patient is a 64 year old male with past medical history of unspecified tumor removal from right upper extremity and intestines, history of colon polyps and internal hemorrhoids who presented to the Emergency Department with complaints of left-sided weakness and numbness and tingling. Please see dictated admission history and physical for pertinent details surrounding the onset of this hospitalization. Please see below for further details.   Left-sided weakness, numbness and tingling due to acute ischemic cerebrovascular accident. Patient had had some peripheral visual loss in the recent past. On this occasion he presented with left-sided weakness and numbness and tingling mostly involving the arm and leg on the left. CT of the head was obtained in the ER which is a noncontrast study and was negative for intracranial hemorrhage or midline shift but findings were worrisome for evolving ischemic infarction of the posterior parietal and occipital lobes. Thereafter patient was started on aspirin and was admitted to the telemetry unit. He underwent further evaluation. Acute ischemic cerebrovascular accident was confirmed on MRI as patient was noted to have a large right PCA territory infarct involving the parietal, occipital as well as temporal lobes on the right. There is also a subtle area of T1 hyperintensity on the MRI concerning for possible blood products. Neurology consultation was obtained. The patient was seen and evaluated by Dr. Jennings Books and MRI  findings were felt  to reflect reperfusion injury with some blood products noted in the area involving patient's large ischemic cerebrovascular accident. Dr. Manuella Ghazi did not feel that patient required neurosurgical evaluation and felt that the blood was likely reperfusion injury. Dr. Manuella Ghazi recommended continuation of aspirin and repeating the head CT 24 hours later. Repeat noncontrast head CT was obtained thereafter revealing minimal punctate hyperdensity which could be blood, likely reperfusion injury per neurology and there is no need for further neurosurgeon at this time per Dr. Manuella Ghazi. Patient will continue aspirin and will need aggressive risk factor modification. He was noted to be hypercholesterolemic and since he had an acute stroke we advised him to start therapy with statin, however, patient is refusing medications for his hypercholesterolemia at this time and wishes to attempt a trial of diet and exercise control. Otherwise, work-up for cerebrovascular accident revealed thrombosis and occlusion of the right PCA on MRA. Carotid Doppler's were negative. There was no thrombotic source of cerebrovascular accident noted on his echocardiogram. Telemetry monitoring revealed persistent normal sinus rhythm without any arrhythmias. Patient was seen by physical therapy prior to discharge and recommendation was made for short-term rehabilitation but patient refuses to go to rehab at this time and preferred to go home with home health and PT and OT thereby upon his request these services have been arranged for him at home. I strongly advised the patient to consider rehab as advised by physical therapist and explained the risks of patient going home such as progression of his cerebrovascular accident and risk of death but he accepted these risks and wished to go home regardless. He was advised to avoid driving or operating motor vehicles or operating any heavy machinery for now given his visual impairment. Patient will follow  up with neurology as an outpatient for cerebrovascular accident. With aspirin therapy his weakness and numbness have slightly improved but have not returned to baseline and he still has some residual weakness and numbness on the left. On 02/28/2011 patient was hemodynamically stable with improvement of his weakness and numbness on the left as well as his tingling and was felt to be stable for discharge, however, he refused rehab therefore he was discharged home upon his request with outpatient home health, PT, OT and nursing to which patient was agreeable.   DISCHARGE DISPOSITION: Home with home health, PT and OT and nursing.   DISCHARGE ACTIVITY: As tolerated. Do operate any motor vehicles or heavy machinery.   DISCHARGE DIET: Low sodium, low fat, low cholesterol.   DISCHARGE MEDICATIONS:  1. Aspirin 325 mg daily.  2. Tylenol 325 mg 1 to 2 tablets p.o. every 4 to 6 hours p.r.n. pain.  3. Ambien 10 mg p.o. at bedtime p.r.n. insomnia.   DISCHARGE INSTRUCTIONS: Return to Emergency Department for recurrence of symptoms or for worsening of your numbness, tingling or weakness or for development of facial droop or slurred speech or for headache, worsening of your blurry vision or for hearing loss or for chest pain or shortness of breath. Return to the Emergency Department for fevers or chills.   FOLLOW UP INSTRUCTIONS: Follow up with Dr. Reginia Forts 1 to 2 weeks. Follow up with Dr. Jennings Books within 1 to 2 weeks.  TIME SPENT ON DISCHARGE: Greater than 30 minutes.   ____________________________ Romie Jumper, MD knl:cms D: 03/04/2011 22:57:10 ET T: 03/06/2011 11:55:01 ET JOB#: [id number]  cc: Romie Jumper, MD, <Dictator> Renette Butters. Tamala Julian, MD Hemang K. Manuella Ghazi, MD Romie Jumper MD ELECTRONICALLY SIGNED 03/14/2011 16:24

## 2014-08-01 ENCOUNTER — Encounter: Payer: Self-pay | Admitting: Family Medicine

## 2014-08-01 ENCOUNTER — Ambulatory Visit (INDEPENDENT_AMBULATORY_CARE_PROVIDER_SITE_OTHER): Payer: PPO | Admitting: Family Medicine

## 2014-08-01 VITALS — BP 129/79 | HR 60 | Temp 98.0°F | Resp 16 | Ht 70.5 in | Wt 212.8 lb

## 2014-08-01 DIAGNOSIS — J301 Allergic rhinitis due to pollen: Secondary | ICD-10-CM

## 2014-08-01 DIAGNOSIS — N5203 Combined arterial insufficiency and corporo-venous occlusive erectile dysfunction: Secondary | ICD-10-CM

## 2014-08-01 DIAGNOSIS — Z23 Encounter for immunization: Secondary | ICD-10-CM

## 2014-08-01 DIAGNOSIS — K219 Gastro-esophageal reflux disease without esophagitis: Secondary | ICD-10-CM

## 2014-08-01 DIAGNOSIS — R7302 Impaired glucose tolerance (oral): Secondary | ICD-10-CM

## 2014-08-01 DIAGNOSIS — G4700 Insomnia, unspecified: Secondary | ICD-10-CM

## 2014-08-01 DIAGNOSIS — Z125 Encounter for screening for malignant neoplasm of prostate: Secondary | ICD-10-CM | POA: Diagnosis not present

## 2014-08-01 DIAGNOSIS — Z8673 Personal history of transient ischemic attack (TIA), and cerebral infarction without residual deficits: Secondary | ICD-10-CM

## 2014-08-01 DIAGNOSIS — Z0000 Encounter for general adult medical examination without abnormal findings: Secondary | ICD-10-CM | POA: Diagnosis not present

## 2014-08-01 DIAGNOSIS — E7800 Pure hypercholesterolemia, unspecified: Secondary | ICD-10-CM

## 2014-08-01 LAB — CBC WITH DIFFERENTIAL/PLATELET
Basophils Absolute: 0 10*3/uL (ref 0.0–0.1)
Basophils Relative: 0 % (ref 0–1)
EOS PCT: 1 % (ref 0–5)
Eosinophils Absolute: 0.1 10*3/uL (ref 0.0–0.7)
HCT: 46.2 % (ref 39.0–52.0)
HEMOGLOBIN: 16.2 g/dL (ref 13.0–17.0)
LYMPHS ABS: 2 10*3/uL (ref 0.7–4.0)
LYMPHS PCT: 26 % (ref 12–46)
MCH: 30.9 pg (ref 26.0–34.0)
MCHC: 35.1 g/dL (ref 30.0–36.0)
MCV: 88.2 fL (ref 78.0–100.0)
MPV: 11 fL (ref 8.6–12.4)
Monocytes Absolute: 0.6 10*3/uL (ref 0.1–1.0)
Monocytes Relative: 8 % (ref 3–12)
NEUTROS ABS: 4.9 10*3/uL (ref 1.7–7.7)
Neutrophils Relative %: 65 % (ref 43–77)
Platelets: 248 10*3/uL (ref 150–400)
RBC: 5.24 MIL/uL (ref 4.22–5.81)
RDW: 13.3 % (ref 11.5–15.5)
WBC: 7.5 10*3/uL (ref 4.0–10.5)

## 2014-08-01 LAB — COMPREHENSIVE METABOLIC PANEL
ALK PHOS: 80 U/L (ref 39–117)
ALT: 22 U/L (ref 0–53)
AST: 21 U/L (ref 0–37)
Albumin: 4.2 g/dL (ref 3.5–5.2)
BILIRUBIN TOTAL: 0.6 mg/dL (ref 0.2–1.2)
BUN: 12 mg/dL (ref 6–23)
CHLORIDE: 105 meq/L (ref 96–112)
CO2: 30 mEq/L (ref 19–32)
CREATININE: 1.03 mg/dL (ref 0.50–1.35)
Calcium: 9.6 mg/dL (ref 8.4–10.5)
Glucose, Bld: 103 mg/dL — ABNORMAL HIGH (ref 70–99)
Potassium: 5.1 mEq/L (ref 3.5–5.3)
SODIUM: 142 meq/L (ref 135–145)
TOTAL PROTEIN: 7.4 g/dL (ref 6.0–8.3)

## 2014-08-01 LAB — LIPID PANEL
CHOLESTEROL: 153 mg/dL (ref 0–200)
HDL: 45 mg/dL (ref >=40)
LDL Cholesterol: 89 mg/dL (ref 0–99)
Total CHOL/HDL Ratio: 3.4 Ratio
Triglycerides: 97 mg/dL (ref <150)
VLDL: 19 mg/dL (ref 0–40)

## 2014-08-01 LAB — POCT URINALYSIS DIPSTICK
BILIRUBIN UA: NEGATIVE
Blood, UA: NEGATIVE
GLUCOSE UA: NEGATIVE
Ketones, UA: NEGATIVE
Leukocytes, UA: NEGATIVE
Nitrite, UA: NEGATIVE
PROTEIN UA: NEGATIVE
Spec Grav, UA: 1.01
UROBILINOGEN UA: 0.2
pH, UA: 7

## 2014-08-01 LAB — HEMOGLOBIN A1C
Hgb A1c MFr Bld: 6 % — ABNORMAL HIGH (ref <5.7)
Mean Plasma Glucose: 126 mg/dL — ABNORMAL HIGH (ref <117)

## 2014-08-01 MED ORDER — ZOSTER VACCINE LIVE 19400 UNT/0.65ML ~~LOC~~ SOLR: 0.6500 mL | Freq: Once | SUBCUTANEOUS | Status: DC

## 2014-08-01 MED ORDER — ALPRAZOLAM 0.5 MG PO TABS: 0.5000 mg | ORAL_TABLET | Freq: Every evening | ORAL | Status: DC | PRN

## 2014-08-01 NOTE — Patient Instructions (Signed)

## 2014-08-01 NOTE — Progress Notes (Signed)
Subjective:    Patient ID: Tyrone Curtis, male    DOB: 01/26/1951, 64 y.o.   MRN: [id number]  08/01/2014  Annual Exam; Tinnitus; and Eye Pain   HPI This 64 y.o. male presents for Welcome to Medicare Physical.  Last physical:  07-26-2013 Colonoscopy:  09-24-2012; not due until 10 years. TDAP:  2013 Pneumovax: never Zostavax: never; requesting new rx.   Influenza:  01/03/2014 Eye exam:  02/2014. No glaucoma Dental exam:  Due in June 2016; every six months.  Tinnitus:  Tested hearing during visit.  Suggested hearing aide to help with tinnitus.  Dr. 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  Psychologist friend has suggested accupuncture.    L facial pain:  S/p ophthalmology evaluation; s/p neurology evaluation; s/p ENT evaluation who felt L facial pain was phantom pain; Dr. Brigitte Pulse felt that facial pain was nerve inflammation.  Did not have a good experience with Dr. Tami Ribas.  Very brief visit.    L eye with crusting and tearing.  Nose is also tender.  Pain with chewing.   Focused more on hearing issues and tinnitus.  S/p dental evaluation as well.    Hyperlipidemia: Patient reports good compliance with medication, good tolerance to medication, and good symptom control.    Glucose Intolerance:  History of CVA:    Basal cell carcinoma: scheduled for resection by [facility].    Review of Systems  Constitutional: Negative for fever, chills, diaphoresis, activity change, appetite change, fatigue and unexpected weight change.  HENT: Negative for congestion, dental problem, drooling, ear discharge, ear pain, facial swelling, hearing loss, mouth sores, nosebleeds, postnasal drip, rhinorrhea, sinus pressure, sneezing, sore throat, tinnitus, trouble swallowing and voice change.   Eyes: Negative for photophobia, pain, discharge, redness, itching and visual disturbance.  Respiratory: Negative for apnea, cough, choking, chest tightness, shortness of breath, wheezing  and stridor.   Cardiovascular: Negative for chest pain, palpitations and leg swelling.  Gastrointestinal: Negative for nausea, vomiting, abdominal pain, diarrhea, constipation and blood in stool.  Endocrine: Negative for cold intolerance, heat intolerance, polydipsia, polyphagia and polyuria.  Genitourinary: Negative for dysuria, urgency, frequency, hematuria, flank pain, decreased urine volume, discharge, penile swelling, scrotal swelling, enuresis, difficulty urinating, genital sores, penile pain and testicular pain.  Musculoskeletal: Negative for myalgias, back pain, joint swelling, arthralgias, gait problem, neck pain and neck stiffness.  Skin: Negative for color change, pallor, rash and wound.  Allergic/Immunologic: Negative for environmental allergies, food allergies and immunocompromised state.  Neurological: Negative for dizziness, tremors, seizures, syncope, facial asymmetry, speech difficulty, weakness, light-headedness, numbness and headaches.  Hematological: Negative for adenopathy. Does not bruise/bleed easily.  Psychiatric/Behavioral: Negative for suicidal ideas, hallucinations, behavioral problems, confusion, sleep disturbance, self-injury, dysphoric mood, decreased concentration and agitation. The patient is not nervous/anxious and is not hyperactive.     Past Medical History  Diagnosis Date  . CVA (cerebral vascular accident)   . Arthritis   . 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 Erectile dysfunction   . Anxiety state, unspecified   . Regurgitation   . Dysphagia, unspecified(787.20)   . Herpes zoster without mention of complication   . Pure hypercholesterolemia   . Allergic rhinitis, cause unspecified   . Basal cell carcinoma     facial  . Personal history of colonic polyps   . Internal hemorrhoids without mention of complication   . Measles   . Chicken pox   . Mumps     x 2  .  Depression   . Cataract   . Allergy    Past Surgical  History  Procedure Laterality Date  . Small bowel mass  1991    removed part of small intestine benign mass  . Basal cell carcinoma of anterior chest  2000    resection; [facility] Iles  . Cataract surgery  2010    with lens repacement Left  . Small intestine surgery    . Colonoscopy  09/23/12    normal.  Symptoms: anemia, hemoccult+.  [facility].  . Esophagogastroduodenoscopy  09/23/12    normal.  Symptoms: anemia, hemoccult +, GERD.   Allergies  Allergen Reactions  . Zyrtec D [Cetirizine-Pseudoephedrine Er]     "talking out of his head"   Current Outpatient Prescriptions  Medication Sig Dispense Refill  . ALPRAZolam (XANAX) 0.5 MG tablet Take 1 tablet (0.5 mg total) by mouth at bedtime as needed. 30 tablet 3  . aspirin 325 MG EC tablet Take 81 mg by mouth daily.     Marland Kitchen atorvastatin (LIPITOR) 40 MG tablet Take 1 tablet (40 mg total) by mouth daily. 30 tablet 11  . fluticasone (FLONASE) 50 MCG/ACT nasal spray Place 2 sprays into both nostrils daily. 16 g 10  . loratadine (CLARITIN) 10 MG tablet Take 10 mg by mouth daily.    . Multiple Vitamin (MULTIVITAMINS PO) Take by mouth daily.    Marland Kitchen omeprazole (PRILOSEC) 40 MG capsule Take 1 capsule (40 mg total) by mouth daily. 30 capsule 11  . clopidogrel (PLAVIX) 75 MG tablet Take 1 tablet (75 mg total) by mouth daily. (Patient not taking: Reported on 03/09/2014) 30 tablet 11  . hydrocortisone (ANUSOL-HC) 25 MG suppository Place 1 suppository (25 mg total) rectally 2 (two) times daily. (Patient not taking: Reported on 08/01/2014) 12 suppository 2  . hydrOXYzine (ATARAX/VISTARIL) 25 MG tablet Take 0.5-1 tablets (12.5-25 mg total) by mouth at bedtime as needed. (Patient not taking: Reported on 08/01/2014) 30 tablet 0  . zoster vaccine live, PF, (ZOSTAVAX) 56433 UNT/0.65ML injection Inject 19,400 Units into the skin once. (Patient not taking: Reported on 08/01/2014) 0.65 mL 0   No current facility-administered medications for this visit.         Objective:    BP 129/79 mmHg  Pulse 60  Temp(Src) 98 F (36.7 C) (Oral)  Resp 16  Ht 5' 10.5" (1.791 m)  Wt 212 lb 12.8 oz (96.525 kg)  BMI 30.09 kg/m2  SpO2 97% Physical Exam  Constitutional: He is oriented to person, place, and time. He appears well-developed and well-nourished. No distress.  HENT:  Head: Normocephalic and atraumatic.  Right Ear: External ear normal.  Left Ear: External ear normal.  Nose: Nose normal.  Mouth/Throat: Oropharynx is clear and moist.  Eyes: Conjunctivae and EOM are normal. Pupils are equal, round, and reactive to light.  Neck: Normal range of motion. Neck supple. Carotid bruit is not present. No thyromegaly present.  Cardiovascular: Normal rate, regular rhythm, normal heart sounds and intact distal pulses.  Exam reveals no gallop and no friction rub.   No murmur heard. Pulmonary/Chest: Effort normal and breath sounds normal. He has no wheezes. He has no rales.  Abdominal: Soft. Bowel sounds are normal. He exhibits no distension and no mass. There is no tenderness. There is no rebound and no guarding.  Musculoskeletal:       Right shoulder: Normal.       Left shoulder: Normal.       Cervical back: Normal.  Lymphadenopathy:  He has no cervical adenopathy.  Neurological: He is alert and oriented to person, place, and time. He has normal reflexes. No cranial nerve deficit. He exhibits normal muscle tone. Coordination normal.  Skin: Skin is warm and dry. No rash noted. He is not diaphoretic.  Psychiatric: He has a normal mood and affect. His behavior is normal. Judgment and thought content normal.   Results for orders placed or performed in visit on 01/03/14  CBC with Differential  Result Value Ref Range   WBC 7.5 4.0 - 10.5 K/uL   RBC 5.20 4.22 - 5.81 MIL/uL   Hemoglobin 15.9 13.0 - 17.0 g/dL   HCT 46.3 39.0 - 52.0 %   MCV 89.0 78.0 - 100.0 fL   MCH 30.6 26.0 - 34.0 pg   MCHC 34.3 30.0 - 36.0 g/dL   RDW 13.0 11.5 - 15.5 %   Platelets 250  150 - 400 K/uL   Neutrophils Relative % 64 43 - 77 %   Neutro Abs 4.8 1.7 - 7.7 K/uL   Lymphocytes Relative 27 12 - 46 %   Lymphs Abs 2.0 0.7 - 4.0 K/uL   Monocytes Relative 8 3 - 12 %   Monocytes Absolute 0.6 0.1 - 1.0 K/uL   Eosinophils Relative 1 0 - 5 %   Eosinophils Absolute 0.1 0.0 - 0.7 K/uL   Basophils Relative 0 0 - 1 %   Basophils Absolute 0.0 0.0 - 0.1 K/uL   Smear Review Criteria for review not met   COMPLETE METABOLIC PANEL WITH GFR  Result Value Ref Range   Sodium 139 135 - 145 mEq/L   Potassium 4.7 3.5 - 5.3 mEq/L   Chloride 105 96 - 112 mEq/L   CO2 27 19 - 32 mEq/L   Glucose, Bld 87 70 - 99 mg/dL   BUN 7 6 - 23 mg/dL   Creat 0.97 0.50 - 1.35 mg/dL   Total Bilirubin 1.1 0.2 - 1.2 mg/dL   Alkaline Phosphatase 75 39 - 117 U/L   AST 22 0 - 37 U/L   ALT 18 0 - 53 U/L   Total Protein 7.4 6.0 - 8.3 g/dL   Albumin 4.4 3.5 - 5.2 g/dL   Calcium 9.6 8.4 - 10.5 mg/dL   GFR, Est African American >89 mL/min   GFR, Est Non African American 82 mL/min  Lipid panel  Result Value Ref Range   Cholesterol 151 0 - 200 mg/dL   Triglycerides 102 <150 mg/dL   HDL 44 >39 mg/dL   Total CHOL/HDL Ratio 3.4 Ratio   VLDL 20 0 - 40 mg/dL   LDL Cholesterol 87 0 - 99 mg/dL  Hemoglobin A1c  Result Value Ref Range   Hgb A1c MFr Bld 5.6 <5.7 %   Mean Plasma Glucose 114 <117 mg/dL       Assessment & Plan:   1. Welcome to Medicare preventive visit   2. Pure hypercholesterolemia   3. Insomnia   4. Glucose intolerance (impaired glucose tolerance)   5. Gastroesophageal reflux disease without esophagitis   6. Elevated cholesterol   7. Allergic rhinitis due to pollen   8. Screening for prostate cancer     No orders of the defined types were placed in this encounter.    No Follow-up on file.     Abbie Jablon Elayne Guerin, M.D. [facility] [address] [place], [place]  [address] [phone] phone [phone] fax

## 2014-08-01 NOTE — Progress Notes (Signed)
   Subjective:    Patient ID: Tyrone Curtis, male    DOB: [birth year], 64 y.o.   MRN: [id number]  HPI    Review of Systems  Constitutional: Positive for fatigue.  HENT: Positive for ear pain.   Eyes: Negative.   Respiratory: Negative.   Cardiovascular: Negative.   Gastrointestinal: Negative.   Endocrine: Negative.   Genitourinary: Negative.   Musculoskeletal: Negative.   Skin: Negative.   Allergic/Immunologic: Negative.   Neurological: Negative.   Hematological: Negative.   Psychiatric/Behavioral: Negative.        Objective:   Physical Exam        Assessment & Plan:

## 2014-08-02 ENCOUNTER — Other Ambulatory Visit: Payer: Self-pay | Admitting: Family Medicine

## 2014-08-02 LAB — PSA, MEDICARE: PSA: 1.03 ng/mL (ref <=4.00)

## 2015-01-04 ENCOUNTER — Telehealth: Payer: Self-pay

## 2015-01-04 ENCOUNTER — Other Ambulatory Visit: Payer: Self-pay | Admitting: Family Medicine

## 2015-01-04 DIAGNOSIS — K219 Gastro-esophageal reflux disease without esophagitis: Secondary | ICD-10-CM

## 2015-01-04 MED ORDER — OMEPRAZOLE 40 MG PO CPDR: 40.0000 mg | DELAYED_RELEASE_CAPSULE | Freq: Every day | ORAL | Status: DC

## 2015-01-04 NOTE — Telephone Encounter (Signed)
Pt would like a refill on his omeprazole (PRILOSEC) 40 MG capsule [[id number]]. Please advise at [phone]

## 2015-01-04 NOTE — Telephone Encounter (Signed)
Rx sent 

## 2015-01-09 ENCOUNTER — Ambulatory Visit (INDEPENDENT_AMBULATORY_CARE_PROVIDER_SITE_OTHER): Payer: PPO | Admitting: Family Medicine

## 2015-01-09 ENCOUNTER — Encounter: Payer: Self-pay | Admitting: Family Medicine

## 2015-01-09 VITALS — BP 119/79 | HR 56 | Temp 97.9°F | Resp 16 | Ht 70.25 in | Wt 218.2 lb

## 2015-01-09 DIAGNOSIS — R51 Headache: Secondary | ICD-10-CM

## 2015-01-09 DIAGNOSIS — G4700 Insomnia, unspecified: Secondary | ICD-10-CM

## 2015-01-09 DIAGNOSIS — Z1159 Encounter for screening for other viral diseases: Secondary | ICD-10-CM

## 2015-01-09 DIAGNOSIS — N5203 Combined arterial insufficiency and corporo-venous occlusive erectile dysfunction: Secondary | ICD-10-CM | POA: Diagnosis not present

## 2015-01-09 DIAGNOSIS — F329 Major depressive disorder, single episode, unspecified: Secondary | ICD-10-CM

## 2015-01-09 DIAGNOSIS — Z8673 Personal history of transient ischemic attack (TIA), and cerebral infarction without residual deficits: Secondary | ICD-10-CM | POA: Diagnosis not present

## 2015-01-09 DIAGNOSIS — H9313 Tinnitus, bilateral: Secondary | ICD-10-CM

## 2015-01-09 DIAGNOSIS — K219 Gastro-esophageal reflux disease without esophagitis: Secondary | ICD-10-CM

## 2015-01-09 DIAGNOSIS — E7800 Pure hypercholesterolemia, unspecified: Secondary | ICD-10-CM

## 2015-01-09 DIAGNOSIS — Z23 Encounter for immunization: Secondary | ICD-10-CM

## 2015-01-09 DIAGNOSIS — F32A Depression, unspecified: Secondary | ICD-10-CM

## 2015-01-09 DIAGNOSIS — J301 Allergic rhinitis due to pollen: Secondary | ICD-10-CM | POA: Diagnosis not present

## 2015-01-09 DIAGNOSIS — R7309 Other abnormal glucose: Secondary | ICD-10-CM | POA: Diagnosis not present

## 2015-01-09 DIAGNOSIS — R519 Headache, unspecified: Secondary | ICD-10-CM

## 2015-01-09 LAB — CBC WITH DIFFERENTIAL/PLATELET
BASOS ABS: 0 10*3/uL (ref 0.0–0.1)
Basophils Relative: 0 % (ref 0–1)
EOS ABS: 0.1 10*3/uL (ref 0.0–0.7)
EOS PCT: 1 % (ref 0–5)
HCT: 47.8 % (ref 39.0–52.0)
Hemoglobin: 16.3 g/dL (ref 13.0–17.0)
LYMPHS ABS: 2.1 10*3/uL (ref 0.7–4.0)
Lymphocytes Relative: 30 % (ref 12–46)
MCH: 30.3 pg (ref 26.0–34.0)
MCHC: 34.1 g/dL (ref 30.0–36.0)
MCV: 88.8 fL (ref 78.0–100.0)
MPV: 11.7 fL (ref 8.6–12.4)
Monocytes Absolute: 0.6 10*3/uL (ref 0.1–1.0)
Monocytes Relative: 8 % (ref 3–12)
Neutro Abs: 4.3 10*3/uL (ref 1.7–7.7)
Neutrophils Relative %: 61 % (ref 43–77)
PLATELETS: 251 10*3/uL (ref 150–400)
RBC: 5.38 MIL/uL (ref 4.22–5.81)
RDW: 12.9 % (ref 11.5–15.5)
WBC: 7.1 10*3/uL (ref 4.0–10.5)

## 2015-01-09 LAB — COMPREHENSIVE METABOLIC PANEL
ALT: 22 U/L (ref 9–46)
AST: 23 U/L (ref 10–35)
Albumin: 4.2 g/dL (ref 3.6–5.1)
Alkaline Phosphatase: 87 U/L (ref 40–115)
BILIRUBIN TOTAL: 1 mg/dL (ref 0.2–1.2)
BUN: 8 mg/dL (ref 7–25)
CALCIUM: 9.5 mg/dL (ref 8.6–10.3)
CO2: 28 mmol/L (ref 20–31)
CREATININE: 0.99 mg/dL (ref 0.70–1.25)
Chloride: 103 mmol/L (ref 98–110)
Glucose, Bld: 98 mg/dL (ref 65–99)
Potassium: 5 mmol/L (ref 3.5–5.3)
Sodium: 139 mmol/L (ref 135–146)
Total Protein: 7 g/dL (ref 6.1–8.1)

## 2015-01-09 LAB — HEMOGLOBIN A1C
Hgb A1c MFr Bld: 6.1 % — ABNORMAL HIGH (ref <5.7)
Mean Plasma Glucose: 128 mg/dL — ABNORMAL HIGH (ref <117)

## 2015-01-09 LAB — LIPID PANEL
CHOL/HDL RATIO: 4.4 ratio (ref <=5.0)
CHOLESTEROL: 157 mg/dL (ref 125–200)
HDL: 36 mg/dL — AB (ref >=40)
LDL Cholesterol: 85 mg/dL (ref <130)
Triglycerides: 181 mg/dL — ABNORMAL HIGH (ref <150)
VLDL: 36 mg/dL — AB (ref <30)

## 2015-01-09 LAB — HEPATITIS C ANTIBODY: HCV Ab: NEGATIVE

## 2015-01-09 NOTE — Progress Notes (Signed)
Subjective:    Patient ID: Tyrone Curtis, male    DOB: 1951/02/23, 64 y.o.   MRN: [id number]  01/09/2015  Follow-up   HPI This 64 y.o. male presents for six month follow-up:   1. Hyperlipidemia: Patient reports good compliance with medication, good tolerance to medication, and good symptom control.    2.  Glucose Intolerance: due for labs.  Has gained 14 pounds in the past year.  Exercises regularly.  3.  History of CVA: Patient reports good compliance with medication, good tolerance to medication, and good symptom control.  Followed by neurology/[facility].    4.  GERD: Patient reports good compliance with medication, good tolerance to medication, and good symptom control.    5.  L facial pain:  Very minimal pain until now; now sinus congestion.  +L facial pain recurring.  S/p neurology consultation; s/p ENT consultation.   6.  Decreased memory: repeating self.  Down about this a bit.  Family history of dementia.  Mother, sister, and brother diagnosed with fatty liver disease.  Comfort eating.  Brother with severe fatty liver disease.  Sister losing weight. Weight up 14 pounds.   Depressed due to concentration issues.  Sister gained 30 pounds due to depression.  Difficulties concentrating; has lost enthusiasm for writing in past four months.  Sister is editing work.  Writing stories about childhood.  Sister has noticed not sending anything.   Very concerned about dementia development; wants to be proactive.  Spending most of time at the beach now.  Spending winter at the beach this year.    Review of Systems  Constitutional: Negative for fever, chills, diaphoresis, activity change, appetite change and fatigue.  Eyes: Negative for visual disturbance.  Respiratory: Negative for cough and shortness of breath.   Cardiovascular: Negative for chest pain, palpitations and leg swelling.  Endocrine: Negative for cold intolerance, heat intolerance, polydipsia, polyphagia and polyuria.    Neurological: Negative for dizziness, tremors, seizures, syncope, facial asymmetry, speech difficulty, weakness, light-headedness, numbness and headaches.    Past Medical History  Diagnosis Date  . CVA (cerebral vascular accident) ([place])   . Arthritis   . 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 Erectile dysfunction   . Anxiety state, unspecified   . Regurgitation   . Dysphagia, unspecified(787.20)   . Herpes zoster without mention of complication   . Pure hypercholesterolemia   . Allergic rhinitis, cause unspecified   . Basal cell carcinoma     facial  . Personal history of colonic polyps   . Internal hemorrhoids without mention of complication   . Measles   . Chicken pox   . Mumps     x 2  . Depression   . Cataract   . Allergy    Past Surgical History  Procedure Laterality Date  . Small bowel mass  1991    removed part of small intestine benign mass  . Basal cell carcinoma of anterior chest  2000    resection; [facility]lett Iles  . Cataract surgery  2010    with lens repacement Left  . Small intestine surgery    . Colonoscopy  09/23/12    normal.  Symptoms: anemia, hemoccult+.  [facility].  . Esophagogastroduodenoscopy  09/23/12    normal.  Symptoms: anemia, hemoccult +, GERD.   Allergies  Allergen Reactions  . Zyrtec D [Cetirizine-Pseudoephedrine Er]     "talking out of his head"   Current Outpatient Prescriptions  Medication Sig Dispense Refill  .  ALPRAZolam (XANAX) 0.5 MG tablet Take 1 tablet (0.5 mg total) by mouth at bedtime as needed. 30 tablet 5  . aspirin 325 MG EC tablet Take 81 mg by mouth daily.     Marland Kitchen atorvastatin (LIPITOR) 40 MG tablet TAKE 1 TABLET BY MOUTH EVERY DAY 30 tablet 5  . fluticasone (FLONASE) 50 MCG/ACT nasal spray Place 2 sprays into both nostrils daily. 16 g 10  . loratadine (CLARITIN) 10 MG tablet Take 10 mg by mouth daily.    . Multiple Vitamin (MULTIVITAMINS PO) Take by mouth daily.    Marland Kitchen omeprazole (PRILOSEC)  40 MG capsule Take 1 capsule (40 mg total) by mouth daily. 30 capsule 6  . zoster vaccine live, PF, (ZOSTAVAX) 16109 UNT/0.65ML injection Inject 19,400 Units into the skin once. 1 each 0  . clopidogrel (PLAVIX) 75 MG tablet Take 1 tablet (75 mg total) by mouth daily. (Patient not taking: Reported on 03/09/2014) 30 tablet 11  . hydrocortisone (ANUSOL-HC) 25 MG suppository Place 1 suppository (25 mg total) rectally 2 (two) times daily. (Patient not taking: Reported on 08/01/2014) 12 suppository 2  . hydrOXYzine (ATARAX/VISTARIL) 25 MG tablet Take 0.5-1 tablets (12.5-25 mg total) by mouth at bedtime as needed. (Patient not taking: Reported on 08/01/2014) 30 tablet 0  . zoster vaccine live, PF, (ZOSTAVAX) 60454 UNT/0.65ML injection Inject 19,400 Units into the skin once. (Patient not taking: Reported on 01/09/2015) 0.65 mL 0   No current facility-administered medications for this visit.   Social History   Social History  . Marital Status: Married    Spouse Name: N/A  . Number of Children: 1  . Years of Education: N/A   Occupational History  . Disability     CVA  . Previous owner of [employer]    Social History Main Topics  . Smoking status: Former Smoker -- 2.00 packs/day for 25 years    Types: Cigarettes  . Smokeless tobacco: Not on file     Comment: quit in 1984  . Alcohol Use: No     Comment: 3-5 drinks  . Drug Use: No  . Sexual Activity: Not on file   Other Topics Concern  . Not on file   Social History Narrative   Always uses seat belts. Smoke alarm and carbon monoxide detector in home.No Guns. Caffeine: Coffee 5 x daily.Married x 37 years Happily. Lives with spouse bought a beach house at [place] in 2011. Also has mountain house in [place]. 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 Exercise: 3 x week walking; 2 miles daily with wife, during winter months in gym-gazelle and stair-master. Pt doing PT 30 - 45 mins per day.   Family History  Problem  Relation Age of Onset  . Hyperlipidemia Sister   . Mental illness Sister   . Mental retardation Sister     anxiety  . Hyperlipidemia Brother   . Diabetes Brother   . Arthritis Brother     Knee replacement  . Bipolar disorder Brother   . Diabetes Mother   . Depression Mother   . Esophageal varices Mother   . Hyperlipidemia Mother   . Mental illness Mother   . Cancer Father     unknown       Objective:    BP 119/79 mmHg  Pulse 56  Temp(Src) 97.9 F (36.6 C) (Oral)  Resp 16  Ht 5' 10.25" (1.784 m)  Wt 218 lb 3.2 oz (98.975 kg)  BMI 31.10 kg/m2 Physical Exam  Constitutional: He is oriented to person, place, and time. He appears well-developed and well-nourished. No distress.  HENT:  Head: Normocephalic and atraumatic.  Right Ear: External ear normal.  Left Ear: External ear normal.  Nose: Nose normal.  Mouth/Throat: Oropharynx is clear and moist.  Eyes: Conjunctivae and EOM are normal. Pupils are equal, round, and reactive to light.  Neck: Normal range of motion. Neck supple. Carotid bruit is not present. No thyromegaly present.  Cardiovascular: Normal rate, regular rhythm, normal heart sounds and intact distal pulses.  Exam reveals no gallop and no friction rub.   No murmur heard. Pulmonary/Chest: Effort normal and breath sounds normal. He has no wheezes. He has no rales.  Abdominal: Soft. Bowel sounds are normal. He exhibits no distension and no mass. There is no tenderness. There is no rebound and no guarding.  Lymphadenopathy:    He has no cervical adenopathy.  Neurological: He is alert and oriented to person, place, and time. No cranial nerve deficit.  Skin: Skin is warm and dry. No rash noted. He is not diaphoretic.  Psychiatric: He has a normal mood and affect. His behavior is normal.  Nursing note and vitals reviewed.  Results for orders placed or performed in visit on 08/01/14  CBC with Differential/Platelet  Result Value Ref Range   WBC 7.5 4.0 - 10.5 K/uL     RBC 5.24 4.22 - 5.81 MIL/uL   Hemoglobin 16.2 13.0 - 17.0 g/dL   HCT 46.2 39.0 - 52.0 %   MCV 88.2 78.0 - 100.0 fL   MCH 30.9 26.0 - 34.0 pg   MCHC 35.1 30.0 - 36.0 g/dL   RDW 13.3 11.5 - 15.5 %   Platelets 248 150 - 400 K/uL   MPV 11.0 8.6 - 12.4 fL   Neutrophils Relative % 65 43 - 77 %   Neutro Abs 4.9 1.7 - 7.7 K/uL   Lymphocytes Relative 26 12 - 46 %   Lymphs Abs 2.0 0.7 - 4.0 K/uL   Monocytes Relative 8 3 - 12 %   Monocytes Absolute 0.6 0.1 - 1.0 K/uL   Eosinophils Relative 1 0 - 5 %   Eosinophils Absolute 0.1 0.0 - 0.7 K/uL   Basophils Relative 0 0 - 1 %   Basophils Absolute 0.0 0.0 - 0.1 K/uL   Smear Review Criteria for review not met   Comprehensive metabolic panel  Result Value Ref Range   Sodium 142 135 - 145 mEq/L   Potassium 5.1 3.5 - 5.3 mEq/L   Chloride 105 96 - 112 mEq/L   CO2 30 19 - 32 mEq/L   Glucose, Bld 103 (H) 70 - 99 mg/dL   BUN 12 6 - 23 mg/dL   Creat 1.03 0.50 - 1.35 mg/dL   Total Bilirubin 0.6 0.2 - 1.2 mg/dL   Alkaline Phosphatase 80 39 - 117 U/L   AST 21 0 - 37 U/L   ALT 22 0 - 53 U/L   Total Protein 7.4 6.0 - 8.3 g/dL   Albumin 4.2 3.5 - 5.2 g/dL   Calcium 9.6 8.4 - 10.5 mg/dL  Hemoglobin A1c  Result Value Ref Range   Hgb A1c MFr Bld 6.0 (H) <5.7 %   Mean Plasma Glucose 126 (H) <117 mg/dL  Lipid panel  Result Value Ref Range   Cholesterol 153 0 - 200 mg/dL   Triglycerides 97 <150 mg/dL   HDL 45 >=40 mg/dL   Total CHOL/HDL Ratio 3.4 Ratio   VLDL 19 0 -  40 mg/dL   LDL Cholesterol 89 0 - 99 mg/dL  PSA, Medicare  Result Value Ref Range   PSA 1.03 <=4.00 ng/mL  POCT urinalysis dipstick  Result Value Ref Range   Color, UA yellow    Clarity, UA clear    Glucose, UA neg    Bilirubin, UA neg    Ketones, UA neg    Spec Grav, UA 1.010    Blood, UA neg    pH, UA 7.0    Protein, UA neg    Urobilinogen, UA 0.2    Nitrite, UA neg    Leukocytes, UA Negative    INFLUENZA VACCINE ADMINISTERED.    Assessment & Plan:   1. Pure  hypercholesterolemia   2. Other abnormal glucose   3. Insomnia   4. History of CVA (cerebrovascular accident)   5. Gastroesophageal reflux disease without esophagitis   6. Combined arterial insufficiency and corporo-venous occlusive erectile dysfunction   7. Allergic rhinitis due to pollen   8. Need for hepatitis C screening test   9. Need for prophylactic vaccination and inoculation against influenza   10. Depression   11. Tinnitus, bilateral   12. Left facial pain     Orders Placed This Encounter  Procedures  . Flu Vaccine QUAD 36+ mos IM  . CBC with Differential/Platelet  . Comprehensive metabolic panel    Order Specific Question:  Has the patient fasted?    Answer:  Yes  . Hemoglobin A1c  . Lipid panel    Order Specific Question:  Has the patient fasted?    Answer:  Yes  . Hepatitis C antibody   No orders of the defined types were placed in this encounter.    Return in about 6 months (around 07/09/2015) for complete physical examiniation.    Tyrone Curtis Elayne Guerin, M.D. [facility] [address] [place], [place]  [address] [phone] phone [phone] fax

## 2015-01-11 MED ORDER — SERTRALINE HCL 25 MG PO TABS: ORAL_TABLET | ORAL | Status: DC

## 2015-02-07 ENCOUNTER — Other Ambulatory Visit: Payer: Self-pay | Admitting: Family Medicine

## 2015-02-08 ENCOUNTER — Other Ambulatory Visit: Payer: Self-pay | Admitting: Family Medicine

## 2015-02-08 NOTE — Telephone Encounter (Signed)
Please call in Xanax as approved. 

## 2015-02-13 ENCOUNTER — Ambulatory Visit (INDEPENDENT_AMBULATORY_CARE_PROVIDER_SITE_OTHER): Payer: PPO | Admitting: Family Medicine

## 2015-02-13 ENCOUNTER — Encounter: Payer: Self-pay | Admitting: Family Medicine

## 2015-02-13 VITALS — BP 129/78 | HR 69 | Temp 98.3°F | Resp 16 | Ht 70.5 in | Wt 212.0 lb

## 2015-02-13 DIAGNOSIS — F418 Other specified anxiety disorders: Secondary | ICD-10-CM

## 2015-02-13 DIAGNOSIS — R7302 Impaired glucose tolerance (oral): Secondary | ICD-10-CM | POA: Diagnosis not present

## 2015-02-13 DIAGNOSIS — F329 Major depressive disorder, single episode, unspecified: Secondary | ICD-10-CM

## 2015-02-13 DIAGNOSIS — F419 Anxiety disorder, unspecified: Principal | ICD-10-CM

## 2015-02-13 MED ORDER — ALPRAZOLAM 0.5 MG PO TABS: 0.5000 mg | ORAL_TABLET | Freq: Every evening | ORAL | Status: DC | PRN

## 2015-02-13 NOTE — Progress Notes (Signed)
Subjective:    Patient ID: Tyrone Curtis, male    DOB: 12/14/1950, 64 y.o.   MRN: [id number]  02/13/2015  Follow-up and Medication Refill   HPI This 64 y.o. male presents for six week follow-up of depression. Started patient on Zoloft 58m daily for three weeks and then increased to two tablets daily. Tolerating medication well. Moving in the right direction.  Still getting aggravated at times.  Also taking Xanax at bedtime for insomnia.  Sister takes same medication and gained so much from her experience; she has been taking something for 30 years.  Sister takes 526mof Zoloft.  Taking Zoloft 2564mwo daily.  Can take up to six weeks to take effect.  Just started taking two tablets this week.  Demeanor is less anxious.  [place] [employer]  Everyone was there this weekend.  No other side effects to Sertraline.  Interests are coming back that were lost.  May be constipating.  No SI/HI.  Feels sluggish on medication.   Fatty liver family hx:  Sister and brother and mother with fatty liver.  Weight down six pounds.     Review of Systems  Constitutional: Negative for fever, chills, diaphoresis, activity change, appetite change and fatigue.  Respiratory: Negative for cough and shortness of breath.   Cardiovascular: Negative for chest pain, palpitations and leg swelling.  Gastrointestinal: Negative for nausea, vomiting, abdominal pain and diarrhea.  Endocrine: Negative for cold intolerance, heat intolerance, polydipsia, polyphagia and polyuria.  Skin: Negative for color change, rash and wound.  Neurological: Negative for dizziness, tremors, seizures, syncope, facial asymmetry, speech difficulty, weakness, light-headedness, numbness and headaches.  Psychiatric/Behavioral: Negative for sleep disturbance and dysphoric mood. The patient is not nervous/anxious.     Past Medical History  Diagnosis Date  . [place]  Arthritis   . 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 Erectile dysfunction   . Anxiety state, unspecified   . Regurgitation   . Dysphagia, unspecified(787.20)   . Herpes zoster without mention of complication   . Pure hypercholesterolemia   . Allergic rhinitis, cause unspecified   . Basal cell carcinoma     facial  . Personal history of colonic polyps   . Internal hemorrhoids without mention of complication   . Measles   . Chicken pox   . Mumps     x 2  . Depression   . Cataract   . Allergy    Past Surgical History  Procedure Laterality Date  . Small bowel mass  1991    removed part of small intestine benign mass  . Basal cell carcinoma of anterior chest  2000    resection; [facility] Cataract surgery  2010    with lens repacement Left  . Small intestine surgery    . Colonoscopy  09/23/12    normal.  Symptoms: anemia, hemoccult+.  [facility].  . Esophagogastroduodenoscopy  09/23/12    normal.  Symptoms: anemia, hemoccult +, GERD.   Allergies  Allergen Reactions  . Zyrtec D [Cetirizine-Pseudoephedrine Er]     "talking out of his head"   Current Outpatient Prescriptions  Medication Sig Dispense Refill  . ALPRAZolam (XANAX) 0.5 MG tablet Take 1 tablet (0.5 mg total) by mouth at bedtime as needed. 30 tablet 5  . aspirin 325 MG EC tablet Take 81 mg by mouth daily.     . clopidogrel (PLAVIX) 75  MG tablet Take 1 tablet (75 mg total) by mouth daily. 30 tablet 11  . fluticasone (FLONASE) 50 MCG/ACT nasal spray Place 2 sprays into both nostrils daily. 16 g 10  . hydrOXYzine (ATARAX/VISTARIL) 25 MG tablet Take 0.5-1 tablets (12.5-25 mg total) by mouth at bedtime as needed. 30 tablet 0  . loratadine (CLARITIN) 10 MG tablet Take 10 mg by mouth daily.    . Multiple Vitamin (MULTIVITAMINS PO) Take by mouth daily.    Marland Kitchen omeprazole (PRILOSEC) 40 MG capsule Take 1 capsule (40 mg total) by mouth daily. 30 capsule 6  . sertraline (ZOLOFT) 25 MG tablet  One tablet daily for three weeks; then increase to two tablets daily. 60 tablet 1  . atorvastatin (LIPITOR) 40 MG tablet Take 1 tablet by mouth every day 30 tablet 3  . hydrocortisone (ANUSOL-HC) 25 MG suppository Place 1 suppository (25 mg total) rectally 2 (two) times daily. (Patient not taking: Reported on 02/13/2015) 12 suppository 2  . zoster vaccine live, PF, (ZOSTAVAX) 92426 UNT/0.65ML injection Inject 19,400 Units into the skin once. 1 each 0  . zoster vaccine live, PF, (ZOSTAVAX) 83419 UNT/0.65ML injection Inject 19,400 Units into the skin once. (Patient not taking: Reported on 01/09/2015) 0.65 mL 0   No current facility-administered medications for this visit.   Social History   Social History  . Marital Status: Married    Spouse Name: N/A  . Number of Children: 1  . Years of Education: N/A   Occupational History  . Disability     CVA  . Previous owner of [employer]    Social History Main Topics  . Smoking status: Former Smoker -- 2.00 packs/day for 25 years    Types: Cigarettes  . Smokeless tobacco: Not on file     Comment: quit in 1984  . Alcohol Use: No     Comment: 3-5 drinks  . Drug Use: No  . Sexual Activity: Not on file   Other Topics Concern  . Not on file   Social History Narrative   Always uses seat belts. Smoke alarm and carbon monoxide detector in home.No Guns. Caffeine: Coffee 5 x daily.Married x 37 years Happily. Lives with spouse bought a beach house at [place] [employer] in 2011. Also has mountain house in [place]. 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 Exercise: 3 x week walking; 2 miles daily with wife, during winter months in gym-gazelle and stair-master. Pt doing PT 30 - 45 mins per day.   Family History  Problem Relation Age of Onset  . Hyperlipidemia Sister   . Mental illness Sister   . Mental retardation Sister     anxiety  . Hyperlipidemia Brother   . Diabetes Brother   . Arthritis Brother     Knee replacement  .  Bipolar disorder Brother   . Diabetes Mother   . Depression Mother   . Esophageal varices Mother   . Hyperlipidemia Mother   . Mental illness Mother   . Cancer Father     unknown       Objective:    BP 129/78 mmHg  Pulse 69  Temp(Src) 98.3 F (36.8 C) (Oral)  Resp 16  Ht 5' 10.5" (1.791 m)  Wt 212 lb (96.163 kg)  BMI 29.98 kg/m2 Physical Exam  Constitutional: He is oriented to person, place, and time. He appears well-developed and well-nourished. No distress.  HENT:  Head: Normocephalic and atraumatic.  Right Ear: External ear normal.  Left Ear:  External ear normal.  Nose: Nose normal.  Mouth/Throat: Oropharynx is clear and moist.  Eyes: Conjunctivae and EOM are normal. Pupils are equal, round, and reactive to light.  Neck: Normal range of motion. Neck supple. Carotid bruit is not present. No thyromegaly present.  Cardiovascular: Normal rate, regular rhythm, normal heart sounds and intact distal pulses.  Exam reveals no gallop and no friction rub.   No murmur heard. Pulmonary/Chest: Effort normal and breath sounds normal. He has no wheezes. He has no rales.  Abdominal: Soft. Bowel sounds are normal. He exhibits no distension and no mass. There is no tenderness. There is no rebound and no guarding.  Lymphadenopathy:    He has no cervical adenopathy.  Neurological: He is alert and oriented to person, place, and time. No cranial nerve deficit.  Skin: Skin is warm and dry. No rash noted. He is not diaphoretic.  Psychiatric: He has a normal mood and affect. His behavior is normal.  Nursing note and vitals reviewed.  Results for orders placed or performed in visit on 01/09/15  CBC with Differential/Platelet  Result Value Ref Range   WBC 7.1 4.0 - 10.5 K/uL   RBC 5.38 4.22 - 5.81 MIL/uL   Hemoglobin 16.3 13.0 - 17.0 g/dL   HCT 47.8 39.0 - 52.0 %   MCV 88.8 78.0 - 100.0 fL   MCH 30.3 26.0 - 34.0 pg   MCHC 34.1 30.0 - 36.0 g/dL   RDW 12.9 11.5 - 15.5 %   Platelets 251 150  - 400 K/uL   MPV 11.7 8.6 - 12.4 fL   Neutrophils Relative % 61 43 - 77 %   Neutro Abs 4.3 1.7 - 7.7 K/uL   Lymphocytes Relative 30 12 - 46 %   Lymphs Abs 2.1 0.7 - 4.0 K/uL   Monocytes Relative 8 3 - 12 %   Monocytes Absolute 0.6 0.1 - 1.0 K/uL   Eosinophils Relative 1 0 - 5 %   Eosinophils Absolute 0.1 0.0 - 0.7 K/uL   Basophils Relative 0 0 - 1 %   Basophils Absolute 0.0 0.0 - 0.1 K/uL   Smear Review Criteria for review not met   Comprehensive metabolic panel  Result Value Ref Range   Sodium 139 135 - 146 mmol/L   Potassium 5.0 3.5 - 5.3 mmol/L   Chloride 103 98 - 110 mmol/L   CO2 28 20 - 31 mmol/L   Glucose, Bld 98 65 - 99 mg/dL   BUN 8 7 - 25 mg/dL   Creat 0.99 0.70 - 1.25 mg/dL   Total Bilirubin 1.0 0.2 - 1.2 mg/dL   Alkaline Phosphatase 87 40 - 115 U/L   AST 23 10 - 35 U/L   ALT 22 9 - 46 U/L   Total Protein 7.0 6.1 - 8.1 g/dL   Albumin 4.2 3.6 - 5.1 g/dL   Calcium 9.5 8.6 - 10.3 mg/dL  Hemoglobin A1c  Result Value Ref Range   Hgb A1c MFr Bld 6.1 (H) <5.7 %   Mean Plasma Glucose 128 (H) <117 mg/dL  Lipid panel  Result Value Ref Range   Cholesterol 157 125 - 200 mg/dL   Triglycerides 181 (H) <150 mg/dL   HDL 36 (L) >=40 mg/dL   Total CHOL/HDL Ratio 4.4 <=5.0 Ratio   VLDL 36 (H) <30 mg/dL   LDL Cholesterol 85 <130 mg/dL  Hepatitis C antibody  Result Value Ref Range   HCV Ab NEGATIVE NEGATIVE       Assessment &  Plan:   1. Anxiety and depression   2. Glucose intolerance (impaired glucose tolerance)    -improving. -continue current dose medication. -refill of Xanax provided.   No orders of the defined types were placed in this encounter.   Meds ordered this encounter  Medications  . ALPRAZolam (XANAX) 0.5 MG tablet    Sig: Take 1 tablet (0.5 mg total) by mouth at bedtime as needed.    Dispense:  30 tablet    Refill:  5    Return in about 3 months (around 05/14/2015) for recheck.    Akira Perusse Elayne Guerin, M.D. [facility] [address] [place], [place]  [address] [phone] phone [phone] fax

## 2015-03-06 ENCOUNTER — Other Ambulatory Visit: Payer: Self-pay | Admitting: Family Medicine

## 2015-03-14 ENCOUNTER — Other Ambulatory Visit: Payer: Self-pay | Admitting: Family Medicine

## 2015-03-23 ENCOUNTER — Other Ambulatory Visit: Payer: Self-pay | Admitting: Family Medicine

## 2015-03-26 ENCOUNTER — Encounter: Payer: Self-pay | Admitting: Family Medicine

## 2015-03-28 ENCOUNTER — Telehealth: Payer: Self-pay | Admitting: Family Medicine

## 2015-03-28 NOTE — Telephone Encounter (Signed)
Patient wife stated they have plans on 09/05/2015 and will not be able to make the appointment. Mrs.Leavy request for husband physical to be scheduled the week before. I stated to the patient I will have to discuss the request with Dr. Tamala Julian. Please discuss the request with Dr. Tamala Julian. Thank you. Please call patient for appointment change.

## 2015-04-04 ENCOUNTER — Encounter: Payer: Self-pay | Admitting: Family Medicine

## 2015-04-04 NOTE — Telephone Encounter (Signed)
Please schedule patient for CPE on 6/20 at 11:15; this should also remove the 11:30 slot.

## 2015-04-04 NOTE — Telephone Encounter (Signed)
Per Dr Tamala Julian scheduled appointment for patient.  Called and LMVM for patient of time and date of appt.  08/29/15 at 11:15 CPE.

## 2015-05-30 DIAGNOSIS — H5203 Hypermetropia, bilateral: Secondary | ICD-10-CM | POA: Diagnosis not present

## 2015-05-30 DIAGNOSIS — H53461 Homonymous bilateral field defects, right side: Secondary | ICD-10-CM | POA: Diagnosis not present

## 2015-05-30 DIAGNOSIS — Z9849 Cataract extraction status, unspecified eye: Secondary | ICD-10-CM | POA: Diagnosis not present

## 2015-05-30 DIAGNOSIS — Z961 Presence of intraocular lens: Secondary | ICD-10-CM | POA: Diagnosis not present

## 2015-05-30 DIAGNOSIS — H524 Presbyopia: Secondary | ICD-10-CM | POA: Diagnosis not present

## 2015-05-30 DIAGNOSIS — H52223 Regular astigmatism, bilateral: Secondary | ICD-10-CM | POA: Diagnosis not present

## 2015-05-31 DIAGNOSIS — Z08 Encounter for follow-up examination after completed treatment for malignant neoplasm: Secondary | ICD-10-CM | POA: Diagnosis not present

## 2015-05-31 DIAGNOSIS — X32XXXA Exposure to sunlight, initial encounter: Secondary | ICD-10-CM | POA: Diagnosis not present

## 2015-05-31 DIAGNOSIS — L728 Other follicular cysts of the skin and subcutaneous tissue: Secondary | ICD-10-CM | POA: Diagnosis not present

## 2015-05-31 DIAGNOSIS — L570 Actinic keratosis: Secondary | ICD-10-CM | POA: Diagnosis not present

## 2015-05-31 DIAGNOSIS — Z85828 Personal history of other malignant neoplasm of skin: Secondary | ICD-10-CM | POA: Diagnosis not present

## 2015-06-02 ENCOUNTER — Ambulatory Visit: Payer: Self-pay | Admitting: Family Medicine

## 2015-06-20 ENCOUNTER — Other Ambulatory Visit: Payer: Self-pay | Admitting: Family Medicine

## 2015-06-20 ENCOUNTER — Other Ambulatory Visit (INDEPENDENT_AMBULATORY_CARE_PROVIDER_SITE_OTHER): Payer: PPO | Admitting: Family Medicine

## 2015-06-20 ENCOUNTER — Encounter: Payer: Self-pay | Admitting: Family Medicine

## 2015-06-20 ENCOUNTER — Ambulatory Visit: Payer: Self-pay | Admitting: Family Medicine

## 2015-06-20 DIAGNOSIS — R7302 Impaired glucose tolerance (oral): Secondary | ICD-10-CM

## 2015-06-20 DIAGNOSIS — K219 Gastro-esophageal reflux disease without esophagitis: Secondary | ICD-10-CM

## 2015-06-20 DIAGNOSIS — E7800 Pure hypercholesterolemia, unspecified: Secondary | ICD-10-CM

## 2015-06-20 DIAGNOSIS — R7309 Other abnormal glucose: Secondary | ICD-10-CM

## 2015-06-20 LAB — COMPREHENSIVE METABOLIC PANEL WITH GFR
ALT: 20 U/L (ref 9–46)
AST: 23 U/L (ref 10–35)
Albumin: 4.3 g/dL (ref 3.6–5.1)
Alkaline Phosphatase: 79 U/L (ref 40–115)
BUN: 10 mg/dL (ref 7–25)
CO2: 26 mmol/L (ref 20–31)
Calcium: 9.6 mg/dL (ref 8.6–10.3)
Chloride: 104 mmol/L (ref 98–110)
Creat: 1.04 mg/dL (ref 0.70–1.25)
Glucose, Bld: 104 mg/dL — ABNORMAL HIGH (ref 65–99)
Potassium: 5.2 mmol/L (ref 3.5–5.3)
Sodium: 139 mmol/L (ref 135–146)
Total Bilirubin: 0.9 mg/dL (ref 0.2–1.2)
Total Protein: 7.2 g/dL (ref 6.1–8.1)

## 2015-06-20 LAB — LIPID PANEL
CHOL/HDL RATIO: 4.3 ratio (ref <=5.0)
Cholesterol: 173 mg/dL (ref 125–200)
HDL: 40 mg/dL (ref >=40)
LDL Cholesterol: 100 mg/dL (ref <130)
Triglycerides: 163 mg/dL — ABNORMAL HIGH (ref <150)
VLDL: 33 mg/dL — AB (ref <30)

## 2015-06-20 LAB — CBC WITH DIFFERENTIAL/PLATELET
Basophils Absolute: 0 {cells}/uL (ref 0–200)
Basophils Relative: 0 %
Eosinophils Absolute: 148 {cells}/uL (ref 15–500)
Eosinophils Relative: 2 %
HCT: 46.1 % (ref 38.5–50.0)
Hemoglobin: 15.5 g/dL (ref 13.2–17.1)
Lymphocytes Relative: 32 %
Lymphs Abs: 2368 {cells}/uL (ref 850–3900)
MCH: 30 pg (ref 27.0–33.0)
MCHC: 33.6 g/dL (ref 32.0–36.0)
MCV: 89.3 fL (ref 80.0–100.0)
MPV: 11.4 fL (ref 7.5–12.5)
Monocytes Absolute: 666 {cells}/uL (ref 200–950)
Monocytes Relative: 9 %
Neutro Abs: 4218 {cells}/uL (ref 1500–7800)
Neutrophils Relative %: 57 %
Platelets: 266 K/uL (ref 140–400)
RBC: 5.16 MIL/uL (ref 4.20–5.80)
RDW: 13.2 % (ref 11.0–15.0)
WBC: 7.4 K/uL (ref 3.8–10.8)

## 2015-06-20 LAB — HEMOGLOBIN A1C
Hgb A1c MFr Bld: 5.5 % (ref <5.7)
MEAN PLASMA GLUCOSE: 111 mg/dL

## 2015-06-20 MED ORDER — CLOPIDOGREL BISULFATE 75 MG PO TABS: 75.0000 mg | ORAL_TABLET | Freq: Every day | ORAL | Status: DC

## 2015-06-20 MED ORDER — ATORVASTATIN CALCIUM 40 MG PO TABS: 40.0000 mg | ORAL_TABLET | Freq: Every day | ORAL | Status: DC

## 2015-06-20 MED ORDER — OMEPRAZOLE 40 MG PO CPDR: 40.0000 mg | DELAYED_RELEASE_CAPSULE | Freq: Every day | ORAL | Status: DC

## 2015-06-20 NOTE — Progress Notes (Signed)
Subjective:    Patient ID: Tyrone Curtis, male    DOB: 01-Oct-1950, 65 y.o.   MRN: [id number]  06/20/2015  Labs Only   HPI This 65 y.o. male presents for follow-up of the following:   1.  Anxiety and depression:  Patient reports good compliance with medication, good tolerance to medication, and good symptom control.  Doing much better.  Less irritable.    2.  Hypercholesterolemia: Patient reports good compliance with medication, good tolerance to medication, and good symptom control.    3.  Glucose intolerance:   4.  Fall/L lower buttocks   Sister with esophageal varices due to NASH.  Mother also with liver failure from diabetes/NASH.     Review of Systems  Constitutional: Negative for fever, chills, diaphoresis, activity change, appetite change and fatigue.  Respiratory: Negative for cough and shortness of breath.   Cardiovascular: Negative for chest pain, palpitations and leg swelling.  Gastrointestinal: Negative for nausea, vomiting, abdominal pain and diarrhea.  Endocrine: Negative for cold intolerance, heat intolerance, polydipsia, polyphagia and polyuria.  Skin: Negative for color change, rash and wound.  Neurological: Negative for dizziness, tremors, seizures, syncope, facial asymmetry, speech difficulty, weakness, light-headedness, numbness and headaches.  Psychiatric/Behavioral: Negative for sleep disturbance and dysphoric mood. The patient is not nervous/anxious.     Past Medical History  Diagnosis Date  . CVA (cerebral vascular accident) ([place])   . Arthritis   . 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 Erectile dysfunction   . Anxiety state, unspecified   . Regurgitation   . Dysphagia, unspecified(787.20)   . Herpes zoster without mention of complication   . Pure hypercholesterolemia   . Allergic rhinitis, cause unspecified   . Basal cell carcinoma     facial  . Personal history of colonic polyps   . Internal hemorrhoids  without mention of complication   . Measles   . Chicken pox   . Mumps     x 2  . Depression   . Cataract   . Allergy    Past Surgical History  Procedure Laterality Date  . Small bowel mass  1991    removed part of small intestine benign mass  . Basal cell carcinoma of anterior chest  2000    resection; [facility]  . Cataract surgery  2010    with lens repacement Left  . Small intestine surgery    . Colonoscopy  09/23/12    normal.  Symptoms: anemia, hemoccult+.  [facility].  . Esophagogastroduodenoscopy  09/23/12    normal.  Symptoms: anemia, hemoccult +, GERD.   Allergies  Allergen Reactions  . Zyrtec D [Cetirizine-Pseudoephedrine Er]     "talking out of his head"   Current Outpatient Prescriptions  Medication Sig Dispense Refill  . ALPRAZolam (XANAX) 0.5 MG tablet Take 1 tablet (0.5 mg total) by mouth at bedtime as needed. 30 tablet 5  . aspirin 325 MG EC tablet Take 81 mg by mouth daily.     Marland Kitchen atorvastatin (LIPITOR) 40 MG tablet Take 1 tablet by mouth every day 30 tablet 3  . atorvastatin (LIPITOR) 40 MG tablet Take 1 tablet (40 mg total) by mouth daily. 90 tablet 3  . clopidogrel (PLAVIX) 75 MG tablet Take 1 tablet (75 mg total) by mouth daily. 90 tablet 3  . fluticasone (FLONASE) 50 MCG/ACT nasal spray Place 2 sprays into both nostrils daily. 16 g 10  . hydrocortisone (ANUSOL-HC) 25 MG suppository Place 1 suppository (25 mg  total) rectally 2 (two) times daily. (Patient not taking: Reported on 02/13/2015) 12 suppository 2  . hydrOXYzine (ATARAX/VISTARIL) 25 MG tablet Take 0.5-1 tablets (12.5-25 mg total) by mouth at bedtime as needed. 30 tablet 0  . loratadine (CLARITIN) 10 MG tablet Take 10 mg by mouth daily.    . Multiple Vitamin (MULTIVITAMINS PO) Take by mouth daily.    Marland Kitchen omeprazole (PRILOSEC) 40 MG capsule Take 1 capsule (40 mg total) by mouth daily. 90 capsule 3  . sertraline (ZOLOFT) 50 MG tablet Take 1 tablet (50 mg total) by mouth daily. 90 tablet 1  . zoster  vaccine live, PF, (ZOSTAVAX) 20254 UNT/0.65ML injection Inject 19,400 Units into the skin once. 1 each 0  . zoster vaccine live, PF, (ZOSTAVAX) 27062 UNT/0.65ML injection Inject 19,400 Units into the skin once. (Patient not taking: Reported on 01/09/2015) 0.65 mL 0   No current facility-administered medications for this visit.   Social History   Social History  . Marital Status: Married    Spouse Name: N/A  . Number of Children: 1  . Years of Education: N/A   Occupational History  . Disability     CVA  . Previous owner of [employer]    Social History Main Topics  . Smoking status: Former Smoker -- 2.00 packs/day for 25 years    Types: Cigarettes  . Smokeless tobacco: Not on file     Comment: quit in 1984  . Alcohol Use: No     Comment: 3-5 drinks  . Drug Use: No  . Sexual Activity: Not on file   Other Topics Concern  . Not on file   Social History Narrative   Always uses seat belts. Smoke alarm and carbon monoxide detector in home.No Guns. Caffeine: Coffee 5 x daily.Married x 37 years Happily. Lives with spouse bought a beach house at [employer] in 2011. Also has mountain house in [place]. 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 Exercise: 3 x week walking; 2 miles daily with wife, during winter months in gym-gazelle and stair-master. Pt doing PT 30 - 45 mins per day.   Family History  Problem Relation Age of Onset  . Hyperlipidemia Sister   . Mental illness Sister   . Mental retardation Sister     anxiety  . Hyperlipidemia Brother   . Diabetes Brother   . Arthritis Brother     Knee replacement  . Bipolar disorder Brother   . Diabetes Mother   . Depression Mother   . Esophageal varices Mother   . Hyperlipidemia Mother   . Mental illness Mother   . Cancer Father     unknown       Objective:    There were no vitals taken for this visit. Physical Exam  Constitutional: He is oriented to person, place, and time. He appears  well-developed and well-nourished. No distress.  HENT:  Head: Normocephalic and atraumatic.  Right Ear: External ear normal.  Left Ear: External ear normal.  Nose: Nose normal.  Mouth/Throat: Oropharynx is clear and moist.  Eyes: Conjunctivae and EOM are normal. Pupils are equal, round, and reactive to light.  Neck: Normal range of motion. Neck supple. Carotid bruit is not present. No thyromegaly present.  Cardiovascular: Normal rate, regular rhythm, normal heart sounds and intact distal pulses.  Exam reveals no gallop and no friction rub.   No murmur heard. Pulmonary/Chest: Effort normal and breath sounds normal. He has no wheezes. He has no rales.  Abdominal: Soft. Bowel sounds are normal. He exhibits no distension and no mass. There is no tenderness. There is no rebound and no guarding.  Lymphadenopathy:    He has no cervical adenopathy.  Neurological: He is alert and oriented to person, place, and time. No cranial nerve deficit.  Skin: Skin is warm and dry. No rash noted. He is not diaphoretic.  Psychiatric: He has a normal mood and affect. His behavior is normal.  Nursing note and vitals reviewed.  Results for orders placed or performed in visit on 01/09/15  CBC with Differential/Platelet  Result Value Ref Range   WBC 7.1 4.0 - 10.5 K/uL   RBC 5.38 4.22 - 5.81 MIL/uL   Hemoglobin 16.3 13.0 - 17.0 g/dL   HCT 47.8 39.0 - 52.0 %   MCV 88.8 78.0 - 100.0 fL   MCH 30.3 26.0 - 34.0 pg   MCHC 34.1 30.0 - 36.0 g/dL   RDW 12.9 11.5 - 15.5 %   Platelets 251 150 - 400 K/uL   MPV 11.7 8.6 - 12.4 fL   Neutrophils Relative % 61 43 - 77 %   Neutro Abs 4.3 1.7 - 7.7 K/uL   Lymphocytes Relative 30 12 - 46 %   Lymphs Abs 2.1 0.7 - 4.0 K/uL   Monocytes Relative 8 3 - 12 %   Monocytes Absolute 0.6 0.1 - 1.0 K/uL   Eosinophils Relative 1 0 - 5 %   Eosinophils Absolute 0.1 0.0 - 0.7 K/uL   Basophils Relative 0 0 - 1 %   Basophils Absolute 0.0 0.0 - 0.1 K/uL   Smear Review Criteria for review  not met   Comprehensive metabolic panel  Result Value Ref Range   Sodium 139 135 - 146 mmol/L   Potassium 5.0 3.5 - 5.3 mmol/L   Chloride 103 98 - 110 mmol/L   CO2 28 20 - 31 mmol/L   Glucose, Bld 98 65 - 99 mg/dL   BUN 8 7 - 25 mg/dL   Creat 0.99 0.70 - 1.25 mg/dL   Total Bilirubin 1.0 0.2 - 1.2 mg/dL   Alkaline Phosphatase 87 40 - 115 U/L   AST 23 10 - 35 U/L   ALT 22 9 - 46 U/L   Total Protein 7.0 6.1 - 8.1 g/dL   Albumin 4.2 3.6 - 5.1 g/dL   Calcium 9.5 8.6 - 10.3 mg/dL  Hemoglobin A1c  Result Value Ref Range   Hgb A1c MFr Bld 6.1 (H) <5.7 %   Mean Plasma Glucose 128 (H) <117 mg/dL  Lipid panel  Result Value Ref Range   Cholesterol 157 125 - 200 mg/dL   Triglycerides 181 (H) <150 mg/dL   HDL 36 (L) >=40 mg/dL   Total CHOL/HDL Ratio 4.4 <=5.0 Ratio   VLDL 36 (H) <30 mg/dL   LDL Cholesterol 85 <130 mg/dL  Hepatitis C antibody  Result Value Ref Range   HCV Ab NEGATIVE NEGATIVE       Assessment & Plan:   1. Pure hypercholesterolemia   2. Glucose intolerance (impaired glucose tolerance)     No orders of the defined types were placed in this encounter.   No orders of the defined types were placed in this encounter.    No Follow-up on file.    Kristi Elayne Guerin, M.D. [facility] [address] [place], [place]  [address] [phone] phone [phone] fax

## 2015-06-21 ENCOUNTER — Encounter: Payer: Self-pay | Admitting: Family Medicine

## 2015-06-25 ENCOUNTER — Encounter: Payer: Self-pay | Admitting: Family Medicine

## 2015-08-04 ENCOUNTER — Encounter: Payer: PPO | Admitting: Family Medicine

## 2015-08-11 ENCOUNTER — Other Ambulatory Visit: Payer: Self-pay | Admitting: Family Medicine

## 2015-08-13 ENCOUNTER — Encounter: Payer: Self-pay | Admitting: Family Medicine

## 2015-08-14 NOTE — Telephone Encounter (Signed)
Patient's spouse is calling to follow up on refill request. She states they're leaving out of town on Wednesday 6/7. She also wants Dr. Tamala Julian to look at the [facility] message in regards to patient having diarrhea for over a month. [phone]

## 2015-08-15 ENCOUNTER — Telehealth: Payer: Self-pay | Admitting: *Deleted

## 2015-08-15 NOTE — Telephone Encounter (Signed)
Faxed to [facility] in [place], I spoke to patient to advise him RX was faxed.

## 2015-08-15 NOTE — Telephone Encounter (Signed)
Called patient left message in voice mail Xanax will be faxed to [facility] on [address] [place].

## 2015-08-15 NOTE — Telephone Encounter (Signed)
Please call in or fax refill of Xanax to pharmacy. 

## 2015-08-15 NOTE — Telephone Encounter (Signed)
Spoke to wife, who reported that pt's diarrhea has been somewhat improved since he has been eating a better diet and "listening to me about what he eats". They are going out of town tomorrow morning and won't be back until Sunday. She was planning to get some immodium for pt to take during the trip if he has a problem. Wife would like Dr Tamala Julian to let them know if she does not want him taking the immodium. I advised that pt should be seen if his diarrhea does not resolve when they return to town, but will still forward to Dr Tamala Julian to advise about ability to move up appt.

## 2015-08-15 NOTE — Telephone Encounter (Signed)
Called in. Spoke to wife and advised. Also advised pt's email was forwarded to Dr Tamala Julian.

## 2015-08-21 ENCOUNTER — Encounter: Payer: Self-pay | Admitting: Family Medicine

## 2015-08-29 ENCOUNTER — Encounter: Payer: Self-pay | Admitting: Family Medicine

## 2015-08-29 ENCOUNTER — Ambulatory Visit (INDEPENDENT_AMBULATORY_CARE_PROVIDER_SITE_OTHER): Payer: PPO | Admitting: Family Medicine

## 2015-08-29 VITALS — BP 144/74 | HR 67 | Temp 98.7°F | Resp 16 | Ht 70.5 in | Wt 210.0 lb

## 2015-08-29 DIAGNOSIS — K219 Gastro-esophageal reflux disease without esophagitis: Secondary | ICD-10-CM | POA: Diagnosis not present

## 2015-08-29 DIAGNOSIS — R7309 Other abnormal glucose: Secondary | ICD-10-CM

## 2015-08-29 DIAGNOSIS — E7800 Pure hypercholesterolemia, unspecified: Secondary | ICD-10-CM

## 2015-08-29 DIAGNOSIS — Z125 Encounter for screening for malignant neoplasm of prostate: Secondary | ICD-10-CM | POA: Diagnosis not present

## 2015-08-29 DIAGNOSIS — Z8673 Personal history of transient ischemic attack (TIA), and cerebral infarction without residual deficits: Secondary | ICD-10-CM | POA: Diagnosis not present

## 2015-08-29 DIAGNOSIS — Z0000 Encounter for general adult medical examination without abnormal findings: Secondary | ICD-10-CM

## 2015-08-29 DIAGNOSIS — J301 Allergic rhinitis due to pollen: Secondary | ICD-10-CM | POA: Diagnosis not present

## 2015-08-29 DIAGNOSIS — G4700 Insomnia, unspecified: Secondary | ICD-10-CM | POA: Diagnosis not present

## 2015-08-29 LAB — CBC WITH DIFFERENTIAL/PLATELET
BASOS ABS: 0 {cells}/uL (ref 0–200)
Basophils Relative: 0 %
EOS ABS: 84 {cells}/uL (ref 15–500)
EOS PCT: 1 %
HCT: 47.9 % (ref 38.5–50.0)
HEMOGLOBIN: 16.2 g/dL (ref 13.2–17.1)
Lymphocytes Relative: 26 %
Lymphs Abs: 2184 cells/uL (ref 850–3900)
MCH: 30.4 pg (ref 27.0–33.0)
MCHC: 33.8 g/dL (ref 32.0–36.0)
MCV: 89.9 fL (ref 80.0–100.0)
MONOS PCT: 8 %
MPV: 11.9 fL (ref 7.5–12.5)
Monocytes Absolute: 672 cells/uL (ref 200–950)
NEUTROS PCT: 65 %
Neutro Abs: 5460 cells/uL (ref 1500–7800)
PLATELETS: 249 10*3/uL (ref 140–400)
RBC: 5.33 MIL/uL (ref 4.20–5.80)
RDW: 13.4 % (ref 11.0–15.0)
WBC: 8.4 10*3/uL (ref 3.8–10.8)

## 2015-08-29 LAB — COMPREHENSIVE METABOLIC PANEL
ALBUMIN: 4.4 g/dL (ref 3.6–5.1)
ALT: 19 U/L (ref 9–46)
AST: 21 U/L (ref 10–35)
Alkaline Phosphatase: 85 U/L (ref 40–115)
BUN: 7 mg/dL (ref 7–25)
CHLORIDE: 103 mmol/L (ref 98–110)
CO2: 24 mmol/L (ref 20–31)
CREATININE: 0.97 mg/dL (ref 0.70–1.25)
Calcium: 9.4 mg/dL (ref 8.6–10.3)
Glucose, Bld: 82 mg/dL (ref 65–99)
POTASSIUM: 4.4 mmol/L (ref 3.5–5.3)
SODIUM: 140 mmol/L (ref 135–146)
TOTAL PROTEIN: 7.4 g/dL (ref 6.1–8.1)
Total Bilirubin: 0.8 mg/dL (ref 0.2–1.2)

## 2015-08-29 LAB — POC MICROSCOPIC URINALYSIS (UMFC): MUCUS RE: ABSENT

## 2015-08-29 LAB — POCT URINALYSIS DIP (MANUAL ENTRY)
Bilirubin, UA: NEGATIVE
Blood, UA: NEGATIVE
GLUCOSE UA: NEGATIVE
Ketones, POC UA: NEGATIVE
LEUKOCYTES UA: NEGATIVE
NITRITE UA: NEGATIVE
PROTEIN UA: NEGATIVE
Spec Grav, UA: <=1.005
UROBILINOGEN UA: 0.2
pH, UA: 6.5

## 2015-08-29 MED ORDER — SERTRALINE HCL 50 MG PO TABS: 50.0000 mg | ORAL_TABLET | Freq: Every day | ORAL | Status: DC

## 2015-08-29 MED ORDER — ALPRAZOLAM 0.5 MG PO TABS: 0.5000 mg | ORAL_TABLET | Freq: Every evening | ORAL | Status: DC | PRN

## 2015-08-29 NOTE — Patient Instructions (Addendum)
   IF you received an x-ray today, you will receive an invoice from [facility]. Please contact [facility] at [phone] with questions or concerns regarding your invoice.   IF you received labwork today, you will receive an invoice from [facility]. Please contact [facility] at [phone] with questions or concerns regarding your invoice.   Our billing staff will not be able to assist you with questions regarding bills from these companies.  You will be contacted with the lab results as soon as they are available. The fastest way to get your results is to activate your My Chart account. Instructions are located on the last page of this paperwork. If you have not heard from us regarding the results in 2 weeks, please contact this office.    Keeping you healthy  Get these tests  Blood pressure- Have your blood pressure checked once a year by your healthcare provider.  Normal blood pressure is 120/80  Weight- Have your body mass index (BMI) calculated to screen for obesity.  BMI is a measure of body fat based on height and weight. You can also calculate your own BMI at [email or web address]/.  Cholesterol- Have your cholesterol checked every year.  Diabetes- Have your blood sugar checked regularly if you have high blood pressure, high cholesterol, have a family history of diabetes or if you are overweight.  Screening for Colon Cancer- Colonoscopy starting at age 50.  Screening may begin sooner depending on your family history and other health conditions. Follow up colonoscopy as directed by your Gastroenterologist.  Screening for Prostate Cancer- Both blood work (PSA) and a rectal exam help screen for Prostate Cancer.  Screening begins at age 40 with African-American men and at age 50 with Caucasian men.  Screening may begin sooner depending on your family history.  Take these medicines  Aspirin- One aspirin daily can help prevent Heart  disease and Stroke.  Flu shot- Every fall.  Tetanus- Every 10 years.  Zostavax- Once after the age of 60 to prevent Shingles.  Pneumonia shot- Once after the age of 65; if you are younger than 65, ask your healthcare provider if you need a Pneumonia shot.  Take these steps  Don't smoke- If you do smoke, talk to your doctor about quitting.  For tips on how to quit, go to www.smokefree.gov or call 1-800-QUIT-NOW.  Be physically active- Exercise 5 days a week for at least 30 minutes.  If you are not already physically active start slow and gradually work up to 30 minutes of moderate physical activity.  Examples of moderate activity include walking briskly, mowing the yard, dancing, swimming, bicycling, etc.  Eat a healthy diet- Eat a variety of healthy food such as fruits, vegetables, low fat milk, low fat cheese, yogurt, lean meant, poultry, fish, beans, tofu, etc. For more information go to www.thenutritionsource.org  Drink alcohol in moderation- Limit alcohol intake to less than two drinks a day. Never drink and drive.  Dentist- Brush and floss twice daily; visit your dentist twice a year.  Depression- Your emotional health is as important as your physical health. If you're feeling down, or losing interest in things you would normally enjoy please talk to your healthcare provider.  Eye exam- Visit your eye doctor every year.  Safe sex- If you may be exposed to a sexually transmitted infection, use a condom.  Seat belts- Seat belts can save your life; always wear one.  Smoke/Carbon Monoxide detectors- These detectors need to be installed on   the appropriate level of your home.  Replace batteries at least once a year.  Skin cancer- When out in the sun, cover up and use sunscreen 15 SPF or higher.  Violence- If anyone is threatening you, please tell your healthcare provider.  Living Will/ Health care power of attorney- Speak with your healthcare provider and family. 

## 2015-08-29 NOTE — Progress Notes (Signed)
Subjective:    Patient ID: Tyrone Curtis, male    DOB: 1950/07/22, 65 y.o.   MRN: [id number]  08/29/2015  Annual Exam and Medication Refill   HPI This 65 y.o. male presents for Annual Wellness Examination and chronic medical follow-up:  Last physical: 08-01-2014 Colonoscopy: 09-24-2012 TDAP: 2013 Pneumovax:  08-01-2014 Prevnar 13; due for Pneumovax Zostavax:  2016 Influenza: 2016 Eye exam: Dental exam:  Hypercholesterolemia: Patient reports good compliance with medication, good tolerance to medication, and good symptom control.    Glucose Intolerance: HgbA1c improved at 5.5.  History of CVA:   Anxiety: Patient reports good compliance with medication, good tolerance to medication, and good symptom control.  Sertraline is working very well but no libido.  Emotionally doing really well.  Able to concentrate really well.  Not dwelling on issues as much; not over anxious.  Taking Alprazolam qhs to help sleep.    Allergic Rhinitis: Patient reports good compliance with medication, good tolerance to medication, and good symptom control.    GERD; swallowing is good.    External hemorrhoids: had hemorrhoidal issues.  Doing a lot of yard work and a lot of lifting.    Diarrhea: had a bout of diarrhea; several weeks duration; had been eating a lot of junk; eating a lot of watermelon and grapes.  Four week duration; having 3-4 stools per day; sometimes 5-6.  One daily b.m.  No bloody stools or black stools.  Liquid than formed.  Drinking a lot of water and tea hot green.  Now having two stools per day; maybe 3 times per day.  No urgency.   Review of Systems  Constitutional: Negative for fever, chills, diaphoresis, activity change, appetite change, fatigue and unexpected weight change.  HENT: Negative for congestion, dental problem, drooling, ear discharge, ear pain, facial swelling, hearing loss, mouth sores, nosebleeds, postnasal drip, rhinorrhea, sinus pressure, sneezing, sore throat,  tinnitus, trouble swallowing and voice change.   Eyes: Negative for photophobia, pain, discharge, redness, itching and visual disturbance.  Respiratory: Negative for apnea, cough, choking, chest tightness, shortness of breath, wheezing and stridor.   Cardiovascular: Negative for chest pain, palpitations and leg swelling.  Gastrointestinal: Negative for nausea, vomiting, abdominal pain, diarrhea, constipation and blood in stool.  Endocrine: Negative for cold intolerance, heat intolerance, polydipsia, polyphagia and polyuria.  Genitourinary: Negative for dysuria, urgency, frequency, hematuria, flank pain, decreased urine volume, discharge, penile swelling, scrotal swelling, enuresis, difficulty urinating, genital sores, penile pain and testicular pain.  Musculoskeletal: Negative for myalgias, back pain, joint swelling, arthralgias, gait problem, neck pain and neck stiffness.  Skin: Negative for color change, pallor, rash and wound.  Allergic/Immunologic: Negative for environmental allergies, food allergies and immunocompromised state.  Neurological: Negative for dizziness, tremors, seizures, syncope, facial asymmetry, speech difficulty, weakness, light-headedness, numbness and headaches.  Hematological: Negative for adenopathy. Does not bruise/bleed easily.  Psychiatric/Behavioral: Negative for suicidal ideas, hallucinations, behavioral problems, confusion, sleep disturbance, self-injury, dysphoric mood, decreased concentration and agitation. The patient is not nervous/anxious and is not hyperactive.     Past Medical History  Diagnosis Date  . CVA (cerebral vascular accident) ([place])   . Arthritis   . 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 Erectile dysfunction   . Anxiety state, unspecified   . Regurgitation   . Dysphagia, unspecified(787.20)   . Herpes zoster without mention of complication   . Pure hypercholesterolemia   . Allergic rhinitis, cause unspecified   .  Basal cell carcinoma  facial  . Personal history of colonic polyps   . Internal hemorrhoids without mention of complication   . Measles   . Chicken pox   . Mumps     x 2  . Depression   . Cataract   . Allergy    Past Surgical History  Procedure Laterality Date  . Small bowel mass  1991    removed part of small intestine benign mass  . Basal cell carcinoma of anterior chest  2000    resection; [facility]  . Cataract surgery  2010    with lens repacement Left  . Small intestine surgery    . Colonoscopy  09/23/12    normal.  Symptoms: anemia, hemoccult+.  [facility].  . Esophagogastroduodenoscopy  09/23/12    normal.  Symptoms: anemia, hemoccult +, GERD.  Marland Kitchen Eye surgery Bilateral     cataract  . Fracture surgery Left     1957 left arm   Allergies  Allergen Reactions  . Zyrtec D [Cetirizine-Pseudoephedrine Er]     "talking out of his head"   Current Outpatient Prescriptions  Medication Sig Dispense Refill  . ALPRAZolam (XANAX) 0.5 MG tablet Take 1 tablet (0.5 mg total) by mouth at bedtime as needed. 90 tablet 1  . aspirin 325 MG EC tablet Take 81 mg by mouth daily.     Marland Kitchen atorvastatin (LIPITOR) 40 MG tablet Take 1 tablet (40 mg total) by mouth daily. 90 tablet 3  . fluticasone (FLONASE) 50 MCG/ACT nasal spray Place 2 sprays into both nostrils daily. 16 g 10  . loratadine (CLARITIN) 10 MG tablet Take 10 mg by mouth daily.    . Multiple Vitamin (MULTIVITAMINS PO) Take by mouth daily.    Marland Kitchen omeprazole (PRILOSEC) 40 MG capsule Take 1 capsule (40 mg total) by mouth daily. 90 capsule 3  . sertraline (ZOLOFT) 50 MG tablet Take 1 tablet (50 mg total) by mouth daily. 90 tablet 3  . atorvastatin (LIPITOR) 40 MG tablet Take 1 tablet by mouth every day 30 tablet 3   No current facility-administered medications for this visit.   Social History   Social History  . Marital Status: Married    Spouse Name: N/A  . Number of Children: 1  . Years of Education: N/A   Occupational  History  . Disability     CVA  . Previous owner of [employer]    Social History Main Topics  . Smoking status: Former Smoker -- 2.00 packs/day for 25 years    Types: Cigarettes  . Smokeless tobacco: Not on file     Comment: quit in 1984  . Alcohol Use: No     Comment: 3-5 drinks  . Drug Use: No  . Sexual Activity: Not on file   Other Topics Concern  . Not on file   Social History Narrative   Always uses seat belts. Smoke alarm and carbon monoxide detector in home.No Guns. Caffeine: Coffee 5 x daily.Married x 37 years Happily. Lives with spouse bought a [place] house [place] in 2011. Also has mountain house in [place]. 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 Exercise: 3 x week walking; 2 miles daily with wife, during winter months in gym-gazelle and stair-master. Pt doing PT 30 - 45 mins per day.   Family History  Problem Relation Age of Onset  . Hyperlipidemia Sister   . Mental illness Sister   . Mental retardation Sister     anxiety  .  Hyperlipidemia Brother   . Diabetes Brother   . Arthritis Brother     Knee replacement  . Bipolar disorder Brother   . Diabetes Mother   . Depression Mother   . Esophageal varices Mother   . Hyperlipidemia Mother   . Mental illness Mother   . Cancer Father     unknown       Objective:    BP 144/74 mmHg  Pulse 67  Temp(Src) 98.7 F (37.1 C) (Oral)  Resp 16  Ht 5' 10.5" (1.791 m)  Wt 210 lb (95.255 kg)  BMI 29.70 kg/m2  SpO2 97% Physical Exam  Constitutional: He is oriented to person, place, and time. He appears well-developed and well-nourished. No distress.  HENT:  Head: Normocephalic and atraumatic.  Right Ear: External ear normal.  Left Ear: External ear normal.  Nose: Nose normal.  Mouth/Throat: Oropharynx is clear and moist.  Eyes: Conjunctivae and EOM are normal. Pupils are equal, round, and reactive to light.  Neck: Normal range of motion. Neck supple. Carotid bruit is not present.  No thyromegaly present.  Cardiovascular: Normal rate, regular rhythm, normal heart sounds and intact distal pulses.  Exam reveals no gallop and no friction rub.   No murmur heard. Pulmonary/Chest: Effort normal and breath sounds normal. He has no wheezes. He has no rales.  Abdominal: Soft. Bowel sounds are normal. He exhibits no distension and no mass. There is no tenderness. There is no rebound and no guarding.  Musculoskeletal:       Right shoulder: Normal.       Left shoulder: Normal.       Cervical back: Normal.  Lymphadenopathy:    He has no cervical adenopathy.  Neurological: He is alert and oriented to person, place, and time. He has normal reflexes. No cranial nerve deficit. He exhibits normal muscle tone. Coordination normal.  Skin: Skin is warm and dry. No rash noted. He is not diaphoretic.  Psychiatric: He has a normal mood and affect. His behavior is normal. Judgment and thought content normal.   Depression screen [facility] 2/9 08/29/2015 01/09/2015 08/01/2014 03/09/2014 01/03/2014  Decreased Interest 0 1 0 0 0  Down, Depressed, Hopeless 0 1 0 0 0  PHQ - 2 Score 0 2 0 0 0  Altered sleeping - 3 - - -  Tired, decreased energy - 0 - - -  Change in appetite - 1 - - -  Feeling bad or failure about yourself  - 0 - - -  Trouble concentrating - 3 - - -  Moving slowly or fidgety/restless - 0 - - -  Suicidal thoughts - 0 - - -  PHQ-9 Score - 9 - - -   Fall Risk  08/29/2015 02/13/2015 01/09/2015 08/01/2014 03/09/2014  Falls in the past year? Yes No No No No  Number falls in past yr: 2 or more - - - -  Injury with Fall? Yes - - - -  Functional Status Survey: Is the patient deaf or have difficulty hearing?: Yes (bilateral) Does the patient have difficulty seeing, even when wearing glasses/contacts?: Yes Does the patient have difficulty concentrating, remembering, or making decisions?: No Does the patient have difficulty walking or climbing stairs?: No Does the patient have difficulty  dressing or bathing?: No Does the patient have difficulty doing errands alone such as visiting a doctor's office or shopping?: No      Assessment & Plan:   1. Encounter for Medicare annual wellness exam   2. Pure hypercholesterolemia  3. Other abnormal glucose   4. Allergic rhinitis due to pollen   5. Insomnia   6. History of CVA (cerebrovascular accident)   7. Gastroesophageal reflux disease without esophagitis   8. Screening for prostate cancer     Orders Placed This Encounter  Procedures  . CBC with Differential/Platelet  . Comprehensive metabolic panel  . PSA  . POCT urinalysis dipstick  . POCT Microscopic Urinalysis ([facility])   Meds ordered this encounter  Medications  . sertraline (ZOLOFT) 50 MG tablet    Sig: Take 1 tablet (50 mg total) by mouth daily.    Dispense:  90 tablet    Refill:  3  . ALPRAZolam (XANAX) 0.5 MG tablet    Sig: Take 1 tablet (0.5 mg total) by mouth at bedtime as needed.    Dispense:  90 tablet    Refill:  1    Return in about 6 months (around 02/28/2016) for recheck cholesterol, anxiety.    Rashaunda Rahl Elayne Guerin, M.D. [facility] [address] [place], [place]  [address] [phone] phone [phone] fax

## 2015-08-30 LAB — PSA: PSA: 1.29 ng/mL (ref <=4.00)

## 2015-09-05 ENCOUNTER — Encounter: Payer: PPO | Admitting: Family Medicine

## 2015-09-13 ENCOUNTER — Encounter: Payer: Self-pay | Admitting: Family Medicine

## 2015-12-28 ENCOUNTER — Encounter: Payer: Self-pay | Admitting: Family Medicine

## 2015-12-29 ENCOUNTER — Encounter: Payer: Self-pay | Admitting: Family Medicine

## 2016-01-15 ENCOUNTER — Ambulatory Visit: Payer: PPO | Admitting: Family Medicine

## 2016-01-16 ENCOUNTER — Encounter: Payer: Self-pay | Admitting: Family Medicine

## 2016-01-16 ENCOUNTER — Ambulatory Visit: Payer: PPO | Admitting: Family Medicine

## 2016-01-16 ENCOUNTER — Ambulatory Visit (INDEPENDENT_AMBULATORY_CARE_PROVIDER_SITE_OTHER): Payer: PPO | Admitting: Family Medicine

## 2016-01-16 VITALS — BP 110/64 | HR 64 | Temp 98.0°F | Resp 17 | Ht 70.5 in | Wt 215.0 lb

## 2016-01-16 DIAGNOSIS — K219 Gastro-esophageal reflux disease without esophagitis: Secondary | ICD-10-CM | POA: Diagnosis not present

## 2016-01-16 DIAGNOSIS — R7309 Other abnormal glucose: Secondary | ICD-10-CM

## 2016-01-16 DIAGNOSIS — J301 Allergic rhinitis due to pollen: Secondary | ICD-10-CM

## 2016-01-16 DIAGNOSIS — F5104 Psychophysiologic insomnia: Secondary | ICD-10-CM | POA: Diagnosis not present

## 2016-01-16 DIAGNOSIS — Z23 Encounter for immunization: Secondary | ICD-10-CM | POA: Diagnosis not present

## 2016-01-16 DIAGNOSIS — E7800 Pure hypercholesterolemia, unspecified: Secondary | ICD-10-CM | POA: Diagnosis not present

## 2016-01-16 LAB — CBC WITH DIFFERENTIAL/PLATELET
BASOS PCT: 0 %
Basophils Absolute: 0 cells/uL (ref 0–200)
EOS ABS: 73 {cells}/uL (ref 15–500)
EOS PCT: 1 %
HCT: 46.8 % (ref 38.5–50.0)
Hemoglobin: 15.9 g/dL (ref 13.2–17.1)
LYMPHS PCT: 24 %
Lymphs Abs: 1752 cells/uL (ref 850–3900)
MCH: 31.2 pg (ref 27.0–33.0)
MCHC: 34 g/dL (ref 32.0–36.0)
MCV: 91.9 fL (ref 80.0–100.0)
MONOS PCT: 11 %
MPV: 11 fL (ref 7.5–12.5)
Monocytes Absolute: 803 cells/uL (ref 200–950)
Neutro Abs: 4672 cells/uL (ref 1500–7800)
Neutrophils Relative %: 64 %
PLATELETS: 219 10*3/uL (ref 140–400)
RBC: 5.09 MIL/uL (ref 4.20–5.80)
RDW: 12.7 % (ref 11.0–15.0)
WBC: 7.3 10*3/uL (ref 3.8–10.8)

## 2016-01-16 LAB — LIPID PANEL
CHOL/HDL RATIO: 3.9 ratio (ref <5.0)
Cholesterol: 172 mg/dL (ref <200)
HDL: 44 mg/dL (ref >40)
LDL Cholesterol: 106 mg/dL — ABNORMAL HIGH
Triglycerides: 110 mg/dL (ref <150)
VLDL: 22 mg/dL (ref <30)

## 2016-01-16 LAB — COMPREHENSIVE METABOLIC PANEL
ALK PHOS: 67 U/L (ref 40–115)
ALT: 19 U/L (ref 9–46)
AST: 24 U/L (ref 10–35)
Albumin: 4.2 g/dL (ref 3.6–5.1)
BUN: 13 mg/dL (ref 7–25)
CO2: 26 mmol/L (ref 20–31)
Calcium: 9.6 mg/dL (ref 8.6–10.3)
Chloride: 106 mmol/L (ref 98–110)
Creat: 0.96 mg/dL (ref 0.70–1.25)
GLUCOSE: 108 mg/dL — AB (ref 65–99)
POTASSIUM: 5.2 mmol/L (ref 3.5–5.3)
SODIUM: 141 mmol/L (ref 135–146)
Total Bilirubin: 0.6 mg/dL (ref 0.2–1.2)
Total Protein: 6.9 g/dL (ref 6.1–8.1)

## 2016-01-16 LAB — HEMOGLOBIN A1C
HEMOGLOBIN A1C: 5.5 % (ref <5.7)
MEAN PLASMA GLUCOSE: 111 mg/dL

## 2016-01-16 MED ORDER — ALPRAZOLAM 0.5 MG PO TABS: 0.5000 mg | ORAL_TABLET | Freq: Every evening | ORAL | 1 refills | Status: DC | PRN

## 2016-01-16 NOTE — Progress Notes (Signed)
Subjective:    Patient ID: Tyrone Curtis, male    DOB: Jul 18, 1950, 65 y.o.   MRN: [id number]  01/16/2016  Follow-up (6 month/ blood work)   HPI This 65 y.o. male presents for five month follow-up of hypercholesterolemia, glucose intolerance, GERD and anxiety/depression.  [place] the beach for [place].  GERD is well controlled; has improved really well; for quite a while, symptoms worsened; lately fine. Emotionally doing well; writing a lot.  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  Stays very busy.  Mood can worsen in the winter months.  If walks every morning, mood is much better.  Sleeping very well.   S/p flu vaccine in office today.   Wt Readings from Last 3 Encounters:  01/16/16 215 lb (97.5 kg)  08/29/15 210 lb (95.3 kg)  06/20/15 216 lb 9.6 oz (98.2 kg)   BP Readings from Last 3 Encounters:  01/16/16 110/64  08/29/15 (!) 144/74  06/20/15 126/72    Review of Systems  Constitutional: Negative for activity change, appetite change, chills, diaphoresis, fatigue and fever.  Eyes: Negative for visual disturbance.  Respiratory: Negative for cough and shortness of breath.   Cardiovascular: Negative for chest pain, palpitations and leg swelling.  Endocrine: Negative for cold intolerance, heat intolerance, polydipsia, polyphagia and polyuria.  Neurological: Negative for dizziness, tremors, seizures, syncope, facial asymmetry, speech difficulty, weakness, light-headedness, numbness and headaches.  Psychiatric/Behavioral: Positive for sleep disturbance. Negative for dysphoric mood, self-injury and suicidal ideas. The patient is nervous/anxious.     Past Medical History:  Diagnosis Date  . Allergic rhinitis, cause unspecified   . Allergy   . Anxiety state, unspecified   . Arthritis   . Basal cell carcinoma    facial  . Cataract   . Chicken pox   . CVA (cerebral vascular accident) ([place])   . Depression   . Diaphragmatic hernia without mention of  obstruction or gangrene   . Dysphagia, unspecified(787.20)   . Erectile dysfunction   . Herpes zoster without mention of complication   . Internal hemorrhoids without mention of complication   . Measles   . Mumps    x 2  . Personal history of colonic polyps   . Pure hypercholesterolemia   . Regurgitation    Past Surgical History:  Procedure Laterality Date  . basal cell carcinoma of anterior chest  2000   resection; [facility]  . cataract surgery  2010   with lens repacement Left  . COLONOSCOPY  09/23/12   normal.  Symptoms: anemia, hemoccult+.  [facility].  . ESOPHAGOGASTRODUODENOSCOPY  09/23/12   normal.  Symptoms: anemia, hemoccult +, GERD.  Marland Kitchen EYE SURGERY Bilateral    cataract  . FRACTURE SURGERY Left    1957 left arm  . small bowel mass  1991   removed part of small intestine benign mass  . SMALL INTESTINE SURGERY     Allergies  Allergen Reactions  . Zyrtec D [Cetirizine-Pseudoephedrine Er]     "talking out of his head"   Current Outpatient Prescriptions  Medication Sig Dispense Refill  . ALPRAZolam (XANAX) 0.5 MG tablet Take 1 tablet (0.5 mg total) by mouth at bedtime as needed. 90 tablet 1  . aspirin 325 MG EC tablet Take 81 mg by mouth daily.     Marland Kitchen atorvastatin (LIPITOR) 40 MG tablet Take 1 tablet (40 mg total) by mouth daily. 90 tablet 3  . fluticasone (FLONASE) 50 MCG/ACT nasal spray Place 2 sprays into both nostrils daily.  16 g 10  . loratadine (CLARITIN) 10 MG tablet Take 10 mg by mouth daily.    . Multiple Vitamin (MULTIVITAMINS PO) Take by mouth daily.    Marland Kitchen omeprazole (PRILOSEC) 40 MG capsule Take 1 capsule (40 mg total) by mouth daily. 90 capsule 3  . sertraline (ZOLOFT) 50 MG tablet Take 1 tablet (50 mg total) by mouth daily. 90 tablet 3   No current facility-administered medications for this visit.    Social History   Social History  . Marital status: Married    Spouse name: N/A  . Number of children: 1  . Years of education: N/A    Occupational History  . Disability     [employer]  . Previous owner of [employer]    Social History Main Topics  . Smoking status: Former Smoker    Packs/day: 2.00    Years: 25.00    Types: Cigarettes  . Smokeless tobacco: Not on file     Comment: quit in 1984  . Alcohol use No     Comment: 3-5 drinks  . Drug use: No  . Sexual activity: Not on file   Other Topics Concern  . Not on file   Social History Narrative   Always uses seat belts. Smoke alarm and carbon monoxide detector in home.No Guns. Caffeine: Coffee 5 x daily.Married x 37 years Happily. Lives with spouse bought a beach house at [place] in 2011. Also has mountain house in [place]. 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 Exercise: 3 x week walking; 2 miles daily with wife, during winter months in gym-gazelle and stair-master. Pt doing PT 30 - 45 mins per day.   Family History  Problem Relation Age of Onset  . Hyperlipidemia Sister   . Mental illness Sister   . Mental retardation Sister     anxiety  . Hyperlipidemia Brother   . Diabetes Brother   . Arthritis Brother     Knee replacement  . Bipolar disorder Brother   . Diabetes Mother   . Depression Mother   . Esophageal varices Mother   . Hyperlipidemia Mother   . Mental illness Mother   . Cancer Father     unknown       Objective:    BP 110/64 (BP Location: Right Arm, Patient Position: Sitting, Cuff Size: Large)   Pulse 64   Temp 98 F (36.7 C) (Oral)   Resp 17   Ht 5' 10.5" (1.791 m)   Wt 215 lb (97.5 kg)   SpO2 95%   BMI 30.41 kg/m  Physical Exam  Constitutional: He is oriented to person, place, and time. He appears well-developed and well-nourished. No distress.  HENT:  Head: Normocephalic and atraumatic.  Right Ear: External ear normal.  Left Ear: External ear normal.  Nose: Nose normal.  Mouth/Throat: Oropharynx is clear and moist.  Eyes: Conjunctivae and EOM are normal. Pupils are equal, round, and  reactive to light.  Neck: Normal range of motion. Neck supple. Carotid bruit is not present. No thyromegaly present.  Cardiovascular: Normal rate, regular rhythm, normal heart sounds and intact distal pulses.  Exam reveals no gallop and no friction rub.   No murmur heard. Pulmonary/Chest: Effort normal and breath sounds normal. He has no wheezes. He has no rales.  Abdominal: Soft. Bowel sounds are normal. He exhibits no distension and no mass. There is no tenderness. There is no rebound and no guarding.  Lymphadenopathy:    He has  no cervical adenopathy.  Neurological: He is alert and oriented to person, place, and time. No cranial nerve deficit.  Skin: Skin is warm and dry. No rash noted. He is not diaphoretic.  Psychiatric: He has a normal mood and affect. His behavior is normal. Judgment and thought content normal.  Nursing note and vitals reviewed.  Results for orders placed or performed in visit on 08/29/15  CBC with Differential/Platelet  Result Value Ref Range   WBC 8.4 3.8 - 10.8 K/uL   RBC 5.33 4.20 - 5.80 MIL/uL   Hemoglobin 16.2 13.2 - 17.1 g/dL   HCT 47.9 38.5 - 50.0 %   MCV 89.9 80.0 - 100.0 fL   MCH 30.4 27.0 - 33.0 pg   MCHC 33.8 32.0 - 36.0 g/dL   RDW 13.4 11.0 - 15.0 %   Platelets 249 140 - 400 K/uL   MPV 11.9 7.5 - 12.5 fL   Neutro Abs 5,460 1,500 - 7,800 cells/uL   Lymphs Abs 2,184 850 - 3,900 cells/uL   Monocytes Absolute 672 200 - 950 cells/uL   Eosinophils Absolute 84 15 - 500 cells/uL   Basophils Absolute 0 0 - 200 cells/uL   Neutrophils Relative % 65 %   Lymphocytes Relative 26 %   Monocytes Relative 8 %   Eosinophils Relative 1 %   Basophils Relative 0 %   Smear Review Criteria for review not met   Comprehensive metabolic panel  Result Value Ref Range   Sodium 140 135 - 146 mmol/L   Potassium 4.4 3.5 - 5.3 mmol/L   Chloride 103 98 - 110 mmol/L   CO2 24 20 - 31 mmol/L   Glucose, Bld 82 65 - 99 mg/dL   BUN 7 7 - 25 mg/dL   Creat 0.97 0.70 - 1.25  mg/dL   Total Bilirubin 0.8 0.2 - 1.2 mg/dL   Alkaline Phosphatase 85 40 - 115 U/L   AST 21 10 - 35 U/L   ALT 19 9 - 46 U/L   Total Protein 7.4 6.1 - 8.1 g/dL   Albumin 4.4 3.6 - 5.1 g/dL   Calcium 9.4 8.6 - 10.3 mg/dL  PSA  Result Value Ref Range   PSA 1.29 <=4.00 ng/mL  POCT urinalysis dipstick  Result Value Ref Range   Color, UA yellow yellow   Clarity, UA clear clear   Glucose, UA negative negative   Bilirubin, UA negative negative   Ketones, POC UA negative negative   Spec Grav, UA <=1.005    Blood, UA negative negative   pH, UA 6.5    Protein Ur, POC negative negative   Urobilinogen, UA 0.2    Nitrite, UA Negative Negative   Leukocytes, UA Negative Negative  POCT Microscopic Urinalysis ([facility])  Result Value Ref Range   WBC,UR,HPF,POC None None WBC/hpf   RBC,UR,HPF,POC None None RBC/hpf   Bacteria None None, Too numerous to count   Mucus Absent Absent   Epithelial Cells, UR Per Microscopy None None, Too numerous to count cells/hpf   Fall Risk  01/16/2016 08/29/2015 02/13/2015 01/09/2015 08/01/2014  Falls in the past year? No Yes No No No  Number falls in past yr: - 2 or more - - -  Injury with Fall? - Yes - - -   Depression screen [facility] 2/9 01/16/2016 08/29/2015 01/09/2015 08/01/2014 03/09/2014  Decreased Interest 0 0 1 0 0  Down, Depressed, Hopeless 0 0 1 0 0  PHQ - 2 Score 0 0 2 0 0  Altered sleeping - -  3 - -  Tired, decreased energy - - 0 - -  Change in appetite - - 1 - -  Feeling bad or failure about yourself  - - 0 - -  Trouble concentrating - - 3 - -  Moving slowly or fidgety/restless - - 0 - -  Suicidal thoughts - - 0 - -  PHQ-9 Score - - 9 - -       Assessment & Plan:   1. Pure hypercholesterolemia   2. Other abnormal glucose   3. Psychophysiological insomnia   4. Elevated cholesterol   5. Gastroesophageal reflux disease without esophagitis   6. Chronic seasonal allergic rhinitis due to pollen   7. Need for prophylactic vaccination and inoculation  against influenza    -controlled; obtain labs; no changes to medical management. -emotionally doing very well.  -refill of Xanax provided; consider weaning at future visits. -s/p flu vaccine.   Orders Placed This Encounter  Procedures  . Flu Vaccine QUAD 36+ mos IM  . CBC with Differential/Platelet  . Comprehensive metabolic panel    Order Specific Question:   Has the patient fasted?    Answer:   Yes  . Lipid panel    Order Specific Question:   Has the patient fasted?    Answer:   Yes  . Hemoglobin A1c   Meds ordered this encounter  Medications  . ALPRAZolam (XANAX) 0.5 MG tablet    Sig: Take 1 tablet (0.5 mg total) by mouth at bedtime as needed.    Dispense:  90 tablet    Refill:  1    Return in about 6 months (around 07/15/2016) for complete physical examiniation.   Audi Conover Elayne Guerin, M.D. [facility] [address] [place], [place]  [address] [phone] phone [phone] fax

## 2016-01-16 NOTE — Patient Instructions (Signed)
     IF you received an x-ray today, you will receive an invoice from [facility]. Please contact [facility] at [phone] with questions or concerns regarding your invoice.   IF you received labwork today, you will receive an invoice from [facility]. Please contact [facility] at [phone] with questions or concerns regarding your invoice.   Our billing staff will not be able to assist you with questions regarding bills from these companies.  You will be contacted with the lab results as soon as they are available. The fastest way to get your results is to activate your My Chart account. Instructions are located on the last page of this paperwork. If you have not heard from us regarding the results in 2 weeks, please contact this office.      

## 2016-02-14 DIAGNOSIS — D485 Neoplasm of uncertain behavior of skin: Secondary | ICD-10-CM | POA: Diagnosis not present

## 2016-02-14 DIAGNOSIS — L728 Other follicular cysts of the skin and subcutaneous tissue: Secondary | ICD-10-CM | POA: Diagnosis not present

## 2016-02-14 DIAGNOSIS — L570 Actinic keratosis: Secondary | ICD-10-CM | POA: Diagnosis not present

## 2016-02-14 DIAGNOSIS — X32XXXA Exposure to sunlight, initial encounter: Secondary | ICD-10-CM | POA: Diagnosis not present

## 2016-02-14 DIAGNOSIS — C44519 Basal cell carcinoma of skin of other part of trunk: Secondary | ICD-10-CM | POA: Diagnosis not present

## 2016-02-14 DIAGNOSIS — Z85828 Personal history of other malignant neoplasm of skin: Secondary | ICD-10-CM | POA: Diagnosis not present

## 2016-02-14 DIAGNOSIS — Z08 Encounter for follow-up examination after completed treatment for malignant neoplasm: Secondary | ICD-10-CM | POA: Diagnosis not present

## 2016-02-14 DIAGNOSIS — L821 Other seborrheic keratosis: Secondary | ICD-10-CM | POA: Diagnosis not present

## 2016-03-06 ENCOUNTER — Telehealth: Payer: Self-pay

## 2016-03-06 NOTE — Telephone Encounter (Signed)
Pt called requesting handicapped placard d/t his lack of peripheral vision. He stated that he was able to get a limited driver's license since [facility] DMV does not test for peripheral vision. He does have trouble navigating through parking lots when he is by himself. He does not see cars backing up or curbs, etc, in many instances. He would not use this when he is with his wife to help him, but if she is not it is safer for him to park close to building.

## 2016-03-12 NOTE — Telephone Encounter (Signed)
Wife had LM asking for status of this req. I called and advised her that Dr Tamala Julian has not given final decision on this, but that when I spoke to her, she was not sure that pt would meet criteria for placard since his visual loss was just peripheral and not complete legal blindness. Advised we will call to advise once Dr Tamala Julian looks into this further and responds to the request. Wife verbalized understanding.

## 2016-03-14 ENCOUNTER — Encounter: Payer: Self-pay | Admitting: Family Medicine

## 2016-04-15 DIAGNOSIS — C44519 Basal cell carcinoma of skin of other part of trunk: Secondary | ICD-10-CM | POA: Diagnosis not present

## 2016-04-17 ENCOUNTER — Encounter: Payer: Self-pay | Admitting: Family Medicine

## 2016-04-22 ENCOUNTER — Encounter: Payer: Self-pay | Admitting: Family Medicine

## 2016-05-18 NOTE — Telephone Encounter (Signed)
Script shredded Written 08/29/15

## 2016-06-09 ENCOUNTER — Other Ambulatory Visit: Payer: Self-pay | Admitting: Family Medicine

## 2016-06-09 DIAGNOSIS — K219 Gastro-esophageal reflux disease without esophagitis: Secondary | ICD-10-CM

## 2016-07-02 ENCOUNTER — Other Ambulatory Visit: Payer: Self-pay | Admitting: Family Medicine

## 2016-07-11 ENCOUNTER — Encounter: Payer: Self-pay | Admitting: Family Medicine

## 2016-07-16 ENCOUNTER — Ambulatory Visit (INDEPENDENT_AMBULATORY_CARE_PROVIDER_SITE_OTHER): Payer: PPO | Admitting: Family Medicine

## 2016-07-16 ENCOUNTER — Encounter: Payer: Self-pay | Admitting: Family Medicine

## 2016-07-16 VITALS — BP 148/83 | HR 63 | Temp 98.0°F | Resp 18 | Ht 72.5 in | Wt 220.0 lb

## 2016-07-16 DIAGNOSIS — Z8673 Personal history of transient ischemic attack (TIA), and cerebral infarction without residual deficits: Secondary | ICD-10-CM | POA: Diagnosis not present

## 2016-07-16 DIAGNOSIS — E7800 Pure hypercholesterolemia, unspecified: Secondary | ICD-10-CM | POA: Diagnosis not present

## 2016-07-16 DIAGNOSIS — R002 Palpitations: Secondary | ICD-10-CM | POA: Diagnosis not present

## 2016-07-16 DIAGNOSIS — R0789 Other chest pain: Secondary | ICD-10-CM

## 2016-07-16 DIAGNOSIS — Z125 Encounter for screening for malignant neoplasm of prostate: Secondary | ICD-10-CM | POA: Diagnosis not present

## 2016-07-16 DIAGNOSIS — J301 Allergic rhinitis due to pollen: Secondary | ICD-10-CM | POA: Diagnosis not present

## 2016-07-16 DIAGNOSIS — Z23 Encounter for immunization: Secondary | ICD-10-CM

## 2016-07-16 DIAGNOSIS — Z0000 Encounter for general adult medical examination without abnormal findings: Secondary | ICD-10-CM | POA: Diagnosis not present

## 2016-07-16 DIAGNOSIS — G4730 Sleep apnea, unspecified: Secondary | ICD-10-CM

## 2016-07-16 DIAGNOSIS — E7439 Other disorders of intestinal carbohydrate absorption: Secondary | ICD-10-CM

## 2016-07-16 DIAGNOSIS — F4323 Adjustment disorder with mixed anxiety and depressed mood: Secondary | ICD-10-CM | POA: Diagnosis not present

## 2016-07-16 DIAGNOSIS — G4710 Hypersomnia, unspecified: Secondary | ICD-10-CM | POA: Diagnosis not present

## 2016-07-16 DIAGNOSIS — K219 Gastro-esophageal reflux disease without esophagitis: Secondary | ICD-10-CM | POA: Diagnosis not present

## 2016-07-16 DIAGNOSIS — F5104 Psychophysiologic insomnia: Secondary | ICD-10-CM

## 2016-07-16 LAB — POCT URINALYSIS DIP (MANUAL ENTRY)
Bilirubin, UA: NEGATIVE
Glucose, UA: NEGATIVE mg/dL
Ketones, POC UA: NEGATIVE mg/dL
LEUKOCYTES UA: NEGATIVE
NITRITE UA: NEGATIVE
PH UA: 6 (ref 5.0–8.0)
PROTEIN UA: NEGATIVE mg/dL
RBC UA: NEGATIVE
Spec Grav, UA: 1.015 (ref 1.010–1.025)
UROBILINOGEN UA: 0.2 U/dL

## 2016-07-16 NOTE — Progress Notes (Signed)
Subjective:    Patient ID: Tyrone Curtis, male    DOB: 07/07/1950, 66 y.o.   MRN: [id number]  HPI This 66 y.o. male presents for Annual Wellness Examination and follow-up of chronic medical conditions.  Last physical:  08-29-2015 Colonoscopy:  2010; repeat 2014; repeat in 5 years PSA: 2017 Eye exam:  Scheduled in June Dental exam:  Scheduled in July  Immunization History  Administered Date(s) Administered  . Influenza Split 01/02/2007, 02/06/2009, 04/03/2011, 12/16/2011  . Influenza,inj,Quad PF,36+ Mos 01/03/2014, 01/09/2015, 01/16/2016  . Influenza-Unspecified 12/04/2012  . Pneumococcal Conjugate-13 08/01/2014  . Pneumococcal Polysaccharide-23 07/16/2016  . Tdap 04/30/2011  . Zoster 12/29/2014   BP Readings from Last 3 Encounters:  07/16/16 (!) 148/83  01/16/16 110/64  08/29/15 (!) 144/74   Wt Readings from Last 3 Encounters:  07/16/16 220 lb (99.8 kg)  01/16/16 215 lb (97.5 kg)  08/29/15 210 lb (95.3 kg)   Family history of coronary artery disease: worried excessively regarding CAD.  Admits to SOB; usually occurs with tying shoes.  Has chest tightness with bending over only.   Also has noticed tachycardia to 130 after working in the yard and sitting in the house recovering.    R arm pain: very active in the yard.  Tender to touch along R arm lateral; also elbow.  Took Tylenol last night with good relief.   Must wear water shoes due to numbness.    Skin cancer: scheduled with [facility] in June 2018.  Goes to [facility]    Hypercholesterolemia: Patient reports good compliance with medication, good tolerance to medication, and good symptom control.    History of CVA: L sided numbness.  Loss of peripheral vision.  Difficulties ambulating through parking lots alone; requests handicap sticker to avoid walking alone through parking lots.  Anxiety and depression: Patient reports good compliance with medication, good tolerance to medication, and good symptom control.  Anxiety  much less severe and more controlled.  Less worry.   Review of Systems  Constitutional: Positive for diaphoresis. Negative for activity change, appetite change, chills, fatigue, fever and unexpected weight change.  HENT: Positive for hearing loss. Negative for congestion, dental problem, drooling, ear discharge, ear pain, facial swelling, mouth sores, nosebleeds, postnasal drip, rhinorrhea, sinus pressure, sneezing, sore throat, tinnitus, trouble swallowing and voice change.   Eyes: Positive for photophobia. Negative for pain, discharge, redness, itching and visual disturbance.  Respiratory: Positive for chest tightness and shortness of breath. Negative for apnea, cough, choking, wheezing and stridor.   Cardiovascular: Positive for palpitations. Negative for chest pain and leg swelling.  Gastrointestinal: Negative for abdominal pain, blood in stool, constipation, diarrhea, nausea and vomiting.  Endocrine: Negative for cold intolerance, heat intolerance, polydipsia, polyphagia and polyuria.  Genitourinary: Negative for decreased urine volume, difficulty urinating, discharge, dysuria, enuresis, flank pain, frequency, genital sores, hematuria, penile pain, penile swelling, scrotal swelling, testicular pain and urgency.  Musculoskeletal: Positive for myalgias. Negative for arthralgias, back pain, gait problem, joint swelling, neck pain and neck stiffness.  Skin: Negative for color change, pallor, rash and wound.  Allergic/Immunologic: Positive for environmental allergies. Negative for food allergies and immunocompromised state.  Neurological: Positive for numbness. Negative for dizziness, tremors, seizures, syncope, facial asymmetry, speech difficulty, weakness, light-headedness and headaches.  Hematological: Negative for adenopathy. Does not bruise/bleed easily.  Psychiatric/Behavioral: Negative for agitation, behavioral problems, confusion, decreased concentration, dysphoric mood, hallucinations,  self-injury, sleep disturbance and suicidal ideas. The patient is nervous/anxious. The patient is not hyperactive.    Past Medical  History:  Diagnosis Date  . Allergic rhinitis, cause unspecified   . Allergy   . Anxiety state, unspecified   . Arthritis   . Basal cell carcinoma    facial  . Cataract   . Chicken pox   . CVA (cerebral vascular accident) ([place])   . Depression   . 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 Dysphagia, unspecified(787.20)   . Erectile dysfunction   . Herpes zoster without mention of complication   . Internal hemorrhoids without mention of complication   . Measles   . Mumps    x 2  . Personal history of colonic polyps   . Pure hypercholesterolemia   . Regurgitation    Past Surgical History:  Procedure Laterality Date  . basal cell carcinoma of anterior chest  2000   resection; [facility]  . cataract surgery  2010   with lens repacement Left  . COLONOSCOPY  09/23/12   normal.  Symptoms: anemia, hemoccult+.  [facility].  . ESOPHAGOGASTRODUODENOSCOPY  09/23/12   normal.  Symptoms: anemia, hemoccult +, GERD.  [facility] EYE SURGERY Bilateral    cataract  . FRACTURE SURGERY Left    1957 left arm  . small bowel mass  1991   removed part of small intestine benign mass  . SMALL INTESTINE SURGERY       Social History   Social History  . Marital status: Married    Spouse name: N/A  . Number of children: 1  . Years of education: N/A   Occupational History  . Disability     CVA  . Previous owner of [employer]    Social History Main Topics  . Smoking status: Former Smoker    Packs/day: 2.00    Years: 25.00    Types: Cigarettes  . Smokeless tobacco: Never Used     Comment: quit in 1984  . Alcohol use No     Comment: 3-5 drinks  . Drug use: No  . Sexual activity: Yes   Other Topics Concern  . Not on file   Social History Narrative   Marital status:  Married x       Children: 1 daughter; 1 grandson      Lives: with  wife; has mountain house and beach house at [place]      Employment: disabled since CVA      Tobacco: none      Alcohol:  1-2 drinks per night      Drugs:  None      Exercise: very active in the yard.       ADLs: independent with ADLs; drives locally; maintains own yard.        Advanced Directives:          Always uses seat belts. Smoke alarm and carbon monoxide detector in home.No Guns. Caffeine: Coffee 5 x daily.Married x 37 years Happily. Lives with spouse bought a beach house at [place] in 2011. Also has mountain house in [place]. 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 Exercise: 3 x week walking; 2 miles daily with wife, during winter months in gym-gazelle and stair-master. Pt doing PT 30 - 45 mins per day.   Family History  Problem Relation Age of Onset  . Hyperlipidemia Sister   . Mental illness Sister        anxiety  . Liver disease Sister   . Hyperlipidemia Brother   . Diabetes Brother   . Arthritis  Brother        Knee replacement  . Bipolar disorder Brother   . Liver disease Brother   . Heart disease Brother 67       AMI/CAD with stenting  . Diabetes Mother   . Depression Mother   . Esophageal varices Mother   . Hyperlipidemia Mother   . Mental illness Mother   . Liver disease Mother   . Cancer Father        unknown         Objective:   Physical Exam  Constitutional: He is oriented to person, place, and time. He appears well-developed and well-nourished. No distress.  HENT:  Head: Normocephalic and atraumatic.  Right Ear: External ear normal.  Left Ear: External ear normal.  Nose: Nose normal.  Mouth/Throat: Oropharynx is clear and moist.  Eyes: Conjunctivae and EOM are normal. Pupils are equal, round, and reactive to light.  Neck: Normal range of motion. Neck supple. Carotid bruit is not present. No thyromegaly present.  Cardiovascular: Normal rate, regular rhythm, normal heart sounds and intact distal pulses.   Exam reveals no gallop and no friction rub.   No murmur heard. Pulmonary/Chest: Effort normal and breath sounds normal. He has no wheezes. He has no rales.  Abdominal: Soft. Bowel sounds are normal. He exhibits no distension and no mass. There is no tenderness. There is no rebound and no guarding. Hernia confirmed negative in the right inguinal area and confirmed negative in the left inguinal area.  Genitourinary: Rectum normal, testes normal and penis normal. Prostate is enlarged.  Musculoskeletal:       Right shoulder: Normal.       Left shoulder: Normal.       Cervical back: Normal.  Lymphadenopathy:    He has no cervical adenopathy.       Right: No inguinal adenopathy present.       Left: No inguinal adenopathy present.  Neurological: He is alert and oriented to person, place, and time. He has normal reflexes. No cranial nerve deficit. He exhibits normal muscle tone. Coordination normal.  Skin: Skin is warm and dry. No rash noted. He is not diaphoretic.  Psychiatric: He has a normal mood and affect. His behavior is normal. Judgment and thought content normal.   Depression screen [facility] 2/9 07/16/2016 01/16/2016 08/29/2015 01/09/2015 08/01/2014  Decreased Interest 0 0 0 1 0  Down, Depressed, Hopeless 0 0 0 1 0  PHQ - 2 Score 0 0 0 2 0  Altered sleeping - - - 3 -  Tired, decreased energy - - - 0 -  Change in appetite - - - 1 -  Feeling bad or failure about yourself  - - - 0 -  Trouble concentrating - - - 3 -  Moving slowly or fidgety/restless - - - 0 -  Suicidal thoughts - - - 0 -  PHQ-9 Score - - - 9 -   Fall Risk  07/16/2016 01/16/2016 08/29/2015 02/13/2015 01/09/2015  Falls in the past year? No No Yes No No  Number falls in past yr: - - 2 or more - -  Injury with Fall? - - Yes - -   Functional Status Survey: Is the patient deaf or have difficulty hearing?: No Does the patient have difficulty seeing, even when wearing glasses/contacts?: Yes Does the patient have difficulty concentrating,  remembering, or making decisions?: No Does the patient have difficulty walking or climbing stairs?: No Does the patient have difficulty dressing or bathing?: No Does  the patient have difficulty doing errands alone such as visiting a doctor's office or shopping?: Yes     Assessment & Plan:   1. Medicare annual wellness visit, subsequent   2. History of CVA (cerebrovascular accident)   3. Elevated cholesterol   4. Psychophysiological insomnia   5. Gastroesophageal reflux disease without esophagitis   6. Seasonal allergic rhinitis due to pollen   7. Adjustment disorder with mixed anxiety and depressed mood   8. Glucose intolerance   9. Encounter for prostate cancer screening   10. Other chest pain   11. Palpitations   12. Hypersomnia with sleep apnea   13. Need for prophylactic vaccination against Streptococcus pneumoniae (pneumococcus)    -anticipatory guidance provided --- exercise, weight loss, safe driving practices, aspirin 81mg  daily. -obtain age appropriate screening labs and labs for chronic disease management. -s/p Pneumovax 23. -refer to cardiology due to chest tightness, DOE, and tachycardia/palpitations.  To ED for chest pain, persistent tachycardia.   Orders Placed This Encounter  Procedures  . Pneumococcal polysaccharide vaccine 23-valent greater than or equal to 2yo subcutaneous/IM  . CBC with Differential/Platelet  . Comprehensive metabolic panel  . Hemoglobin A1c  . Lipid panel  . TSH  . Microalbumin, urine  . PSA  . Ambulatory referral to Cardiology  . POCT urinalysis dipstick  . EKG 12-Lead   Meds ordered this encounter  Medications  . atorvastatin (LIPITOR) 40 MG tablet    Sig: TAKE 1 TABLET(40 MG) BY MOUTH DAILY    Dispense:  90 tablet    Refill:  3  . omeprazole (PRILOSEC) 40 MG capsule    Sig: TAKE 1 CAPSULE(40 MG) BY MOUTH DAILY    Dispense:  90 capsule    Refill:  3  . sertraline (ZOLOFT) 50 MG tablet    Sig: Take 1 tablet (50 mg total) by  mouth daily.    Dispense:  90 tablet    Refill:  3   Norwood Levo, M.D. Primary Care at [facility] [address] [place], [place]  [address] [phone] phone [phone] fax

## 2016-07-16 NOTE — Patient Instructions (Addendum)
   IF you received an x-ray today, you will receive an invoice from [facility]. Please contact [facility] at [phone] with questions or concerns regarding your invoice.   IF you received labwork today, you will receive an invoice from [facility]. Please contact [facility] at [phone] with questions or concerns regarding your invoice.   Our billing staff will not be able to assist you with questions regarding bills from these companies.  You will be contacted with the lab results as soon as they are available. The fastest way to get your results is to activate your My Chart account. Instructions are located on the last page of this paperwork. If you have not heard from us regarding the results in 2 weeks, please contact this office.      Preventive Care 68 Years and Older, Male Preventive care refers to lifestyle choices and visits with your health care provider that can promote health and wellness. What does preventive care include?  A yearly physical exam. This is also called an annual well check.  Dental exams once or twice a year.  Routine eye exams. Ask your health care provider how often you should have your eyes checked.  Personal lifestyle choices, including: ? Daily care of your teeth and gums. ? Regular physical activity. ? Eating a healthy diet. ? Avoiding tobacco and drug use. ? Limiting alcohol use. ? Practicing safe sex. ? Taking low doses of aspirin every day. ? Taking vitamin and mineral supplements as recommended by your health care provider. What happens during an annual well check? The services and screenings done by your health care provider during your annual well check will depend on your age, overall health, lifestyle risk factors, and family history of disease. Counseling Your health care provider may ask you questions about your:  Alcohol use.  Tobacco use.  Drug use.  Emotional well-being.  Home and relationship  well-being.  Sexual activity.  Eating habits.  History of falls.  Memory and ability to understand (cognition).  Work and work environment.  Screening You may have the following tests or measurements:  Height, weight, and BMI.  Blood pressure.  Lipid and cholesterol levels. These may be checked every 5 years, or more frequently if you are over 50 years old.  Skin check.  Lung cancer screening. You may have this screening every year starting at age 55 if you have a 30-pack-year history of smoking and currently smoke or have quit within the past 15 years.  Fecal occult blood test (FOBT) of the stool. You may have this test every year starting at age 50.  Flexible sigmoidoscopy or colonoscopy. You may have a sigmoidoscopy every 5 years or a colonoscopy every 10 years starting at age 50.  Prostate cancer screening. Recommendations will vary depending on your family history and other risks.  Hepatitis C blood test.  Hepatitis B blood test.  Sexually transmitted disease (STD) testing.  Diabetes screening. This is done by checking your blood sugar (glucose) after you have not eaten for a while (fasting). You may have this done every 1-3 years.  Abdominal aortic aneurysm (AAA) screening. You may need this if you are a current or former smoker.  Osteoporosis. You may be screened starting at age 70 if you are at high risk.  Talk with your health care provider about your test results, treatment options, and if necessary, the need for more tests. Vaccines Your health care provider may recommend certain vaccines, such as:  Influenza vaccine. This   is recommended every year.  Tetanus, diphtheria, and acellular pertussis (Tdap, Td) vaccine. You may need a Td booster every 10 years.  Varicella vaccine. You may need this if you have not been vaccinated.  Zoster vaccine. You may need this after age 60.  Measles, mumps, and rubella (MMR) vaccine. You may need at least one dose of  MMR if you were born in 1957 or later. You may also need a second dose.  Pneumococcal 13-valent conjugate (PCV13) vaccine. One dose is recommended after age 68.  Pneumococcal polysaccharide (PPSV23) vaccine. One dose is recommended after age 68.  Meningococcal vaccine. You may need this if you have certain conditions.  Hepatitis A vaccine. You may need this if you have certain conditions or if you travel or work in places where you may be exposed to hepatitis A.  Hepatitis B vaccine. You may need this if you have certain conditions or if you travel or work in places where you may be exposed to hepatitis B.  Haemophilus influenzae type b (Hib) vaccine. You may need this if you have certain risk factors.  Talk to your health care provider about which screenings and vaccines you need and how often you need them. This information is not intended to replace advice given to you by your health care provider. Make sure you discuss any questions you have with your health care provider. Document Released: 03/24/2015 Document Revised: 11/15/2015 Document Reviewed: 12/27/2014 Elsevier Interactive Patient Education  2017 [employer] Inc[employer]  

## 2016-07-17 LAB — COMPREHENSIVE METABOLIC PANEL
ALBUMIN: 4.6 g/dL (ref 3.6–4.8)
ALK PHOS: 97 IU/L (ref 39–117)
ALT: 27 IU/L (ref 0–44)
AST: 32 IU/L (ref 0–40)
Albumin/Globulin Ratio: 1.7 (ref 1.2–2.2)
BUN / CREAT RATIO: 13 (ref 10–24)
BUN: 13 mg/dL (ref 8–27)
Bilirubin Total: 0.7 mg/dL (ref 0.0–1.2)
CO2: 23 mmol/L (ref 18–29)
CREATININE: 1 mg/dL (ref 0.76–1.27)
Calcium: 9.8 mg/dL (ref 8.6–10.2)
Chloride: 101 mmol/L (ref 96–106)
GFR calc Af Amer: 90 mL/min/{1.73_m2} (ref >59)
GFR calc non Af Amer: 78 mL/min/{1.73_m2} (ref >59)
GLOBULIN, TOTAL: 2.7 g/dL (ref 1.5–4.5)
GLUCOSE: 109 mg/dL — AB (ref 65–99)
Potassium: 4.8 mmol/L (ref 3.5–5.2)
SODIUM: 140 mmol/L (ref 134–144)
TOTAL PROTEIN: 7.3 g/dL (ref 6.0–8.5)

## 2016-07-17 LAB — CBC WITH DIFFERENTIAL/PLATELET
BASOS ABS: 0 10*3/uL (ref 0.0–0.2)
Basos: 0 %
EOS (ABSOLUTE): 0.1 10*3/uL (ref 0.0–0.4)
Eos: 2 %
HEMATOCRIT: 47.2 % (ref 37.5–51.0)
HEMOGLOBIN: 15.6 g/dL (ref 13.0–17.7)
IMMATURE GRANS (ABS): 0 10*3/uL (ref 0.0–0.1)
Immature Granulocytes: 0 %
LYMPHS ABS: 1.8 10*3/uL (ref 0.7–3.1)
LYMPHS: 26 %
MCH: 30.6 pg (ref 26.6–33.0)
MCHC: 33.1 g/dL (ref 31.5–35.7)
MCV: 93 fL (ref 79–97)
MONOCYTES: 10 %
Monocytes Absolute: 0.7 10*3/uL (ref 0.1–0.9)
NEUTROS ABS: 4.3 10*3/uL (ref 1.4–7.0)
Neutrophils: 62 %
Platelets: 243 10*3/uL (ref 150–379)
RBC: 5.1 x10E6/uL (ref 4.14–5.80)
RDW: 13.4 % (ref 12.3–15.4)
WBC: 7 10*3/uL (ref 3.4–10.8)

## 2016-07-17 LAB — MICROALBUMIN, URINE: Microalbumin, Urine: <3 ug/mL

## 2016-07-17 LAB — LIPID PANEL
CHOL/HDL RATIO: 4.3 ratio (ref 0.0–5.0)
Cholesterol, Total: 190 mg/dL (ref 100–199)
HDL: 44 mg/dL (ref >39)
LDL Calculated: 102 mg/dL — ABNORMAL HIGH (ref 0–99)
TRIGLYCERIDES: 219 mg/dL — AB (ref 0–149)
VLDL CHOLESTEROL CAL: 44 mg/dL — AB (ref 5–40)

## 2016-07-17 LAB — HEMOGLOBIN A1C
Est. average glucose Bld gHb Est-mCnc: 126 mg/dL
Hgb A1c MFr Bld: 6 % — ABNORMAL HIGH (ref 4.8–5.6)

## 2016-07-17 LAB — TSH: TSH: 5.05 u[IU]/mL — AB (ref 0.450–4.500)

## 2016-07-17 LAB — PSA: Prostate Specific Ag, Serum: 1 ng/mL (ref 0.0–4.0)

## 2016-08-05 MED ORDER — SERTRALINE HCL 50 MG PO TABS: 50.0000 mg | ORAL_TABLET | Freq: Every day | ORAL | 3 refills | Status: DC

## 2016-08-05 MED ORDER — ATORVASTATIN CALCIUM 40 MG PO TABS: ORAL_TABLET | ORAL | 3 refills | Status: DC

## 2016-08-05 MED ORDER — OMEPRAZOLE 40 MG PO CPDR: DELAYED_RELEASE_CAPSULE | ORAL | 3 refills | Status: DC

## 2016-08-19 ENCOUNTER — Other Ambulatory Visit: Payer: Self-pay | Admitting: Family Medicine

## 2016-08-19 NOTE — Telephone Encounter (Signed)
Please call refill of Xanax to [facility] as approved.

## 2016-08-20 DIAGNOSIS — Z9849 Cataract extraction status, unspecified eye: Secondary | ICD-10-CM | POA: Diagnosis not present

## 2016-08-20 DIAGNOSIS — H5201 Hypermetropia, right eye: Secondary | ICD-10-CM | POA: Diagnosis not present

## 2016-08-20 DIAGNOSIS — H53461 Homonymous bilateral field defects, right side: Secondary | ICD-10-CM | POA: Diagnosis not present

## 2016-08-20 DIAGNOSIS — H52223 Regular astigmatism, bilateral: Secondary | ICD-10-CM | POA: Diagnosis not present

## 2016-08-20 DIAGNOSIS — H524 Presbyopia: Secondary | ICD-10-CM | POA: Diagnosis not present

## 2016-08-20 DIAGNOSIS — Z961 Presence of intraocular lens: Secondary | ICD-10-CM | POA: Diagnosis not present

## 2016-08-20 NOTE — Telephone Encounter (Signed)
Script faxed to pharmacy

## 2016-08-22 DIAGNOSIS — X32XXXA Exposure to sunlight, initial encounter: Secondary | ICD-10-CM | POA: Diagnosis not present

## 2016-08-22 DIAGNOSIS — L570 Actinic keratosis: Secondary | ICD-10-CM | POA: Diagnosis not present

## 2016-08-22 DIAGNOSIS — L821 Other seborrheic keratosis: Secondary | ICD-10-CM | POA: Diagnosis not present

## 2016-08-22 DIAGNOSIS — Z08 Encounter for follow-up examination after completed treatment for malignant neoplasm: Secondary | ICD-10-CM | POA: Diagnosis not present

## 2016-08-22 DIAGNOSIS — D485 Neoplasm of uncertain behavior of skin: Secondary | ICD-10-CM | POA: Diagnosis not present

## 2016-08-22 DIAGNOSIS — D0462 Carcinoma in situ of skin of left upper limb, including shoulder: Secondary | ICD-10-CM | POA: Diagnosis not present

## 2016-08-22 DIAGNOSIS — Z85828 Personal history of other malignant neoplasm of skin: Secondary | ICD-10-CM | POA: Diagnosis not present

## 2016-08-22 NOTE — Progress Notes (Signed)
Cardiology Office Note  Date:  08/23/2016   ID:  Tyrone Curtis, DOB March 25, 1950, MRN [id number]  PCP:  Wardell Honour, MD   Chief Complaint  Patient presents with  . other    New patient. Patient c/o chest pressure, s.o.b. & Tachycardia. Meds reviewed verbally with patient.     HPI:  Tyrone Curtis is a 66 year old gentleman with history of Hyperlipidemia HTN Smoker 2ppd, 25 yrs Stroke December 2012, Episodic tachycardia,  Who presents by referral from Dr. Tamala Julian for consultation of his shortness of breath, chest tightness.  He reports having 2 episodes of tachycardia in the past 3 months First episode he was working in his garden for several hours, was dehydrated, had acute onset tachycardia. Stopped was he was doing, sat down, recorded pulse of 140 bpm Symptoms resolved after several minutes He felt the rhythm was not normal, came on acutely  Reported having second episode of tachycardia when he was dragging limbs approximately 3 weeks ago. Again acute onset, sat down and after several minutes tachycardia went away. Both episodes presenting after explaining exertion  Denies having chest pain on exertion, does get some shortness of breath which is mild, no escalation in symptoms. He reports he is relatively active at baseline without any significant problem  Some shortness of breath when he bends over and then stands up  Long discussion concerning previous History of CVA:02/26/2011 Seen at that time by Dr. Manuella Ghazi, neurology By his report he had a today Holter monitor to rule out arrhythmia Carotid ultrasound He developed L sided numbness.  Loss of peripheral vision.  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  PSH:    Past Surgical History:  Procedure Laterality Date  . basal cell carcinoma of anterior chest  2000   resection; [facility]  . cataract surgery  2010   with lens repacement Left  . COLONOSCOPY  09/23/12   normal.  Symptoms: anemia, hemoccult+.  [facility].  . ESOPHAGOGASTRODUODENOSCOPY  09/23/12   normal.  Symptoms: anemia, hemoccult +, GERD.  Marland Kitchen EYE SURGERY Bilateral    cataract  . FRACTURE SURGERY Left    1957 left arm  . small bowel mass  1991   removed part of small intestine benign mass  . SMALL INTESTINE SURGERY      Current Outpatient Prescriptions  Medication Sig Dispense Refill  . ALPRAZolam (XANAX) 0.5 MG tablet TAKE 1 TABLET BY MOUTH EVERY NIGHT AT BEDTIME AS NEEDED 90 tablet 0  . ASPIRIN 81 PO Take 81 mg by mouth 2 (two) times daily.    Marland Kitchen atorvastatin (LIPITOR) 40 MG tablet TAKE 1 TABLET(40 MG) BY MOUTH DAILY 90 tablet 3  . loratadine (CLARITIN) 10 MG tablet Take 10 mg by mouth daily.    Marland Kitchen  Multiple Vitamin (MULTIVITAMINS PO) Take by mouth daily.    Marland Kitchen omeprazole (PRILOSEC) 40 MG capsule TAKE 1 CAPSULE(40 MG) BY MOUTH DAILY 90 capsule 3  . sertraline (ZOLOFT) 50 MG tablet Take 1 tablet (50 mg total) by mouth daily. 90 tablet 3   No current facility-administered medications for this visit.      Allergies:   Zyrtec d [cetirizine-pseudoephedrine er]   Social History:  The patient   reports that he has quit smoking. His smoking use included Cigarettes. He has a 50.00 pack-year smoking history. He has never used smokeless tobacco. He reports that he does not drink alcohol or use drugs.   Family History:   family history includes Arthritis in his brother; Bipolar disorder in his brother; Cancer in his father; Depression in his mother; Diabetes in his brother and mother; Esophageal varices in his mother; Heart disease (age of onset: 3) in his brother; Hyperlipidemia in his brother, mother, and sister; Liver disease in his brother, mother, and sister; Mental illness in his mother and sister.    Review of Systems: Review of Systems  Constitutional: Negative.   Respiratory: Negative.   Cardiovascular: Negative.   Gastrointestinal: Negative.   Musculoskeletal: Negative.   Neurological: Negative.   Psychiatric/Behavioral: Negative.   All other systems reviewed and are negative.    PHYSICAL EXAM: VS:  BP 138/80 (BP Location: Right Arm, Patient Position: Sitting, Cuff Size: Normal)   Pulse 61   Ht 6' (1.829 m)   Wt 224 lb 4 oz (101.7 kg)   BMI 30.41 kg/m  , BMI Body mass index is 30.41 kg/m. GEN: Well nourished, well developed, in no acute distress  HEENT: normal  Neck: no JVD, carotid bruits, or masses Cardiac: RRR; no murmurs, rubs, or gallops,no edema  Respiratory:  clear to auscultation bilaterally, normal work of breathing GI: soft, nontender, nondistended, + BS MS: no deformity or atrophy  Skin: warm and dry, no rash Neuro:  Strength and sensation are intact Psych: euthymic mood, full affect    Recent Labs: 07/16/2016: ALT 27; BUN 13; Creatinine, Ser 1.00; Hemoglobin 15.6; Platelets 243; Potassium 4.8; Sodium 140; TSH 5.050    Lipid Panel Lab Results  Component Value Date   CHOL 190 07/16/2016   HDL 44 07/16/2016   LDLCALC 102 (H) 07/16/2016   TRIG 219 (H) 07/16/2016      Wt Readings from Last 3 Encounters:  08/23/16 224 lb 4 oz (101.7 kg)   07/16/16 220 lb (99.8 kg)  01/16/16 215 lb (97.5 kg)       ASSESSMENT AND PLAN:  Paroxysmal tachycardia ([place]) Etiology unclear, possibly SVT though unable to exclude atrial tachycardia Discussed other rhythms with him such as atrial fibrillation and flutter He is declined event monitor (30 days) Suggested he monitor rhythm using a pulse meter on his phone and using his blood pressure cuff. Suggested he call our office if he has more frequent episodes of tachycardia  Shortness of breath Shortness of breath bending over likely positional in nature, less likely secondary to underlying ischemia. He is active at baseline, mild symptoms with exertion.  Long discussion concerning various screening tests available for underlying ischemia. He denies having chest pain on exertion. Suggested he consider having CT coronary calcium scoring for risk stratification. We did discuss stress testing. He has declined at this time. We have recommended he call us if symptoms get worse Order placed for CT coronary calcium scoring, he will call radiology to schedule. He would like to think  about it  Pure hypercholesterolemia - Plan: EKG 12-Lead, CT CARDIAC SCORING Explained to him that if he does have underlying coronary disease, goal LDL should be less than 70. CT coronary calcium scoring would help with risk stratification  History of CVA (cerebrovascular accident) - Plan: EKG 12-Lead, CT CARDIAC SCORING Long discussion concerning previous stroke, etiology unclear. At the time no significant carotid disease. Bony had today Holter at the time by his report showing no significant arrhythmia. Recently with episodes of tachycardia.   Smoking history 25 year smoking history in the past Explained this is a risk factor for underlying coronary disease   Total encounter time more than 45 minutes  Greater than 50% was spent in counseling and coordination of care with the patient  Disposition:   F/U  6  months   Orders Placed This Encounter  Procedures  . CT CARDIAC SCORING  . EKG 12-Lead     Signed, Esmond Plants, M.D., Ph.D. 08/23/2016  [place], [place]

## 2016-08-23 ENCOUNTER — Encounter: Payer: Self-pay | Admitting: Cardiovascular Disease

## 2016-08-23 ENCOUNTER — Ambulatory Visit (INDEPENDENT_AMBULATORY_CARE_PROVIDER_SITE_OTHER): Payer: PPO | Admitting: Cardiovascular Disease

## 2016-08-23 VITALS — BP 138/80 | HR 61 | Ht 72.0 in | Wt 224.2 lb

## 2016-08-23 DIAGNOSIS — E7800 Pure hypercholesterolemia, unspecified: Secondary | ICD-10-CM | POA: Diagnosis not present

## 2016-08-23 DIAGNOSIS — I479 Paroxysmal tachycardia, unspecified: Secondary | ICD-10-CM

## 2016-08-23 DIAGNOSIS — Z8673 Personal history of transient ischemic attack (TIA), and cerebral infarction without residual deficits: Secondary | ICD-10-CM | POA: Diagnosis not present

## 2016-08-23 DIAGNOSIS — Z87891 Personal history of nicotine dependence: Secondary | ICD-10-CM | POA: Diagnosis not present

## 2016-08-23 DIAGNOSIS — R0602 Shortness of breath: Secondary | ICD-10-CM | POA: Diagnosis not present

## 2016-08-23 NOTE — Patient Instructions (Addendum)
Monitor your heart rhythm, Call for arrhythmia  Medication Instructions:   No medication changes made  Labwork:  No new labs needed  Testing/Procedures:  We will place an order for CT coronary calcium score  Please call to schedule [phone] There is a $150 fee due at the time of your procedure [address], [address], [place]: It was a pleasure seeing you in the office today. Please call us if you have new issues that need to be addressed before your next appt.  [phone]  Your physician wants you to follow-up in:  As needed  If you need a refill on your cardiac medications before your next appointment, please call your pharmacy.     Coronary Calcium Scan A coronary calcium scan is an imaging test used to look for deposits of calcium and other fatty materials (plaques) in the inner lining of the blood vessels of the heart (coronary arteries). These deposits of calcium and plaques can partly clog and narrow the coronary arteries without producing any symptoms or warning signs. This puts a person at risk for a heart attack. This test can detect these deposits before symptoms develop. Tell a health care provider about:  Any allergies you have.  All medicines you are taking, including vitamins, herbs, eye drops, creams, and over-the-counter medicines.  Any problems you or family members have had with anesthetic medicines.  Any blood disorders you have.  Any surgeries you have had.  Any medical conditions you have.  Whether you are pregnant or may be pregnant. What are the risks? Generally, this is a safe procedure. However, problems may occur, including:  Harm to a pregnant woman and her unborn baby. This test involves the use of radiation. Radiation exposure can be dangerous to a pregnant woman and her unborn baby. If you are pregnant, you generally should not have this procedure done.  Slight increase in the risk of cancer. This is because  of the radiation involved in the test.  What happens before the procedure? No preparation is needed for this procedure. What happens during the procedure?  You will undress and remove any jewelry around your neck or chest.  You will put on a hospital gown.  Sticky electrodes will be placed on your chest. The electrodes will be connected to an electrocardiogram (ECG) machine to record a tracing of the electrical activity of your heart.  A CT scanner will take pictures of your heart. During this time, you will be asked to lie still and hold your breath for 2-3 seconds while a picture of your heart is being taken. The procedure may vary among health care providers and hospitals. What happens after the procedure?  You can get dressed.  You can return to your normal activities.  It is up to you to get the results of your test. Ask your health care provider, or the department that is doing the test, when your results will be ready. Summary  A coronary calcium scan is an imaging test used to look for deposits of calcium and other fatty materials (plaques) in the inner lining of the blood vessels of the heart (coronary arteries).  Generally, this is a safe procedure. Tell your health care provider if you are pregnant or may be pregnant.  No preparation is needed for this procedure.  A CT scanner will take pictures of your heart.  You can return to your normal activities after the scan is done. This information is not intended to replace  advice given to you by your health care provider. Make sure you discuss any questions you have with your health care provider. Document Released: 08/24/2007 Document Revised: 01/15/2016 Document Reviewed: 01/15/2016 Elsevier Interactive Patient Education  2017 [employer].

## 2016-08-26 ENCOUNTER — Ambulatory Visit (INDEPENDENT_AMBULATORY_CARE_PROVIDER_SITE_OTHER)
Admission: RE | Admit: 2016-08-26 | Discharge: 2016-08-26 | Disposition: A | Discharge status: Home / Self Care | Payer: Self-pay | Source: Ambulatory Visit | Attending: Cardiovascular Disease | Admitting: Cardiovascular Disease

## 2016-08-26 DIAGNOSIS — Z8673 Personal history of transient ischemic attack (TIA), and cerebral infarction without residual deficits: Secondary | ICD-10-CM

## 2016-08-26 DIAGNOSIS — E7800 Pure hypercholesterolemia, unspecified: Secondary | ICD-10-CM

## 2016-08-27 ENCOUNTER — Encounter: Payer: Self-pay | Admitting: Family Medicine

## 2016-08-27 ENCOUNTER — Other Ambulatory Visit: Payer: PPO

## 2016-10-08 DIAGNOSIS — D0462 Carcinoma in situ of skin of left upper limb, including shoulder: Secondary | ICD-10-CM | POA: Diagnosis not present

## 2016-11-16 ENCOUNTER — Other Ambulatory Visit: Payer: Self-pay | Admitting: Family Medicine

## 2016-11-18 NOTE — Telephone Encounter (Signed)
Please call in or fax refill of Xanax as approved.

## 2016-12-29 ENCOUNTER — Encounter: Payer: Self-pay | Admitting: Family Medicine

## 2017-01-20 ENCOUNTER — Encounter: Payer: Self-pay | Admitting: Family Medicine

## 2017-01-20 ENCOUNTER — Other Ambulatory Visit: Payer: Self-pay

## 2017-01-20 ENCOUNTER — Ambulatory Visit (INDEPENDENT_AMBULATORY_CARE_PROVIDER_SITE_OTHER): Payer: PPO | Admitting: Family Medicine

## 2017-01-20 VITALS — BP 130/82 | HR 72 | Temp 97.8°F | Resp 16 | Ht 70.87 in | Wt 214.0 lb

## 2017-01-20 DIAGNOSIS — F5104 Psychophysiologic insomnia: Secondary | ICD-10-CM | POA: Diagnosis not present

## 2017-01-20 DIAGNOSIS — K219 Gastro-esophageal reflux disease without esophagitis: Secondary | ICD-10-CM

## 2017-01-20 DIAGNOSIS — J301 Allergic rhinitis due to pollen: Secondary | ICD-10-CM

## 2017-01-20 DIAGNOSIS — D485 Neoplasm of uncertain behavior of skin: Secondary | ICD-10-CM | POA: Diagnosis not present

## 2017-01-20 DIAGNOSIS — X32XXXA Exposure to sunlight, initial encounter: Secondary | ICD-10-CM | POA: Diagnosis not present

## 2017-01-20 DIAGNOSIS — E7800 Pure hypercholesterolemia, unspecified: Secondary | ICD-10-CM

## 2017-01-20 DIAGNOSIS — F4323 Adjustment disorder with mixed anxiety and depressed mood: Secondary | ICD-10-CM | POA: Diagnosis not present

## 2017-01-20 DIAGNOSIS — Z85828 Personal history of other malignant neoplasm of skin: Secondary | ICD-10-CM | POA: Diagnosis not present

## 2017-01-20 DIAGNOSIS — L821 Other seborrheic keratosis: Secondary | ICD-10-CM | POA: Diagnosis not present

## 2017-01-20 DIAGNOSIS — B079 Viral wart, unspecified: Secondary | ICD-10-CM | POA: Diagnosis not present

## 2017-01-20 DIAGNOSIS — Z6829 Body mass index (BMI) 29.0-29.9, adult: Secondary | ICD-10-CM

## 2017-01-20 DIAGNOSIS — Z8673 Personal history of transient ischemic attack (TIA), and cerebral infarction without residual deficits: Secondary | ICD-10-CM | POA: Diagnosis not present

## 2017-01-20 DIAGNOSIS — Z23 Encounter for immunization: Secondary | ICD-10-CM | POA: Diagnosis not present

## 2017-01-20 DIAGNOSIS — I479 Paroxysmal tachycardia, unspecified: Secondary | ICD-10-CM | POA: Diagnosis not present

## 2017-01-20 DIAGNOSIS — R7989 Other specified abnormal findings of blood chemistry: Secondary | ICD-10-CM

## 2017-01-20 DIAGNOSIS — Z08 Encounter for follow-up examination after completed treatment for malignant neoplasm: Secondary | ICD-10-CM | POA: Diagnosis not present

## 2017-01-20 DIAGNOSIS — N5203 Combined arterial insufficiency and corporo-venous occlusive erectile dysfunction: Secondary | ICD-10-CM

## 2017-01-20 DIAGNOSIS — R7302 Impaired glucose tolerance (oral): Secondary | ICD-10-CM | POA: Diagnosis not present

## 2017-01-20 DIAGNOSIS — L570 Actinic keratosis: Secondary | ICD-10-CM | POA: Diagnosis not present

## 2017-01-20 MED ORDER — ZOSTER VAC RECOMB ADJUVANTED 50 MCG/0.5ML IM SUSR: 0.5000 mL | Freq: Once | INTRAMUSCULAR | 1 refills | Status: AC

## 2017-01-20 MED ORDER — ALPRAZOLAM 0.5 MG PO TABS: 0.5000 mg | ORAL_TABLET | Freq: Every evening | ORAL | 0 refills | Status: DC | PRN

## 2017-01-20 NOTE — Progress Notes (Signed)
Subjective:    Patient ID: Tyrone Curtis, male    DOB: October 05, 1950, 66 y.o.   MRN: [id number]  01/20/2017  Depression (6 month follow-up); Anxiety; and Hyperlipidemia    HPI This 66 y.o. male presents for six month follow-up hypercholesterolemia, glucose intolerance, anxiety/depression, paroxysmal tachycardia.  Referred to cardiology at last visit due to two episodes of tachycardia.  Evaluated by DR. Gollan; underwent CT cardiac (WNL) and Holter monitor.   TSH slighlty abnormal at last visit.   Realize that cannot mow three yards at one time.  Walking one mile daily in  Neighborhood.  Wife walks on the track.   Emotionally the best; decreased libido.  Marriage is good.  Going to dermatologist today.  [employer]; cabin in [place].   BP Readings from Last 3 Encounters:  01/20/17 130/82  08/23/16 138/80  07/16/16 (!) 148/83   Wt Readings from Last 3 Encounters:  01/20/17 214 lb (97.1 kg)  08/23/16 224 lb 4 oz (101.7 kg)  07/16/16 220 lb (99.8 kg)   Immunization History  Administered Date(s) Administered  . Influenza Split 01/02/2007, 02/06/2009, 04/03/2011, 12/16/2011  . Influenza,inj,Quad PF,6+ Mos 01/03/2014, 01/09/2015, 01/16/2016, 01/20/2017  . Influenza-Unspecified 12/04/2012  . Pneumococcal Conjugate-13 08/01/2014  . Pneumococcal Polysaccharide-23 07/16/2016  . Tdap 04/30/2011  . Zoster 12/29/2014    Review of Systems  Constitutional: Negative for activity change, appetite change, chills, diaphoresis, fatigue and fever.  Respiratory: Negative for cough and shortness of breath.   Cardiovascular: Negative for chest pain, palpitations and leg swelling.  Gastrointestinal: Negative for abdominal pain, diarrhea, nausea and vomiting.  Endocrine: Negative for cold intolerance, heat intolerance, polydipsia, polyphagia and polyuria.  Skin: Negative for color change, rash and wound.  Neurological: Negative for dizziness, tremors, seizures, syncope, facial  asymmetry, speech difficulty, weakness, light-headedness, numbness and headaches.  Psychiatric/Behavioral: Negative for dysphoric mood and sleep disturbance. The patient is not nervous/anxious.     Past Medical History:  Diagnosis Date  . Allergic rhinitis, cause unspecified   . Allergy   . Anxiety state, unspecified   . Arthritis   . Basal cell carcinoma    facial  . Cataract   . Chicken pox   . CVA (cerebral vascular accident) ([place])   . Depression   . 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 Dysphagia, unspecified(787.20)   . Erectile dysfunction   . Herpes zoster without mention of complication   . Internal hemorrhoids without mention of complication   . Measles   . Mumps    x 2  . Personal history of colonic polyps   . Pure hypercholesterolemia   . Regurgitation    Past Surgical History:  Procedure Laterality Date  . basal cell carcinoma of anterior chest  2000   resection; [facility] Iles  . cataract surgery  2010   with lens repacement Left  . COLONOSCOPY  09/23/12   normal.  Symptoms: anemia, hemoccult+.  [facility].  . ESOPHAGOGASTRODUODENOSCOPY  09/23/12   normal.  Symptoms: anemia, hemoccult +, GERD.  Marland Kitchen EYE SURGERY Bilateral    cataract  . FRACTURE SURGERY Left    1957 left arm  . small bowel mass  1991   removed part of small intestine benign mass  . SMALL INTESTINE SURGERY     Allergies  Allergen Reactions  . Zyrtec D [Cetirizine-Pseudoephedrine Er]     "talking out of his head"   Current Outpatient Medications on File Prior to Visit  Medication Sig Dispense Refill  .  ASPIRIN 81 PO Take 81 mg by mouth 2 (two) times daily.    Marland Kitchen atorvastatin (LIPITOR) 40 MG tablet TAKE 1 TABLET(40 MG) BY MOUTH DAILY 90 tablet 3  . loratadine (CLARITIN) 10 MG tablet Take 10 mg by mouth daily.    . Multiple Vitamin (MULTIVITAMINS PO) Take by mouth daily.    Marland Kitchen omeprazole (PRILOSEC) 40 MG capsule TAKE 1 CAPSULE(40 MG) BY MOUTH DAILY 90 capsule 3    . sertraline (ZOLOFT) 50 MG tablet Take 1 tablet (50 mg total) by mouth daily. 90 tablet 3   No current facility-administered medications on file prior to visit.    Social History   Socioeconomic History  . Marital status: Married    Spouse name: Not on file  . Number of children: 1  . Years of education: Not on file  . Highest education level: Not on file  Social Needs  . Financial resource strain: Not on file  . Food insecurity - worry: Not on file  . Food insecurity - inability: Not on file  . Transportation needs - medical: Not on file  . Transportation needs - non-medical: Not on file  Occupational History  . Occupation: Disability    Comment: CVA  . Occupation: Previous Financial controller of [employer]  Tobacco Use  . Smoking status: Former Smoker    Packs/day: 2.00    Years: 25.00    Pack years: 50.00    Types: Cigarettes  . Smokeless tobacco: Never Used  . Tobacco comment: quit in 1984  Substance and Sexual Activity  . Alcohol use: No    Comment: 3-5 drinks  . Drug use: No  . Sexual activity: Yes  Other Topics Concern  . Not on file  Social History Narrative   Marital status:  Married x       Children: 1 daughter; 1 grandson      Lives: with wife; has mountain house and beach house at [place]      Employment: disabled since CVA      Tobacco: none      Alcohol:  1-2 drinks per night      Drugs:  None      Exercise: very active in the yard.       ADLs: independent with ADLs; drives locally; maintains own yard.        Advanced Directives:          Always uses seat belts. Smoke alarm and carbon monoxide detector in home.No Guns. Caffeine: Coffee 5 x daily.Married x 37 years Happily. Lives with spouse bought a beach house at [place] in 2011. Also has mountain house in [place]. 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 Exercise: 3 x week walking; 2 miles daily with wife, during winter months in gym-gazelle and stair-master. Pt doing PT 30  - 45 mins per day.   Family History  Problem Relation Age of Onset  . Hyperlipidemia Sister   . Mental illness Sister        anxiety  . Liver disease Sister   . Hyperlipidemia Brother   . Diabetes Brother   . Arthritis Brother        Knee replacement  . Bipolar disorder Brother   . Liver disease Brother   . Heart disease Brother 9       AMI/CAD with stenting  . Diabetes Mother   . Depression Mother   . Esophageal varices Mother   . Hyperlipidemia Mother   . Mental  illness Mother   . Liver disease Mother   . Cancer Father        unknown       Objective:    BP 130/82   Pulse 72   Temp 97.8 F (36.6 C) (Oral)   Resp 16   Ht 5' 10.87" (1.8 m)   Wt 214 lb (97.1 kg)   SpO2 95%   BMI 29.96 kg/m  Physical Exam  Constitutional: He is oriented to person, place, and time. He appears well-developed and well-nourished. No distress.  HENT:  Head: Normocephalic and atraumatic.  Right Ear: External ear normal.  Left Ear: External ear normal.  Nose: Nose normal.  Mouth/Throat: Oropharynx is clear and moist.  Eyes: Conjunctivae and EOM are normal. Pupils are equal, round, and reactive to light.  Neck: Normal range of motion. Neck supple. Carotid bruit is not present. No thyromegaly present.  Cardiovascular: Normal rate, regular rhythm, normal heart sounds and intact distal pulses. Exam reveals no gallop and no friction rub.  No murmur heard. Pulmonary/Chest: Effort normal and breath sounds normal. He has no wheezes. He has no rales.  Abdominal: Soft. Bowel sounds are normal. He exhibits no distension and no mass. There is no tenderness. There is no rebound and no guarding.  Lymphadenopathy:    He has no cervical adenopathy.  Neurological: He is alert and oriented to person, place, and time. No cranial nerve deficit.  Skin: Skin is warm and dry. No rash noted. He is not diaphoretic.  Psychiatric: He has a normal mood and affect. His behavior is normal.  Nursing note and vitals  reviewed.  No results found. Depression screen [facility] 2/9 01/20/2017 07/16/2016 01/16/2016 08/29/2015 01/09/2015  Decreased Interest 0 0 0 0 1  Down, Depressed, Hopeless 0 0 0 0 1  PHQ - 2 Score 0 0 0 0 2  Altered sleeping - - - - 3  Tired, decreased energy - - - - 0  Change in appetite - - - - 1  Feeling bad or failure about yourself  - - - - 0  Trouble concentrating - - - - 3  Moving slowly or fidgety/restless - - - - 0  Suicidal thoughts - - - - 0  PHQ-9 Score - - - - 9   Fall Risk  01/20/2017 07/16/2016 01/16/2016 08/29/2015 02/13/2015  Falls in the past year? Yes No No Yes No  Comment - - - - -  Number falls in past yr: 1 - - 2 or more -  Injury with Fall? No - - Yes -  Comment - - - hematoma on back -        Assessment & Plan:   1. Glucose intolerance (impaired glucose tolerance)   2. Adjustment disorder with mixed anxiety and depressed mood   3. Gastroesophageal reflux disease without esophagitis   4. Paroxysmal tachycardia ([place])   5. Seasonal allergic rhinitis due to pollen   6. Combined arterial insufficiency and corporo-venous occlusive erectile dysfunction   7. Pure hypercholesterolemia   8. Need for prophylactic vaccination and inoculation against influenza   9. Abnormal TSH   10. History of CVA (cerebrovascular accident)   95. Psychophysiological insomnia   12. BMI 29.0-29.9,adult     Tachycardia associated with shortness of breath has resolved since last visit.  Status post cardiology consultation with CT cardiac scoring that was negative.  Patient declined event monitor for 30 days.  No recurrence has occurred.  Anxiety and depression well controlled on current  regimen.  Doing very well emotionally.  Recommend attempting to wean benzodiazepine/Xanax if possible in the future.  Cholesterol moderately controlled on current regimen.  Obtain labs for chronic disease management.  Goal LDL of less than 70 due to history of CVA.  History of CAD CVA and doing well with  minimal left-sided weakness and paresthesias on the affected side.  Wants aggressive control of cholesterol, glucose, and blood pressure.  Patient discontinued Plavix therapy but is maintained on aspirin therapy.  TSH slightly abnormal last visit.  Will repeat today.  recommend weight loss, exercise for 30-60 minutes five days per week; recommend 1200 kcal restriction per day with a minimum of 60 grams of protein per day.    Orders Placed This Encounter  Procedures  . Flu Vaccine QUAD 36+ mos IM  . CBC with Differential/Platelet  . Comprehensive metabolic panel    Order Specific Question:   Has the patient fasted?    Answer:   No  . Lipid panel    Order Specific Question:   Has the patient fasted?    Answer:   No  . Hemoglobin A1c  . TSH   Meds ordered this encounter  Medications  . Zoster Vaccine Adjuvanted [facility]) injection    Sig: Inject 0.5 mLs once for 1 dose into the muscle.    Dispense:  0.5 mL    Refill:  1  . ALPRAZolam (XANAX) 0.5 MG tablet    Sig: Take 1 tablet (0.5 mg total) at bedtime as needed by mouth.    Dispense:  90 tablet    Refill:  0    Return in about 6 months (around 07/20/2017) for complete physical examiniation.   Chayah Mckee Elayne Guerin, M.D. Primary Care at [facility] previously [facility] [place] [address] [place], [place]  [address] [phone] phone [phone] fax

## 2017-01-20 NOTE — Patient Instructions (Signed)
     IF you received an x-ray today, you will receive an invoice from [facility]. Please contact [facility] at [phone] with questions or concerns regarding your invoice.   IF you received labwork today, you will receive an invoice from [facility]. Please contact [facility] at [phone] with questions or concerns regarding your invoice.   Our billing staff will not be able to assist you with questions regarding bills from these companies.  You will be contacted with the lab results as soon as they are available. The fastest way to get your results is to activate your My Chart account. Instructions are located on the last page of this paperwork. If you have not heard from us regarding the results in 2 weeks, please contact this office.     

## 2017-01-21 ENCOUNTER — Ambulatory Visit: Payer: PPO | Admitting: Family Medicine

## 2017-01-21 LAB — COMPREHENSIVE METABOLIC PANEL
A/G RATIO: 1.6 (ref 1.2–2.2)
ALT: 40 IU/L (ref 0–44)
AST: 30 IU/L (ref 0–40)
Albumin: 4.7 g/dL (ref 3.6–4.8)
Alkaline Phosphatase: 115 IU/L (ref 39–117)
BUN/Creatinine Ratio: 11 (ref 10–24)
BUN: 12 mg/dL (ref 8–27)
Bilirubin Total: 0.7 mg/dL (ref 0.0–1.2)
CALCIUM: 10 mg/dL (ref 8.6–10.2)
CO2: 24 mmol/L (ref 20–29)
Chloride: 102 mmol/L (ref 96–106)
Creatinine, Ser: 1.1 mg/dL (ref 0.76–1.27)
GFR calc non Af Amer: 69 mL/min/{1.73_m2} (ref >59)
GFR, EST AFRICAN AMERICAN: 79 mL/min/{1.73_m2} (ref >59)
GLOBULIN, TOTAL: 2.9 g/dL (ref 1.5–4.5)
Glucose: 99 mg/dL (ref 65–99)
POTASSIUM: 4.7 mmol/L (ref 3.5–5.2)
SODIUM: 142 mmol/L (ref 134–144)
TOTAL PROTEIN: 7.6 g/dL (ref 6.0–8.5)

## 2017-01-21 LAB — CBC WITH DIFFERENTIAL/PLATELET
BASOS: 0 %
Basophils Absolute: 0 10*3/uL (ref 0.0–0.2)
EOS (ABSOLUTE): 0.1 10*3/uL (ref 0.0–0.4)
EOS: 1 %
HEMATOCRIT: 49.5 % (ref 37.5–51.0)
Hemoglobin: 16.5 g/dL (ref 13.0–17.7)
IMMATURE GRANULOCYTES: 0 %
Immature Grans (Abs): 0 10*3/uL (ref 0.0–0.1)
Lymphocytes Absolute: 1.9 10*3/uL (ref 0.7–3.1)
Lymphs: 24 %
MCH: 31.9 pg (ref 26.6–33.0)
MCHC: 33.3 g/dL (ref 31.5–35.7)
MCV: 96 fL (ref 79–97)
MONOS ABS: 0.7 10*3/uL (ref 0.1–0.9)
Monocytes: 8 %
NEUTROS ABS: 5.1 10*3/uL (ref 1.4–7.0)
NEUTROS PCT: 67 %
Platelets: 252 10*3/uL (ref 150–379)
RBC: 5.18 x10E6/uL (ref 4.14–5.80)
RDW: 13.3 % (ref 12.3–15.4)
WBC: 7.8 10*3/uL (ref 3.4–10.8)

## 2017-01-21 LAB — LIPID PANEL
Chol/HDL Ratio: 4.5 ratio (ref 0.0–5.0)
Cholesterol, Total: 195 mg/dL (ref 100–199)
HDL: 43 mg/dL (ref >39)
LDL CALC: 128 mg/dL — AB (ref 0–99)
Triglycerides: 119 mg/dL (ref 0–149)
VLDL Cholesterol Cal: 24 mg/dL (ref 5–40)

## 2017-01-21 LAB — HEMOGLOBIN A1C
ESTIMATED AVERAGE GLUCOSE: 123 mg/dL
Hgb A1c MFr Bld: 5.9 % — ABNORMAL HIGH (ref 4.8–5.6)

## 2017-01-21 LAB — TSH: TSH: 3.77 u[IU]/mL (ref 0.450–4.500)

## 2017-02-04 DIAGNOSIS — Z6829 Body mass index (BMI) 29.0-29.9, adult: Secondary | ICD-10-CM | POA: Insufficient documentation

## 2017-02-17 ENCOUNTER — Encounter: Payer: Self-pay | Admitting: Family Medicine

## 2017-02-17 ENCOUNTER — Telehealth: Payer: Self-pay | Admitting: Family Medicine

## 2017-02-17 NOTE — Telephone Encounter (Signed)
This encounter was created in error - please disregard.

## 2017-02-17 NOTE — Telephone Encounter (Signed)
Copied from [place] [phone]. Topic: Quick Communication - See Telephone Encounter >> Feb 17, 2017  4:05 PM Cleaster Cor[place], [place] wrote: CRM for notification. See Telephone encounter for: 02/17/17.  Pt. Wife calling to speak with a nurse asking what is the Zoster vaccination is for before her husband gets the vaccine.

## 2017-02-18 ENCOUNTER — Encounter: Payer: Self-pay | Admitting: Family Medicine

## 2017-02-19 NOTE — Telephone Encounter (Signed)
Pt has a written RX for Shingrix and wife wanted to notify office that RX ends today and wife doesn't want pt to get vaccination while he is sick with a sinus infection.  Asked pt's wife to call office when pt is well to get new RX. Pt's wife states she can come by and pick it up.

## 2017-02-19 NOTE — Telephone Encounter (Signed)
Left message to phone office for Zoster vaccination information.

## 2017-05-14 ENCOUNTER — Other Ambulatory Visit: Payer: Self-pay | Admitting: Family Medicine

## 2017-05-22 ENCOUNTER — Other Ambulatory Visit: Payer: Self-pay | Admitting: Family Medicine

## 2017-05-22 NOTE — Telephone Encounter (Signed)
Patient spouse calling and checking status, please advise. Call back [phone]

## 2017-05-22 NOTE — Telephone Encounter (Signed)
Refill request for Xanax.  Per refill note on 05-14-17 it looks like medication was denied, but I can not see a reason.  Patient is checking status again / LOV 01/20/17 with Dr. Tamala Julian /

## 2017-05-23 NOTE — Telephone Encounter (Signed)
Copied from [place]. Topic: Quick Communication - Rx Refill/Question >> May 23, 2017  2:26 PM Percell Belt A wrote: Medication: Xanex   Has the patient contacted their pharmacy?    (Agent: If no, request that the patient contact the pharmacy for the refill.)   Preferred Pharmacy (with phone number or street name): [facility] in [place] - pt wife called in and stated pt only has 2 tabs left and wants this med filled today    Agent: Please be advised that RX refills may take up to 3 business days. We ask that you follow-up with your pharmacy.

## 2017-06-30 ENCOUNTER — Ambulatory Visit: Payer: PPO | Admitting: Family Medicine

## 2017-08-05 ENCOUNTER — Encounter: Payer: Self-pay | Admitting: Family Medicine

## 2017-08-10 ENCOUNTER — Other Ambulatory Visit: Payer: Self-pay | Admitting: Family Medicine

## 2017-08-11 ENCOUNTER — Other Ambulatory Visit: Payer: Self-pay

## 2017-08-11 ENCOUNTER — Encounter: Payer: Self-pay | Admitting: Family Medicine

## 2017-08-11 ENCOUNTER — Ambulatory Visit (INDEPENDENT_AMBULATORY_CARE_PROVIDER_SITE_OTHER): Payer: PPO | Admitting: Family Medicine

## 2017-08-11 VITALS — BP 122/82 | HR 70 | Temp 98.0°F | Resp 16 | Ht 72.0 in | Wt 211.0 lb

## 2017-08-11 DIAGNOSIS — Z0000 Encounter for general adult medical examination without abnormal findings: Secondary | ICD-10-CM

## 2017-08-11 DIAGNOSIS — J301 Allergic rhinitis due to pollen: Secondary | ICD-10-CM

## 2017-08-11 DIAGNOSIS — Z125 Encounter for screening for malignant neoplasm of prostate: Secondary | ICD-10-CM | POA: Diagnosis not present

## 2017-08-11 DIAGNOSIS — N5203 Combined arterial insufficiency and corporo-venous occlusive erectile dysfunction: Secondary | ICD-10-CM

## 2017-08-11 DIAGNOSIS — R7309 Other abnormal glucose: Secondary | ICD-10-CM | POA: Diagnosis not present

## 2017-08-11 DIAGNOSIS — I479 Paroxysmal tachycardia, unspecified: Secondary | ICD-10-CM

## 2017-08-11 DIAGNOSIS — K219 Gastro-esophageal reflux disease without esophagitis: Secondary | ICD-10-CM | POA: Diagnosis not present

## 2017-08-11 DIAGNOSIS — R7302 Impaired glucose tolerance (oral): Secondary | ICD-10-CM | POA: Diagnosis not present

## 2017-08-11 DIAGNOSIS — Z85828 Personal history of other malignant neoplasm of skin: Secondary | ICD-10-CM | POA: Diagnosis not present

## 2017-08-11 DIAGNOSIS — Z8673 Personal history of transient ischemic attack (TIA), and cerebral infarction without residual deficits: Secondary | ICD-10-CM | POA: Diagnosis not present

## 2017-08-11 DIAGNOSIS — E7800 Pure hypercholesterolemia, unspecified: Secondary | ICD-10-CM

## 2017-08-11 DIAGNOSIS — F4323 Adjustment disorder with mixed anxiety and depressed mood: Secondary | ICD-10-CM | POA: Diagnosis not present

## 2017-08-11 DIAGNOSIS — F5104 Psychophysiologic insomnia: Secondary | ICD-10-CM

## 2017-08-11 LAB — POCT URINALYSIS DIP (MANUAL ENTRY)
Bilirubin, UA: NEGATIVE
Blood, UA: NEGATIVE
Glucose, UA: NEGATIVE mg/dL
Ketones, POC UA: NEGATIVE mg/dL
LEUKOCYTES UA: NEGATIVE
Nitrite, UA: NEGATIVE
Protein Ur, POC: NEGATIVE mg/dL
SPEC GRAV UA: 1.015 (ref 1.010–1.025)
UROBILINOGEN UA: 0.2 U/dL
pH, UA: 6.5 (ref 5.0–8.0)

## 2017-08-11 MED ORDER — SERTRALINE HCL 50 MG PO TABS: 50.0000 mg | ORAL_TABLET | Freq: Every day | ORAL | 3 refills | Status: DC

## 2017-08-11 MED ORDER — OMEPRAZOLE 40 MG PO CPDR: DELAYED_RELEASE_CAPSULE | ORAL | 3 refills | Status: DC

## 2017-08-11 MED ORDER — ATORVASTATIN CALCIUM 40 MG PO TABS: ORAL_TABLET | ORAL | 3 refills | Status: DC

## 2017-08-11 NOTE — Progress Notes (Signed)
Subjective:    Patient ID: Tyrone Curtis, male    DOB: [birth year], 67 y.o.   MRN: [id number]  08/11/2017  Annual Exam    HPI This 67 y.o. male presents for evaluation of ANNUAL WELLNESS EXAMINATION, ANNUAL WELLNESS VISIT, [name].  Last physical 07/16/2016. Colonoscopy 2014; repeat in five years. No eye exam. Dental exam every six months.   Visual Acuity Screening   Right eye Left eye Both eyes  Without correction: 20/13-1 20/15-1 20/13  With correction:       BP Readings from Last 3 Encounters:  08/11/17 122/82  01/20/17 130/82  08/23/16 138/80   Wt Readings from Last 3 Encounters:  08/11/17 211 lb (95.7 kg)  01/20/17 214 lb (97.1 kg)  08/23/16 224 lb 4 oz (101.7 kg)   Immunization History  Administered Date(s) Administered  . Influenza Split 01/02/2007, 02/06/2009, 04/03/2011, 12/16/2011  . Influenza,inj,Quad PF,6+ Mos 01/03/2014, 01/09/2015, 01/16/2016, 01/20/2017  . Influenza-Unspecified 12/04/2012  . Pneumococcal Conjugate-13 08/01/2014  . Pneumococcal Polysaccharide-23 07/16/2016  . Tdap 04/30/2011  . Zoster 12/29/2014   Health Maintenance  Topic Date Due  . INFLUENZA VACCINE  10/09/2017  . TETANUS/TDAP  04/29/2021  . COLONOSCOPY  09/25/2022  . Hepatitis C Screening  Completed  . PNA vac Low Risk Adult  Completed    Hypercholesterolemia: Patient reports good compliance with medication, good tolerance to medication, and good symptom control.    GERD: Patient reports good compliance with medication, good tolerance to medication, and good symptom control.    Anxiety and depression: Patient reports good compliance with medication, good tolerance to medication, and good symptom control.    Glucose Intolerance: has lost 13 pounds in past year.  History of CVA: doing well.  No concerns at this time.  Review of Systems  Constitutional: Negative for activity change, appetite change, chills, diaphoresis, fatigue,  fever and unexpected weight change.  HENT: Positive for ear discharge, hearing loss and sinus pressure. Negative for congestion, dental problem, drooling, ear pain, facial swelling, mouth sores, nosebleeds, postnasal drip, rhinorrhea, sneezing, sore throat, tinnitus, trouble swallowing and voice change.   Eyes: Negative for photophobia, pain, discharge, redness, itching and visual disturbance.  Respiratory: Negative for apnea, cough, choking, chest tightness, shortness of breath, wheezing and stridor.   Cardiovascular: Negative for chest pain, palpitations and leg swelling.  Gastrointestinal: Negative for abdominal pain, blood in stool, constipation, diarrhea, nausea and vomiting.  Endocrine: Negative for cold intolerance, heat intolerance, polydipsia, polyphagia and polyuria.  Genitourinary: Negative for decreased urine volume, difficulty urinating, discharge, dysuria, enuresis, flank pain, frequency, genital sores, hematuria, penile pain, penile swelling, scrotal swelling, testicular pain and urgency.       Nocturia x 1-2.  Urinary stream is strong.  Musculoskeletal: Negative for arthralgias, back pain, gait problem, joint swelling, myalgias, neck pain and neck stiffness.  Skin: Negative for color change, pallor, rash and wound.  Allergic/Immunologic: Positive for environmental allergies. Negative for food allergies and immunocompromised state.  Neurological: Negative for dizziness, tremors, seizures, syncope, facial asymmetry, speech difficulty, weakness, light-headedness, numbness and headaches.  Hematological: Negative for adenopathy. Does not bruise/bleed easily.  Psychiatric/Behavioral: Negative for agitation, behavioral problems, confusion, decreased concentration, dysphoric mood, hallucinations, self-injury, sleep disturbance and suicidal ideas. The patient is not nervous/anxious and is not hyperactive.        Bedtime 9-10.  Wakes up 630-7.    Past Medical History:  Diagnosis Date  .  Allergic rhinitis, cause unspecified   . Allergy   .  Anxiety state, unspecified   . Arthritis   . Basal cell carcinoma    facial  . Cataract   . Chicken pox   . CVA (cerebral vascular accident) ([place])   . Depression   . 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 Dysphagia, unspecified(787.20)   . Erectile dysfunction   . Herpes zoster without mention of complication   . Internal hemorrhoids without mention of complication   . Measles   . Mumps    x 2  . Personal history of colonic polyps   . Pure hypercholesterolemia   . Regurgitation    Past Surgical History:  Procedure Laterality Date  . basal cell carcinoma of anterior chest  2000   resection; [facility]  . cataract surgery  2010   with lens repacement Left  . COLONOSCOPY  09/23/12   normal.  Symptoms: anemia, hemoccult+.  [facility].  . ESOPHAGOGASTRODUODENOSCOPY  09/23/12   normal.  Symptoms: anemia, hemoccult +, GERD.  Marland Kitchen EYE SURGERY Bilateral    cataract  . FRACTURE SURGERY Left    1957 left arm  . small bowel mass  1991   removed part of small intestine benign mass  . SMALL INTESTINE SURGERY     Allergies  Allergen Reactions  . Zyrtec D [Cetirizine-Pseudoephedrine Er]     "talking out of his head"   Current Outpatient Medications on File Prior to Visit  Medication Sig Dispense Refill  . ASPIRIN 81 PO Take 81 mg by mouth 2 (two) times daily.    Marland Kitchen loratadine (CLARITIN) 10 MG tablet Take 10 mg by mouth daily.    . Multiple Vitamin (MULTIVITAMINS PO) Take by mouth daily.     No current facility-administered medications on file prior to visit.    Social History   Socioeconomic History  . Marital status: Married    Spouse name: Not on file  . Number of children: 1  . Years of education: Not on file  . Highest education level: Not on file  Occupational History  . Occupation: Disability    Comment: CVA  . Occupation: Previous Writer  Social Needs  . Financial  resource strain: Not on file  . Food insecurity:    Worry: Not on file    Inability: Not on file  . Transportation needs:    Medical: Not on file    Non-medical: Not on file  Tobacco Use  . Smoking status: Former Smoker    Packs/day: 2.00    Years: 25.00    Pack years: 50.00    Types: Cigarettes  . Smokeless tobacco: Never Used  . Tobacco comment: quit in 1984  Substance and Sexual Activity  . Alcohol use: No    Comment: 3-5 drinks  . Drug use: No  . Sexual activity: Yes    Birth control/protection: Post-menopausal  Lifestyle  . Physical activity:    Days per week: Not on file    Minutes per session: Not on file  . Stress: Not on file  Relationships  . Social connections:    Talks on phone: Not on file    Gets together: Not on file    Attends religious service: Not on file    Active member of club or organization: Not on file    Attends meetings of clubs or organizations: Not on file    Relationship status: Not on file  . Intimate partner violence:    Fear of current or ex partner: Not on file  Emotionally abused: Not on file    Physically abused: Not on file    Forced sexual activity: Not on file  Other Topics Concern  . Not on file  Social History Narrative   Marital status:  Married x 43 years      Children: 1 daughte (45)r; 1 grandson (58)      Lives: with wife; has mountain house and [place] house at [employer]      Employment: disabled since CVA      Tobacco: none      Alcohol:  1-2 drinks per night      Drugs:  None      Exercise: very active in the yard. No formal exercise program in 2019.      ADLs: independent with ADLs; drives locally; maintains own yard.        Advanced Directives:  NONE in 2019.  FULL CODE; no prolonged measures.         Family History  Problem Relation Age of Onset  . Hyperlipidemia Sister   . Mental illness Sister        anxiety  . Liver disease Sister   . Hyperlipidemia Brother   . Diabetes Brother   . Arthritis Brother         Knee replacement  . Bipolar disorder Brother   . Liver disease Brother   . Heart disease Brother 38       AMI/CAD with stenting  . Diabetes Mother   . Depression Mother   . Esophageal varices Mother   . Hyperlipidemia Mother   . Mental illness Mother   . Liver disease Mother   . Cancer Father        unknown       Objective:    BP 122/82   Pulse 70   Temp 98 F (36.7 C) (Oral)   Resp 16   Ht 6' (1.829 m)   Wt 211 lb (95.7 kg)   SpO2 97%   BMI 28.62 kg/m  Physical Exam  Constitutional: He is oriented to person, place, and time. He appears well-developed and well-nourished. No distress.  HENT:  Head: Normocephalic and atraumatic.  Right Ear: External ear normal.  Left Ear: External ear normal.  Nose: Nose normal.  Mouth/Throat: Oropharynx is clear and moist.  Eyes: Pupils are equal, round, and reactive to light. Conjunctivae and EOM are normal.  Neck: Normal range of motion. Neck supple. Carotid bruit is not present. No thyromegaly present.  Cardiovascular: Normal rate, regular rhythm, normal heart sounds and intact distal pulses. Exam reveals no gallop and no friction rub.  No murmur heard. Pulmonary/Chest: Effort normal and breath sounds normal. He has no wheezes. He has no rales.  Abdominal: Soft. Bowel sounds are normal. He exhibits no distension and no mass. There is no tenderness. There is no rebound and no guarding. Hernia confirmed negative in the right inguinal area and confirmed negative in the left inguinal area.  Genitourinary: Prostate normal, testes normal and penis normal. Rectal exam shows external hemorrhoid.  Musculoskeletal:       Right shoulder: Normal.       Left shoulder: Normal.       Cervical back: Normal.  Lymphadenopathy:    He has no cervical adenopathy. No inguinal adenopathy noted on the right or left side.  Neurological: He is alert and oriented to person, place, and time. He has normal reflexes. No cranial nerve deficit. He exhibits  normal muscle tone. Coordination normal.  Skin: Skin  is warm and dry. No rash noted. He is not diaphoretic.  Psychiatric: He has a normal mood and affect. His behavior is normal. Judgment and thought content normal.   No results found. Depression screen [facility] 2/9 08/11/2017 01/20/2017 07/16/2016 01/16/2016 08/29/2015  Decreased Interest 0 0 0 0 0  Down, Depressed, Hopeless 0 0 0 0 0  PHQ - 2 Score 0 0 0 0 0  Altered sleeping - - - - -  Tired, decreased energy - - - - -  Change in appetite - - - - -  Feeling bad or failure about yourself  - - - - -  Trouble concentrating - - - - -  Moving slowly or fidgety/restless - - - - -  Suicidal thoughts - - - - -  PHQ-9 Score - - - - -   Fall Risk  08/11/2017 01/20/2017 07/16/2016 01/16/2016 08/29/2015  Falls in the past year? No Yes No No Yes  Comment - - - - -  Number falls in past yr: - 1 - - 2 or more  Injury with Fall? - No - - Yes  Comment - - - - hematoma on back   Functional Status Survey: Is the patient deaf or have difficulty hearing?: No Does the patient have difficulty seeing, even when wearing glasses/contacts?: Yes(loss of peripheral vision since CVA.) Does the patient have difficulty concentrating, remembering, or making decisions?: No Does the patient have difficulty walking or climbing stairs?: No Does the patient have difficulty dressing or bathing?: No Does the patient have difficulty doing errands alone such as visiting a doctor's office or shopping?: No      Assessment & Plan:   1. Encounter for Medicare annual wellness exam   2. Routine physical examination   3. Seasonal allergic rhinitis due to pollen   4. Gastroesophageal reflux disease without esophagitis   5. Combined arterial insufficiency and corporo-venous occlusive erectile dysfunction   6. Pure hypercholesterolemia   7. Encounter for prostate cancer screening   8. Paroxysmal tachycardia (HCC) Chronic  9. History of CVA (cerebrovascular accident)   10. Other  abnormal glucose   11. Psychophysiological insomnia   12. Adjustment disorder with mixed anxiety and depressed mood   13. History of basal cell carcinoma     -anticipatory guidance provided --- exercise, weight loss, safe driving practices, aspirin 81mg  daily. -obtain age appropriate screening labs and labs for chronic disease management.  -moderate fall risk; actively undergoing treatment for anxiety with depression; no evidence of hearing loss.  Discussed advanced directives and living will; also discussed end of life issues including code status.   -Hypercholesterolemia, abnormal glucose, anxiety with depression and insomnia, GERD well controlled on current regimens.  Patient has been able to successfully wean off of Xanax therapy for insomnia.  Refills provided without any changes today.  Congratulations on weight loss.  History of CVA: With minimal residual left-sided weakness and peripheral vision loss.  Doing well and functioning independently.  History of basal cell carcinoma: Stable.  Followed annually by Dr. Evorn Gong dermatology.  Orders Placed This Encounter  Procedures  . CBC with Differential/Platelet  . Comprehensive metabolic panel    Order Specific Question:   Has the patient fasted?    Answer:   No  . Hemoglobin A1c  . Lipid panel    Order Specific Question:   Has the patient fasted?    Answer:   No  . PSA  . POCT urinalysis dipstick   Meds ordered this encounter  Medications  . sertraline (ZOLOFT) 50 MG tablet    Sig: Take 1 tablet (50 mg total) by mouth daily.    Dispense:  90 tablet    Refill:  3  . omeprazole (PRILOSEC) 40 MG capsule    Sig: TAKE 1 CAPSULE(40 MG) BY MOUTH DAILY    Dispense:  90 capsule    Refill:  3  . atorvastatin (LIPITOR) 40 MG tablet    Sig: TAKE 1 TABLET(40 MG) BY MOUTH DAILY    Dispense:  90 tablet    Refill:  3    Return in about 6 months (around 02/10/2018) for follow-up chronic medical conditions.   Kristi Elayne Guerin,  M.D. Primary [facility] at [facility] [address] [place], [place]  [address] [phone] phone [phone] fax

## 2017-08-11 NOTE — Patient Instructions (Addendum)
PLEASE CREATE A LIVING WILL.    IF you received an x-ray today, you will receive an invoice from [facility]. Please contact [facility] at [phone] with questions or concerns regarding your invoice.   IF you received labwork today, you will receive an invoice from [place]. Please contact [facility] at [phone] with questions or concerns regarding your invoice.   Our billing staff will not be able to assist you with questions regarding bills from these companies.  You will be contacted with the lab results as soon as they are available. The fastest way to get your results is to activate your My Chart account. Instructions are located on the last page of this paperwork. If you have not heard from [place] regarding the results in 2 weeks, please contact this office.     Advance Directive Advance directives are legal documents that let you make choices ahead of time about your health care and medical treatment in case you become unable to communicate for yourself. Advance directives are a way for you to communicate your wishes to family, friends, and health care providers. This can help convey your decisions about end-of-life care if you become unable to communicate. Discussing and writing advance directives should happen over time rather than all at once. Advance directives can be changed depending on your situation and what you want, even after you have signed the advance directives. If you do not have an advance directive, some states assign family decision makers to act on your behalf based on how closely you are related to them. Each state has its own laws regarding advance directives. You may want to check with your health care provider, attorney, or state representative about the laws in your state. There are different types of advance directives, such as:  Medical power of attorney.  Living will.  Do not resuscitate (DNR) or do not attempt resuscitation (DNAR)  order.  Health care proxy and medical power of attorney A health care proxy, also called a health care agent, is a person who is appointed to make medical decisions for you in cases in which you are unable to make the decisions yourself. Generally, people choose someone they know well and trust to represent their preferences. Make sure to ask this person for an agreement to act as your proxy. A proxy may have to exercise judgment in the event of a medical decision for which your wishes are not known. A medical power of attorney is a legal document that names your health care proxy. Depending on the laws in your state, after the document is written, it may also need to be:  Signed.  Notarized.  Dated.  Copied.  Witnessed.  Incorporated into your medical record.  You may also want to appoint someone to manage your financial affairs in a situation in which you are unable to do so. This is called a durable power of attorney for finances. It is a separate legal document from the durable power of attorney for health care. You may choose the same person or someone different from your health care proxy to act as your agent in financial matters. If you do not appoint a proxy, or if there is a concern that the proxy is not acting in your best interests, a court-appointed guardian may be designated to act on your behalf. Living will A living will is a set of instructions documenting your wishes about medical care when you cannot express them yourself. Health care providers should keep a  copy of your living will in your medical record. You may want to give a copy to family members or friends. To alert caregivers in case of an emergency, you can place a card in your wallet to let them know that you have a living will and where they can find it. A living will is used if you become:  Terminally ill.  Incapacitated.  Unable to communicate or make decisions.  Items to consider in your living will  include:  The use or non-use of life-sustaining equipment, such as dialysis machines and breathing machines (ventilators).  A DNR or DNAR order, which is the instruction not to use cardiopulmonary resuscitation (CPR) if breathing or heartbeat stops.  The use or non-use of tube feeding.  Withholding of food and fluids.  Comfort (palliative) care when the goal becomes comfort rather than a cure.  Organ and tissue donation.  A living will does not give instructions for distributing your money and property if you should pass away. It is recommended that you seek the advice of a lawyer when writing a will. Decisions about taxes, beneficiaries, and asset distribution will be legally binding. This process can relieve your family and friends of any concerns surrounding disputes or questions that may come up about the distribution of your assets. DNR or DNAR A DNR or DNAR order is a request not to have CPR in the event that your heart stops beating or you stop breathing. If a DNR or DNAR order has not been made and shared, a health care provider will try to help any patient whose heart has stopped or who has stopped breathing. If you plan to have surgery, talk with your health care provider about how your DNR or DNAR order will be followed if problems occur. Summary  Advance directives are the legal documents that allow you to make choices ahead of time about your health care and medical treatment in case you become unable to communicate for yourself.  The process of discussing and writing advance directives should happen over time. You can change the advance directives, even after you have signed them.  Advance directives include DNR or DNAR orders, living wills, and designating an agent as your medical power of attorney. This information is not intended to replace advice given to you by your health care provider. Make sure you discuss any questions you have with your health care provider. Document  Released: 06/04/2007 Document Revised: 01/15/2016 Document Reviewed: 01/15/2016 Elsevier Interactive Patient Education  2017 [place] 69 Years and Older, Male Preventive care refers to lifestyle choices and visits with your health care provider that can promote health and wellness. What does preventive care include?  A yearly physical exam. This is also called an annual well check.  Dental exams once or twice a year.  Routine eye exams. Ask your health care provider how often you should have your eyes checked.  Personal lifestyle choices, including: ? Daily care of your teeth and gums. ? Regular physical activity. ? Eating a healthy diet. ? Avoiding tobacco and drug use. ? Limiting alcohol use. ? Practicing safe sex. ? Taking low doses of aspirin every day. ? Taking vitamin and mineral supplements as recommended by your health care provider. What happens during an annual well check? The services and screenings done by your health care provider during your annual well check will depend on your age, overall health, lifestyle risk factors, and family history of disease. Counseling Your health care provider  may ask you questions about your:  Alcohol use.  Tobacco use.  Drug use.  Emotional well-being.  Home and relationship well-being.  Sexual activity.  Eating habits.  History of falls.  Memory and ability to understand (cognition).  Work and work Statistician.  Screening You may have the following tests or measurements:  Height, weight, and BMI.  Blood pressure.  Lipid and cholesterol levels. These may be checked every 5 years, or more frequently if you are over 56 years old.  Skin check.  Lung cancer screening. You may have this screening every year starting at age 21 if you have a 30-pack-year history of smoking and currently smoke or have quit within the past 15 years.  Fecal occult blood test (FOBT) of the stool. You may have this  test every year starting at age 79.  Flexible sigmoidoscopy or colonoscopy. You may have a sigmoidoscopy every 5 years or a colonoscopy every 10 years starting at age 2.  Prostate cancer screening. Recommendations will vary depending on your family history and other risks.  Hepatitis C blood test.  Hepatitis B blood test.  Sexually transmitted disease (STD) testing.  Diabetes screening. This is done by checking your blood sugar (glucose) after you have not eaten for a while (fasting). You may have this done every 1-3 years.  Abdominal aortic aneurysm (AAA) screening. You may need this if you are a current or former smoker.  Osteoporosis. You may be screened starting at age 28 if you are at high risk.  Talk with your health care provider about your test results, treatment options, and if necessary, the need for more tests. Vaccines Your health care provider may recommend certain vaccines, such as:  Influenza vaccine. This is recommended every year.  Tetanus, diphtheria, and acellular pertussis (Tdap, Td) vaccine. You may need a Td booster every 10 years.  Varicella vaccine. You may need this if you have not been vaccinated.  Zoster vaccine. You may need this after age 56.  Measles, mumps, and rubella (MMR) vaccine. You may need at least one dose of MMR if you were born in 1957 or later. You may also need a second dose.  Pneumococcal 13-valent conjugate (PCV13) vaccine. One dose is recommended after age 92.  Pneumococcal polysaccharide (PPSV23) vaccine. One dose is recommended after age 63.  Meningococcal vaccine. You may need this if you have certain conditions.  Hepatitis A vaccine. You may need this if you have certain conditions or if you travel or work in places where you may be exposed to hepatitis A.  Hepatitis B vaccine. You may need this if you have certain conditions or if you travel or work in places where you may be exposed to hepatitis B.  Haemophilus influenzae  type b (Hib) vaccine. You may need this if you have certain risk factors.  Talk to your health care provider about which screenings and vaccines you need and how often you need them. This information is not intended to replace advice given to you by your health care provider. Make sure you discuss any questions you have with your health care provider. Document Released: 03/24/2015 Document Revised: 11/15/2015 Document Reviewed: 12/27/2014 Elsevier Interactive Patient Education  [employer].

## 2017-08-12 DIAGNOSIS — Z85828 Personal history of other malignant neoplasm of skin: Secondary | ICD-10-CM | POA: Insufficient documentation

## 2017-08-12 LAB — COMPREHENSIVE METABOLIC PANEL
ALT: 23 IU/L (ref 0–44)
AST: 23 IU/L (ref 0–40)
Albumin/Globulin Ratio: 1.6 (ref 1.2–2.2)
Albumin: 4.4 g/dL (ref 3.6–4.8)
Alkaline Phosphatase: 87 IU/L (ref 39–117)
BILIRUBIN TOTAL: 0.9 mg/dL (ref 0.0–1.2)
BUN/Creatinine Ratio: 11 (ref 10–24)
BUN: 11 mg/dL (ref 8–27)
CALCIUM: 9.6 mg/dL (ref 8.6–10.2)
CHLORIDE: 102 mmol/L (ref 96–106)
CO2: 21 mmol/L (ref 20–29)
Creatinine, Ser: 0.99 mg/dL (ref 0.76–1.27)
GFR, EST AFRICAN AMERICAN: 89 mL/min/{1.73_m2} (ref >59)
GFR, EST NON AFRICAN AMERICAN: 77 mL/min/{1.73_m2} (ref >59)
GLUCOSE: 98 mg/dL (ref 65–99)
Globulin, Total: 2.7 g/dL (ref 1.5–4.5)
Potassium: 4.7 mmol/L (ref 3.5–5.2)
Sodium: 138 mmol/L (ref 134–144)
TOTAL PROTEIN: 7.1 g/dL (ref 6.0–8.5)

## 2017-08-12 LAB — CBC WITH DIFFERENTIAL/PLATELET
BASOS ABS: 0 10*3/uL (ref 0.0–0.2)
Basos: 0 %
EOS (ABSOLUTE): 0.1 10*3/uL (ref 0.0–0.4)
Eos: 1 %
Hematocrit: 46.2 % (ref 37.5–51.0)
Hemoglobin: 15.6 g/dL (ref 13.0–17.7)
IMMATURE GRANS (ABS): 0 10*3/uL (ref 0.0–0.1)
IMMATURE GRANULOCYTES: 0 %
LYMPHS: 27 %
Lymphocytes Absolute: 1.8 10*3/uL (ref 0.7–3.1)
MCH: 31.6 pg (ref 26.6–33.0)
MCHC: 33.8 g/dL (ref 31.5–35.7)
MCV: 94 fL (ref 79–97)
Monocytes Absolute: 0.7 10*3/uL (ref 0.1–0.9)
Monocytes: 10 %
NEUTROS PCT: 62 %
Neutrophils Absolute: 4.1 10*3/uL (ref 1.4–7.0)
PLATELETS: 245 10*3/uL (ref 150–450)
RBC: 4.94 x10E6/uL (ref 4.14–5.80)
RDW: 13.3 % (ref 12.3–15.4)
WBC: 6.6 10*3/uL (ref 3.4–10.8)

## 2017-08-12 LAB — HEMOGLOBIN A1C
Est. average glucose Bld gHb Est-mCnc: 117 mg/dL
Hgb A1c MFr Bld: 5.7 % — ABNORMAL HIGH (ref 4.8–5.6)

## 2017-08-12 LAB — LIPID PANEL
CHOLESTEROL TOTAL: 193 mg/dL (ref 100–199)
Chol/HDL Ratio: 4.1 ratio (ref 0.0–5.0)
HDL: 47 mg/dL (ref >39)
LDL CALC: 118 mg/dL — AB (ref 0–99)
TRIGLYCERIDES: 141 mg/dL (ref 0–149)
VLDL Cholesterol Cal: 28 mg/dL (ref 5–40)

## 2017-08-12 LAB — PSA: Prostate Specific Ag, Serum: 1.1 ng/mL (ref 0.0–4.0)

## 2017-10-16 DIAGNOSIS — Z08 Encounter for follow-up examination after completed treatment for malignant neoplasm: Secondary | ICD-10-CM | POA: Diagnosis not present

## 2017-10-16 DIAGNOSIS — X32XXXA Exposure to sunlight, initial encounter: Secondary | ICD-10-CM | POA: Diagnosis not present

## 2017-10-16 DIAGNOSIS — Z85828 Personal history of other malignant neoplasm of skin: Secondary | ICD-10-CM | POA: Diagnosis not present

## 2017-10-16 DIAGNOSIS — D485 Neoplasm of uncertain behavior of skin: Secondary | ICD-10-CM | POA: Diagnosis not present

## 2017-10-16 DIAGNOSIS — L570 Actinic keratosis: Secondary | ICD-10-CM | POA: Diagnosis not present

## 2017-10-16 DIAGNOSIS — L728 Other follicular cysts of the skin and subcutaneous tissue: Secondary | ICD-10-CM | POA: Diagnosis not present

## 2017-10-30 DIAGNOSIS — L570 Actinic keratosis: Secondary | ICD-10-CM | POA: Diagnosis not present

## 2018-01-02 ENCOUNTER — Encounter: Payer: Self-pay | Admitting: Family Medicine

## 2018-01-02 DIAGNOSIS — E663 Overweight: Secondary | ICD-10-CM | POA: Insufficient documentation

## 2018-01-02 DIAGNOSIS — R7303 Prediabetes: Secondary | ICD-10-CM | POA: Insufficient documentation

## 2018-01-05 ENCOUNTER — Ambulatory Visit: Payer: Self-pay | Admitting: Family Medicine

## 2018-01-22 ENCOUNTER — Ambulatory Visit (INDEPENDENT_AMBULATORY_CARE_PROVIDER_SITE_OTHER): Payer: PPO | Admitting: Family Medicine

## 2018-01-22 ENCOUNTER — Encounter: Payer: Self-pay | Admitting: Family Medicine

## 2018-01-22 VITALS — BP 134/80 | HR 73 | Temp 97.4°F | Resp 16 | Ht 70.0 in | Wt 217.5 lb

## 2018-01-22 DIAGNOSIS — I69354 Hemiplegia and hemiparesis following cerebral infarction affecting left non-dominant side: Secondary | ICD-10-CM

## 2018-01-22 DIAGNOSIS — R7303 Prediabetes: Secondary | ICD-10-CM

## 2018-01-22 DIAGNOSIS — F5104 Psychophysiologic insomnia: Secondary | ICD-10-CM | POA: Diagnosis not present

## 2018-01-22 DIAGNOSIS — K219 Gastro-esophageal reflux disease without esophagitis: Secondary | ICD-10-CM | POA: Diagnosis not present

## 2018-01-22 DIAGNOSIS — Z87891 Personal history of nicotine dependence: Secondary | ICD-10-CM

## 2018-01-22 DIAGNOSIS — E7800 Pure hypercholesterolemia, unspecified: Secondary | ICD-10-CM | POA: Diagnosis not present

## 2018-01-22 DIAGNOSIS — Z23 Encounter for immunization: Secondary | ICD-10-CM | POA: Diagnosis not present

## 2018-01-22 DIAGNOSIS — Z85828 Personal history of other malignant neoplasm of skin: Secondary | ICD-10-CM

## 2018-01-22 DIAGNOSIS — E663 Overweight: Secondary | ICD-10-CM | POA: Diagnosis not present

## 2018-01-22 NOTE — Progress Notes (Signed)
Name: Tyrone Curtis   MRN: [id number]    DOB: 1950-12-08   Date:01/22/2018       Progress Note  Subjective  Chief Complaint  Chief Complaint  Patient presents with  . Establish Care    HPI  PT presents to establish care after his PCP moved to practice with [facility] and his insurance no longer covered him.  He used to see Dr. Reginia Forts, her records are reviewed.   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   Hx Stroke: He had a stroke in February 26, 2011.  He lost depth perception and peripheral vision in the LEFT eye and has ongoing LEFT sided deficits - numbness and some lingering weakness.  He is doing well today with no concerns. Former smoker.  Taking atorvastatin and 81mg  ASA daily.  He used to see Dr. Manuella Ghazi - was released 3-4 years ago, but was told to return if needed.  Hypercholesterolemia: Patient reports good compliance with medication, good tolerance to medication.  His last lipids showed LDL of 118 - he notes he did not watch his diet this summer because he was at the beach with his wife. No chest pain, shortness of breath, or myalgias. We will check again in December at the 6 month mark.  Sees Dr. Rockey Situ.  GERD: Patient reports good compliance with medication, good tolerance to medication, and good symptom control.  He takes omeprazole 40mg  daily and states that this works very well for him and that he is not willing to switch to H2 blocker. Does have occasional regurgitation.  Anxiety and depression: Patient reports good compliance with medication, good tolerance to medication, and good symptom control.  He used to take Xanax, but read about it and wanted to come off, so he stopped taking the medication.  He is taking Zoloft 50mg  and does well on this medication. He does have some insomnia, but usually "works through it" on his own.  Glucose Intolerance: Last A1C  was 5.7%.  Denies polyphagia, polydipsia, or polyuria. We will recheck labs in December.  Hx basal cell Carcinoma: Seeing Dermatology every 6 months - Dr. Evorn Gong  Patient Active Problem List   Diagnosis Date Noted  . Prediabetes 01/02/2018  . Overweight (BMI 25.0-29.9) 01/02/2018  . History of basal cell carcinoma 08/12/2017  . Paroxysmal tachycardia ([place]) 08/23/2016  . Smoking history 08/23/2016  . Adjustment disorder with mixed anxiety and depressed mood 07/16/2016  . Elevated cholesterol 01/06/2014  . Gastroesophageal reflux disease without esophagitis 01/06/2014  . Hemiparesis affecting left side as late effect of cerebrovascular accident (CVA) ([place]) 01/06/2014  . Insomnia 07/26/2013  . Allergic rhinitis 12/16/2011  . Erectile dysfunction 12/16/2011    Past Surgical History:  Procedure Laterality Date  . basal cell carcinoma of anterior chest  2000   resection; [facility]  . cataract surgery  2010   with lens repacement Left  . COLONOSCOPY  09/23/12   normal.  Symptoms: anemia, hemoccult+.  [facility].  . ESOPHAGOGASTRODUODENOSCOPY  09/23/12   normal.  Symptoms: anemia, hemoccult +, GERD.  Marland Kitchen EYE SURGERY Bilateral    cataract  . FRACTURE SURGERY Left    1957 left arm  . small bowel mass  1991   removed part of small intestine benign mass  . SMALL INTESTINE SURGERY      Family History  Problem Relation Age of Onset  .  Hyperlipidemia Sister   . Mental illness Sister        anxiety  . Liver disease Sister   . Hyperlipidemia Brother   . Diabetes Brother   . Arthritis Brother        Knee replacement  . Bipolar disorder Brother   . Liver disease Brother   . Heart disease Brother 82       AMI/CAD with stenting  . Diabetes Mother   . Depression Mother   . Esophageal varices Mother   . Hyperlipidemia Mother   . Mental illness Mother   . Liver disease Mother   . Cancer Father        unknown    Social History   Socioeconomic History  . Marital status:  Married    Spouse name: Not on file  . Number of children: 1  . Years of education: Not on file  . Highest education level: Not on file  Occupational History  . Occupation: Disability    Comment: CVA  . Occupation: Previous Writer  Social Needs  . Financial resource strain: Not hard at all  . Food insecurity:    Worry: Patient refused    Inability: Patient refused  . Transportation needs:    Medical: No    Non-medical: No  Tobacco Use  . Smoking status: Former Smoker    Packs/day: 2.00    Years: 25.00    Pack years: 50.00    Types: Cigarettes  . Smokeless tobacco: Never Used  . Tobacco comment: quit in 1984  Substance and Sexual Activity  . Alcohol use: Yes    Alcohol/week: 6.0 standard drinks    Types: 6 Cans of beer per week    Comment: 6 drinks/week  . Drug use: No  . Sexual activity: Yes    Birth control/protection: Post-menopausal  Lifestyle  . Physical activity:    Days per week: 7 days    Minutes per session: 80 min  . Stress: To some extent  Relationships  . Social connections:    Talks on phone: More than three times a week    Gets together: Three times a week    Attends religious service: Patient refused    Active member of club or organization: No    Attends meetings of clubs or organizations: Never    Relationship status: Married  . Intimate partner violence:    Fear of current or ex partner: No    Emotionally abused: No    Physically abused: No    Forced sexual activity: No  Other Topics Concern  . Not on file  Social History Narrative   Marital status:  Married x 43 years      Children: 1 daughte (45)r; 1 grandson (48)      Lives: with wife; has mountain house and [place] house at [employer]      Employment: disabled since CVA      Tobacco: none      Alcohol:  1-2 drinks per night      Drugs:  None      Exercise: very active in the yard. No formal exercise program in 2019.      ADLs: independent with ADLs; drives locally;  maintains own yard.        Advanced Directives:  NONE in 2019.  FULL CODE; no prolonged measures.           Current Outpatient Medications:  .  ASPIRIN 81 PO, Take 81 mg by mouth 2 (  two) times daily., Disp: , Rfl:  .  atorvastatin (LIPITOR) 40 MG tablet, TAKE 1 TABLET(40 MG) BY MOUTH DAILY, Disp: 90 tablet, Rfl: 3 .  loratadine (CLARITIN) 10 MG tablet, Take 10 mg by mouth daily., Disp: , Rfl:  .  Multiple Vitamin (MULTIVITAMINS PO), Take by mouth daily., Disp: , Rfl:  .  omeprazole (PRILOSEC) 40 MG capsule, TAKE 1 CAPSULE(40 MG) BY MOUTH DAILY, Disp: 90 capsule, Rfl: 3 .  psyllium (METAMUCIL) 58.6 % powder, Take 1 packet by mouth 3 (three) times daily., Disp: , Rfl:  .  sertraline (ZOLOFT) 50 MG tablet, TAKE 1 TABLET(50 MG) BY MOUTH DAILY, Disp: 90 tablet, Rfl: 3  Allergies  Allergen Reactions  . Zyrtec D [Cetirizine-Pseudoephedrine Er]     "talking out of his head"    I personally reviewed active problem list, medication list, allergies, family history, social history, health maintenance, lab results with the patient/caregiver today.   ROS Constitutional: Negative for fever or weight change.  Respiratory: Negative for cough and shortness of breath.   Cardiovascular: Negative for chest pain or palpitations.  Gastrointestinal: Negative for abdominal pain, no bowel changes.  Musculoskeletal: Negative for gait problem or joint swelling.  Skin: Negative for rash.  Neurological: Negative for dizziness or headache.  No other specific complaints in a complete review of systems (except as listed in HPI above).  Objective  Vitals:   01/22/18 1047  BP: 134/80  Pulse: 73  Resp: 16  Temp: (!) 97.4 F (36.3 C)  TempSrc: Oral  SpO2: 98%  Weight: 217 lb 8 oz (98.7 kg)  Height: 5\' 10"  (1.778 m)   Body mass index is 31.21 kg/m.  Physical Exam Constitutional: Patient appears well-developed and well-nourished. Obese No distress.  HEENT: head atraumatic,  normocephalic. Cardiovascular: Normal rate, regular rhythm and normal heart sounds.  No murmur heard. No BLE edema. Pulmonary/Chest: Effort normal and breath sounds clear bilaterally. No respiratory distress. Abdominal: Soft, bowel sounds normal, there is no tenderness, no HSM Psychiatric: Patient has a normal mood and affect. behavior is normal. Judgment and thought content normal. Musculoskeletal: Normal range of motion, no joint effusions. No gross deformities Neurological: Pt is alert and oriented to person, place, and time. No cranial nerve deficit. Coordination, balance, strength, speech and gait are normal.  Skin: Skin is warm and dry. No rash noted. No erythema.   No results found for this or any previous visit (from the past 72 hour(s)).  PHQ2/9: Depression screen [facility] 2/9 08/11/2017 01/20/2017 07/16/2016 01/16/2016 08/29/2015  Decreased Interest 0 0 0 0 0  Down, Depressed, Hopeless 0 0 0 0 0  PHQ - 2 Score 0 0 0 0 0  Altered sleeping - - - - -  Tired, decreased energy - - - - -  Change in appetite - - - - -  Feeling bad or failure about yourself  - - - - -  Trouble concentrating - - - - -  Moving slowly or fidgety/restless - - - - -  Suicidal thoughts - - - - -  PHQ-9 Score - - - - -   Fall Risk: Fall Risk  08/11/2017 01/20/2017 07/16/2016 01/16/2016 08/29/2015  Falls in the past year? No Yes No No Yes  Comment - - - - -  Number falls in past yr: - 1 - - 2 or more  Injury with Fall? - No - - Yes  Comment - - - - hematoma on back   Assessment & Plan  1. Hemiparesis  affecting left side as late effect of cerebrovascular accident (CVA) ([place]) - Stable  2. Elevated cholesterol - Will check at follow up, continue atorvastatin  3. Gastroesophageal reflux disease without esophagitis - Stable  4. Flu vaccine need - Flu vaccine HIGH DOSE PF  5. Prediabetes - Will check at follow up  6. Smoking history - Stable  7. Psychophysiological insomnia - Stable, off meds  8.  Overweight (BMI 25.0-29.9) - Discussed importance of 150 minutes of physical activity weekly, eat two servings of fish weekly, eat one serving of tree nuts ( cashews, pistachios, pecans, almonds.Marland Kitchen) every other day, eat 6 servings of fruit/vegetables daily and drink plenty of water and avoid sweet beverages.   9. History of basal cell carcinoma - Seeing Dermatology   Face-to-face time with patient was more than 25 minutes, >50% time spent counseling and coordination of care

## 2018-02-24 ENCOUNTER — Ambulatory Visit (INDEPENDENT_AMBULATORY_CARE_PROVIDER_SITE_OTHER): Payer: PPO | Admitting: Nurse Practitioner

## 2018-02-24 ENCOUNTER — Encounter: Payer: Self-pay | Admitting: Nurse Practitioner

## 2018-02-24 VITALS — BP 122/64 | HR 71 | Temp 97.7°F | Resp 16 | Ht 70.0 in | Wt 221.3 lb

## 2018-02-24 DIAGNOSIS — I69354 Hemiplegia and hemiparesis following cerebral infarction affecting left non-dominant side: Secondary | ICD-10-CM

## 2018-02-24 DIAGNOSIS — Z5181 Encounter for therapeutic drug level monitoring: Secondary | ICD-10-CM

## 2018-02-24 DIAGNOSIS — N5203 Combined arterial insufficiency and corporo-venous occlusive erectile dysfunction: Secondary | ICD-10-CM | POA: Diagnosis not present

## 2018-02-24 DIAGNOSIS — K219 Gastro-esophageal reflux disease without esophagitis: Secondary | ICD-10-CM | POA: Diagnosis not present

## 2018-02-24 DIAGNOSIS — Z87891 Personal history of nicotine dependence: Secondary | ICD-10-CM

## 2018-02-24 DIAGNOSIS — F5104 Psychophysiologic insomnia: Secondary | ICD-10-CM

## 2018-02-24 DIAGNOSIS — R7303 Prediabetes: Secondary | ICD-10-CM

## 2018-02-24 DIAGNOSIS — I479 Paroxysmal tachycardia, unspecified: Secondary | ICD-10-CM

## 2018-02-24 DIAGNOSIS — E7800 Pure hypercholesterolemia, unspecified: Secondary | ICD-10-CM | POA: Diagnosis not present

## 2018-02-24 DIAGNOSIS — J301 Allergic rhinitis due to pollen: Secondary | ICD-10-CM

## 2018-02-24 NOTE — Patient Instructions (Signed)

## 2018-02-24 NOTE — Progress Notes (Signed)
Name: Tyrone Curtis   MRN: [id number]    DOB: 1950/05/11   Date:02/24/2018       Progress Note  Subjective  Chief Complaint  Chief Complaint  Patient presents with  . Follow-up    1 month f/u  . Labs Only    patient is fasting    HPI  Paroxysmal tachycardia: follows up with Dr. Rockey Situ; lifestyle modification- drinking lots of water, resting; hasn't had an issues since last seen cardiology.   Hyperlipidemia: takes atorvastatin 40mg  nightly without any issues. States has made some positive lifestyle changes- eating salads, and some intermittent fasting; drinking more water.  Lab Results  Component Value Date   CHOL 193 08/11/2017   HDL 47 08/11/2017   LDLCALC 118 (H) 08/11/2017   TRIG 141 08/11/2017   CHOLHDL 4.1 08/11/2017    GERD: he is taking omeprazole 40mg  daily, states when he misses a dose he can tell because acid reflux symptoms return. States was told after his stroke the left side of esophagus had some issues.   Allergic Rhinitis: loratadine 10mg  tablet takes daily and it worse well for him   History of stroke with left sided hemiparesis: takes daily baby aspirin, tight blood pressures and taking statin.  Erectile dysfunction: states still having issues; but instructed not to take medications because of stroke.   Prediabetes: diet improved, goal to lose 7 pounds from now until next spring.   Insomnia: states sleeping well; snoring states not as bad in the winter.   Depression & Anxiety: takes zoloft 50mg  daily and helps depression and aggressive.   GAD 7 : Generalized Anxiety Score 02/24/2018  Nervous, Anxious, on Edge 0  Control/stop worrying 0  Worry too much - different things 0  Trouble relaxing 0  Restless 1  Easily annoyed or irritable 1  Afraid - awful might happen 0  Total GAD 7 Score 2  Anxiety Difficulty Not difficult at all    Depression screen [facility] 2/9 02/24/2018 08/11/2017 01/20/2017 07/16/2016 01/16/2016  Decreased Interest 0 0 0 0 0  Down,  Depressed, Hopeless 0 0 0 0 0  PHQ - 2 Score 0 0 0 0 0  Altered sleeping 0 - - - -  Tired, decreased energy 0 - - - -  Change in appetite 0 - - - -  Feeling bad or failure about yourself  0 - - - -  Trouble concentrating 0 - - - -  Moving slowly or fidgety/restless 0 - - - -  Suicidal thoughts 0 - - - -  PHQ-9 Score 0 - - - -  Difficult doing work/chores Not difficult at all - - - -      Patient Active Problem List   Diagnosis Date Noted  . Prediabetes 01/02/2018  . Overweight (BMI 25.0-29.9) 01/02/2018  . History of basal cell carcinoma 08/12/2017  . Paroxysmal tachycardia ([place]) 08/23/2016  . Smoking history 08/23/2016  . Adjustment disorder with mixed anxiety and depressed mood 07/16/2016  . Elevated cholesterol 01/06/2014  . Gastroesophageal reflux disease without esophagitis 01/06/2014  . Hemiparesis affecting left side as late effect of cerebrovascular accident (CVA) ([place]) 01/06/2014  . Insomnia 07/26/2013  . Allergic rhinitis 12/16/2011  . Erectile dysfunction 12/16/2011    Past Medical History:  Diagnosis Date  . Allergic rhinitis, cause unspecified   . Allergy   . Anxiety state, unspecified   . Arthritis   . Basal cell carcinoma    facial  . Cataract   . Chicken pox   .  CVA (cerebral vascular accident) ([place])   . Depression   . 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 Dysphagia, unspecified(787.20)   . Erectile dysfunction   . Herpes zoster without mention of complication   . Internal hemorrhoids without mention of complication   . Measles   . Mumps    x 2  . Personal history of colonic polyps   . Pure hypercholesterolemia   . Regurgitation     Past Surgical History:  Procedure Laterality Date  . basal cell carcinoma of anterior chest  2000   resection; [facility]  . cataract surgery  2010   with lens repacement Left  . COLONOSCOPY  09/23/12   normal.  Symptoms: anemia, hemoccult+.  [facility].  .  ESOPHAGOGASTRODUODENOSCOPY  09/23/12   normal.  Symptoms: anemia, hemoccult +, GERD.  [facility] EYE SURGERY Bilateral    cataract  . FRACTURE SURGERY Left    1957 left arm  . small bowel mass  1991   removed part of small intestine benign mass  . SMALL INTESTINE SURGERY      Social History   Tobacco Use  . Smoking status: Former Smoker    Packs/day: 2.00    Years: 25.00    Pack years: 50.00    Types: Cigarettes  . Smokeless tobacco: Never Used  . Tobacco comment: quit in 1984  Substance Use Topics  . Alcohol use: Yes    Alcohol/week: 6.0 standard drinks    Types: 6 Cans of beer per week    Comment: 6 drinks/week     Current Outpatient Medications:  .  ASPIRIN 81 PO, Take 81 mg by mouth 2 (two) times daily., Disp: , Rfl:  .  atorvastatin (LIPITOR) 40 MG tablet, TAKE 1 TABLET(40 MG) BY MOUTH DAILY, Disp: 90 tablet, Rfl: 3 .  fluorouracil (EFUDEX) 5 % cream, Apply topically 2 (two) times daily., Disp: , Rfl:  .  loratadine (CLARITIN) 10 MG tablet, Take 10 mg by mouth daily., Disp: , Rfl:  .  Multiple Vitamin (MULTIVITAMINS PO), Take by mouth daily., Disp: , Rfl:  .  omeprazole (PRILOSEC) 40 MG capsule, TAKE 1 CAPSULE(40 MG) BY MOUTH DAILY, Disp: 90 capsule, Rfl: 3 .  psyllium (METAMUCIL) 58.6 % powder, Take 1 packet by mouth 3 (three) times daily., Disp: , Rfl:  .  sertraline (ZOLOFT) 50 MG tablet, TAKE 1 TABLET(50 MG) BY MOUTH DAILY, Disp: 90 tablet, Rfl: 3  Allergies  Allergen Reactions  . Zyrtec D [Cetirizine-Pseudoephedrine Er]     "talking out of his head"    ROS  No other specific complaints in a complete review of systems (except as listed in HPI above).  Objective  Vitals:   02/24/18 1328  BP: 122/64  Pulse: 71  Resp: 16  Temp: 97.7 F (36.5 C)  TempSrc: Oral  SpO2: 98%  Weight: 221 lb 4.8 oz (100.4 kg)  Height: 5\' 10"  (1.778 m)    Body mass index is 31.75 kg/m.  Nursing Note and Vital Signs reviewed.  Physical Exam Vitals signs reviewed.   Constitutional:      Appearance: He is well-developed.  HENT:     Head: Normocephalic and atraumatic.     Mouth/Throat:     Mouth: Mucous membranes are moist.     Pharynx: No posterior oropharyngeal erythema.  Eyes:     Conjunctiva/sclera: Conjunctivae normal.     Pupils: Pupils are equal, round, and reactive to light.  Neck:     Musculoskeletal: Normal  range of motion and neck supple.     Vascular: No carotid bruit.  Cardiovascular:     Heart sounds: Normal heart sounds.  Pulmonary:     Effort: Pulmonary effort is normal.     Breath sounds: Normal breath sounds.  Abdominal:     General: Bowel sounds are normal.     Palpations: Abdomen is soft.     Tenderness: There is no abdominal tenderness.  Musculoskeletal: Normal range of motion.  Skin:    General: Skin is warm and dry.     Capillary Refill: Capillary refill takes less than 2 seconds.  Neurological:     Mental Status: He is alert and oriented to person, place, and time.     GCS: GCS eye subscore is 4. GCS verbal subscore is 5. GCS motor subscore is 6.     Sensory: No sensory deficit.  Psychiatric:        Speech: Speech normal.        Behavior: Behavior normal.        Thought Content: Thought content normal.        Judgment: Judgment normal.      No results found for this or any previous visit (from the past 48 hour(s)).  Assessment & Plan  1. Hemiparesis affecting left side as late effect of cerebrovascular accident (CVA) ([place]) Stable follow up with neurology.  - CBC with Differential - COMPLETE METABOLIC PANEL WITH GFR  2. Elevated cholesterol Continue diet, weight loss  - Lipid Profile  3. Gastroesophageal reflux disease without esophagitis Stable continue meds - CBC with Differential  4. Prediabetes Improving, continue diet and exercise  - COMPLETE METABOLIC PANEL WITH GFR - HgB A1c  5. Smoking history quit - CBC with Differential  6. Psychophysiological insomnia resolved  7. Seasonal  allergic rhinitis due to pollen Controlled continue loratadine   8. Paroxysmal tachycardia (H[facility]) resolved - CBC with Differential - COMPLETE METABOLIC PANEL WITH GFR  9. Combined arterial insufficiency and corporo-venous occlusive erectile dysfunction Offered urology follow up declined  10. Medication monitoring encounter - CBC with Differential - COMPLETE METABOLIC PANEL WITH GFR

## 2018-02-25 ENCOUNTER — Other Ambulatory Visit: Payer: Self-pay | Admitting: Nurse Practitioner

## 2018-02-25 LAB — CBC WITH DIFFERENTIAL/PLATELET
ABSOLUTE MONOCYTES: 591 {cells}/uL (ref 200–950)
Basophils Absolute: 29 cells/uL (ref 0–200)
Basophils Relative: 0.4 %
EOS ABS: 73 {cells}/uL (ref 15–500)
Eosinophils Relative: 1 %
HCT: 45.4 % (ref 38.5–50.0)
HEMOGLOBIN: 15.8 g/dL (ref 13.2–17.1)
Lymphs Abs: 2278 cells/uL (ref 850–3900)
MCH: 31.5 pg (ref 27.0–33.0)
MCHC: 34.8 g/dL (ref 32.0–36.0)
MCV: 90.6 fL (ref 80.0–100.0)
MONOS PCT: 8.1 %
MPV: 11.3 fL (ref 7.5–12.5)
NEUTROS ABS: 4329 {cells}/uL (ref 1500–7800)
Neutrophils Relative %: 59.3 %
Platelets: 240 10*3/uL (ref 140–400)
RBC: 5.01 10*6/uL (ref 4.20–5.80)
RDW: 11.8 % (ref 11.0–15.0)
Total Lymphocyte: 31.2 %
WBC: 7.3 10*3/uL (ref 3.8–10.8)

## 2018-02-25 LAB — LIPID PANEL
Cholesterol: 183 mg/dL (ref <200)
HDL: 39 mg/dL — AB (ref >40)
LDL Cholesterol (Calc): 113 mg/dL (calc) — ABNORMAL HIGH
NON-HDL CHOLESTEROL (CALC): 144 mg/dL — AB (ref <130)
Total CHOL/HDL Ratio: 4.7 (calc) (ref <5.0)
Triglycerides: 193 mg/dL — ABNORMAL HIGH (ref <150)

## 2018-02-25 LAB — COMPLETE METABOLIC PANEL WITH GFR
AG Ratio: 1.8 (calc) (ref 1.0–2.5)
ALKALINE PHOSPHATASE (APISO): 75 U/L (ref 40–115)
ALT: 16 U/L (ref 9–46)
AST: 19 U/L (ref 10–35)
Albumin: 4.5 g/dL (ref 3.6–5.1)
BUN: 12 mg/dL (ref 7–25)
CO2: 27 mmol/L (ref 20–32)
Calcium: 9.8 mg/dL (ref 8.6–10.3)
Chloride: 103 mmol/L (ref 98–110)
Creat: 1.1 mg/dL (ref 0.70–1.25)
GFR, Est African American: 79 mL/min/{1.73_m2} (ref >=60)
GFR, Est Non African American: 68 mL/min/{1.73_m2} (ref >=60)
GLOBULIN: 2.5 g/dL (ref 1.9–3.7)
Glucose, Bld: 87 mg/dL (ref 65–99)
POTASSIUM: 4.5 mmol/L (ref 3.5–5.3)
SODIUM: 139 mmol/L (ref 135–146)
Total Bilirubin: 0.7 mg/dL (ref 0.2–1.2)
Total Protein: 7 g/dL (ref 6.1–8.1)

## 2018-02-25 LAB — HEMOGLOBIN A1C
HEMOGLOBIN A1C: 5.7 %{Hb} — AB (ref <5.7)
Mean Plasma Glucose: 117 (calc)
eAG (mmol/L): 6.5 (calc)

## 2018-02-25 MED ORDER — ATORVASTATIN CALCIUM 80 MG PO TABS: ORAL_TABLET | ORAL | 1 refills | Status: DC

## 2018-04-27 ENCOUNTER — Encounter: Payer: Self-pay | Admitting: Family Medicine

## 2018-04-27 ENCOUNTER — Ambulatory Visit (INDEPENDENT_AMBULATORY_CARE_PROVIDER_SITE_OTHER): Payer: PPO | Admitting: Family Medicine

## 2018-04-27 VITALS — BP 138/84 | HR 78 | Temp 98.6°F | Resp 16 | Ht 70.0 in | Wt 216.8 lb

## 2018-04-27 DIAGNOSIS — E7800 Pure hypercholesterolemia, unspecified: Secondary | ICD-10-CM

## 2018-04-27 DIAGNOSIS — Z6379 Other stressful life events affecting family and household: Secondary | ICD-10-CM | POA: Diagnosis not present

## 2018-04-27 LAB — LIPID PANEL
CHOL/HDL RATIO: 4.2 (calc) (ref <5.0)
Cholesterol: 169 mg/dL (ref <200)
HDL: 40 mg/dL (ref >=40)
LDL CHOLESTEROL (CALC): 101 mg/dL — AB
Non-HDL Cholesterol (Calc): 129 mg/dL (calc) (ref <130)
Triglycerides: 184 mg/dL — ABNORMAL HIGH (ref <150)

## 2018-04-27 NOTE — Patient Instructions (Addendum)
Your LDL is above normal.  The LDL is the "lousy" or bad cholesterol. Over time and in combination with inflammation and other factors, this contributes to plaque which in turn may lead to stroke and/or heart attack down the road.  Sometimes high LDL is primarily genetic, and people might be eating all the right foods but still have high numbers.  Other times, there is room for improvement in one's diet and eating healthier can bring this number down and potentially reduce one's risk of heart attack and/or stroke.  Your LDL level should be below 100. If you have diabetes or a possible heart problem, your LDL should be below 70.  Some strategies to focus on to help improve your LDL levels:  - Eat 20 to 30 grams of fiber every day.  - Eat Foods such as fruits and vegetables, whole grains, beans, peas, nuts, and seeds can help lower LDL. - Avoid Saturated fats - Dairy foods - such as butter, cream, ghee, regular-fat milk and cheese. Meat - such as fatty cuts of beef, pork and lamb, processed meats like salami, sausages and the skin on chicken. Lard., fatty snack foods, cakes, biscuits, pies and deep fried foods) - Avoid smoking   Fat and Cholesterol Restricted Eating Plan Getting too much fat and cholesterol in your diet may cause health problems. Choosing the right foods helps keep your fat and cholesterol at normal levels. This can keep you from getting certain diseases. Your doctor may recommend an eating plan that includes:  Total fat: ______% or less of total calories a day.  Saturated fat: ______% or less of total calories a day.  Cholesterol: less than _________mg a day.  Fiber: ______g a day. What are tips for following this plan? Meal planning  At meals, divide your plate into four equal parts: ? Fill one-half of your plate with vegetables and green salads. ? Fill one-fourth of your plate with whole grains. ? Fill one-fourth of your plate with low-fat (lean) protein foods.  Eat fish  that is high in omega-3 fats at least two times a week. This includes mackerel, tuna, sardines, and salmon.  Eat foods that are high in fiber, such as whole grains, beans, apples, broccoli, carrots, peas, and barley. General tips   Work with your doctor to lose weight if you need to.  Avoid: ? Foods with added sugar. ? Fried foods. ? Foods with partially hydrogenated oils.  Limit alcohol intake to no more than 1 drink a day for nonpregnant women and 2 drinks a day for men. One drink equals 12 oz of beer, 5 oz of wine, or 1 oz of hard liquor. Reading food labels  Check food labels for: ? Trans fats. ? Partially hydrogenated oils. ? Saturated fat (g) in each serving. ? Cholesterol (mg) in each serving. ? Fiber (g) in each serving.  Choose foods with healthy fats, such as: ? Monounsaturated fats. ? Polyunsaturated fats. ? Omega-3 fats.  Choose grain products that have whole grains. Look for the word "whole" as the first word in the ingredient list. Cooking  Cook foods using low-fat methods. These include baking, boiling, grilling, and broiling.  Eat more home-cooked foods. Eat at restaurants and buffets less often.  Avoid cooking using saturated fats, such as butter, cream, palm oil, palm kernel oil, and coconut oil. Recommended foods  Fruits  All fresh, canned (in natural juice), or frozen fruits. Vegetables  Fresh or frozen vegetables (raw, steamed, roasted, or grilled). Green salads. Grains  Whole grains, such as whole wheat or whole grain breads, crackers, cereals, and pasta. Unsweetened oatmeal, bulgur, barley, quinoa, or brown rice. Corn or whole wheat flour tortillas. Meats and other protein foods  Ground beef (85% or leaner), grass-fed beef, or beef trimmed of fat. Skinless chicken or [place]. Ground chicken or [place]. Pork trimmed of fat. All fish and seafood. Egg whites. Dried beans, peas, or lentils. Unsalted nuts or seeds. Unsalted canned beans. Nut butters  without added sugar or oil. Dairy  Low-fat or nonfat dairy products, such as skim or 1% milk, 2% or reduced-fat cheeses, low-fat and fat-free ricotta or cottage cheese, or plain low-fat and nonfat yogurt. Fats and oils  Tub margarine without trans fats. Light or reduced-fat mayonnaise and salad dressings. Avocado. Olive, canola, sesame, or safflower oils. The items listed above may not be a complete list of foods and beverages you can eat. Contact a dietitian for more information. Foods to avoid Fruits  Canned fruit in heavy syrup. Fruit in cream or butter sauce. Fried fruit. Vegetables  Vegetables cooked in cheese, cream, or butter sauce. Fried vegetables. Grains  White bread. White pasta. White rice. Cornbread. Bagels, pastries, and croissants. Crackers and snack foods that contain trans fat and hydrogenated oils. Meats and other protein foods  Fatty cuts of meat. Ribs, chicken wings, bacon, sausage, bologna, salami, chitterlings, fatback, hot dogs, bratwurst, and packaged lunch meats. Liver and organ meats. Whole eggs and egg yolks. Chicken and [place] with skin. Fried meat. Dairy  Whole or 2% milk, cream, half-and-half, and cream cheese. Whole milk cheeses. Whole-fat or sweetened yogurt. Full-fat cheeses. Nondairy creamers and whipped toppings. Processed cheese, cheese spreads, and cheese curds. Beverages  Alcohol. Sugar-sweetened drinks such as sodas, lemonade, and fruit drinks. Fats and oils  Butter, stick margarine, lard, shortening, ghee, or bacon fat. Coconut, palm kernel, and palm oils. Sweets and desserts  Corn syrup, sugars, honey, and molasses. Candy. Jam and jelly. Syrup. Sweetened cereals. Cookies, pies, cakes, donuts, muffins, and ice cream. The items listed above may not be a complete list of foods and beverages you should avoid. Contact a dietitian for more information. Summary  Choosing the right foods helps keep your fat and cholesterol at normal levels. This  can keep you from getting certain diseases.  At meals, fill one-half of your plate with vegetables and green salads.  Eat high-fiber foods, like whole grains, beans, apples, carrots, peas, and barley.  Limit added sugar, saturated fats, alcohol, and fried foods. This information is not intended to replace advice given to you by your health care provider. Make sure you discuss any questions you have with your health care provider. Document Released: 08/27/2011 Document Revised: 10/29/2017 Document Reviewed: 11/12/2016 Elsevier Interactive Patient Education  2019 [employer].

## 2018-04-27 NOTE — Progress Notes (Signed)
Name: Tyrone Curtis   MRN: [id number]    DOB: March 21, 1950   Date:04/27/2018       Progress Note  Subjective  Chief Complaint  Chief Complaint  Patient presents with  . Follow-up  . Hyperlipidemia    HPI  Pt presents for 8 week follow up on hyperlipidemia. No myalgias, no chest pain, no shortness of breath.  His atorvastatin was increased to 80mg  at last visit due to elevated LDL.  He has been doing well on new dosing.  His diet was going well until about a week ago.  His wife had a total mastectomy and he is her caregiver right now.  His neighbors and family have been bringing meals, so it is harder to follow a low fat diet right now.   Family Stress: Wife with recent mastectomy, he is caring for her himself.  She did have lymph node involvement and has a drain currently. They return tomorrow for follow up.  She will then start radiation. We did discuss respite care, he declines referral at this time, but may contact us in the future.   Patient Active Problem List   Diagnosis Date Noted  . Prediabetes 01/02/2018  . Overweight (BMI 25.0-29.9) 01/02/2018  . History of basal cell carcinoma 08/12/2017  . Paroxysmal tachycardia ([place]) 08/23/2016  . Smoking history 08/23/2016  . Adjustment disorder with mixed anxiety and depressed mood 07/16/2016  . Elevated cholesterol 01/06/2014  . Gastroesophageal reflux disease without esophagitis 01/06/2014  . Hemiparesis affecting left side as late effect of cerebrovascular accident (CVA) ([place]) 01/06/2014  . Insomnia 07/26/2013  . Allergic rhinitis 12/16/2011  . Erectile dysfunction 12/16/2011    Social History   Tobacco Use  . Smoking status: Former Smoker    Packs/day: 2.00    Years: 25.00    Pack years: 50.00    Types: Cigarettes  . Smokeless tobacco: Never Used  . Tobacco comment: quit in 1984  Substance Use Topics  . Alcohol use: Yes    Alcohol/week: 6.0 standard drinks    Types: 6 Cans of beer per week    Comment: 6  drinks/week     Current Outpatient Medications:  .  ASPIRIN 81 PO, Take 81 mg by mouth 2 (two) times daily., Disp: , Rfl:  .  atorvastatin (LIPITOR) 80 MG tablet, TAKE 1 TABLET(40 MG) BY MOUTH DAILY, Disp: 90 tablet, Rfl: 1 .  loratadine (CLARITIN) 10 MG tablet, Take 10 mg by mouth daily., Disp: , Rfl:  .  Multiple Vitamin (MULTIVITAMINS PO), Take by mouth daily., Disp: , Rfl:  .  omeprazole (PRILOSEC) 40 MG capsule, TAKE 1 CAPSULE(40 MG) BY MOUTH DAILY, Disp: 90 capsule, Rfl: 3 .  psyllium (METAMUCIL) 58.6 % powder, Take 1 packet by mouth 3 (three) times daily., Disp: , Rfl:  .  fluorouracil (EFUDEX) 5 % cream, Apply topically 2 (two) times daily., Disp: , Rfl:  .  sertraline (ZOLOFT) 50 MG tablet, TAKE 1 TABLET(50 MG) BY MOUTH DAILY (Patient not taking: Reported on 04/27/2018), Disp: 90 tablet, Rfl: 3  Allergies  Allergen Reactions  . Zyrtec D [Cetirizine-Pseudoephedrine Er]     "talking out of his head"    I personally reviewed active problem list, medication list, allergies, notes from last encounter, lab results with the patient/caregiver today.  ROS  Constitutional: Negative for fever or weight change.  Respiratory: Negative for cough and shortness of breath.   Cardiovascular: Negative for chest pain or palpitations.  Gastrointestinal: Negative for abdominal pain, no  bowel changes.  Musculoskeletal: Negative for gait problem or joint swelling.  Skin: Negative for rash.  Neurological: Negative for dizziness or headache.  No other specific complaints in a complete review of systems (except as listed in HPI above).  Objective  Vitals:   04/27/18 0759  BP: 138/84  Pulse: 78  Resp: 16  Temp: 98.6 F (37 C)  TempSrc: Oral  SpO2: 96%  Weight: 216 lb 12.8 oz (98.3 kg)  Height: 5\' 10"  (1.778 m)   Body mass index is 31.11 kg/m.  Nursing Note and Vital Signs reviewed.  Physical Exam  Constitutional: Patient appears well-developed and well-nourished. No distress.    HENT: Head: Normocephalic and atraumatic.  Eyes: Conjunctivae and EOM are normal. No scleral icterus. Neck: Normal range of motion. Neck supple. No JVD present. Cardiovascular: Normal rate, regular rhythm and normal heart sounds.  No murmur heard. No BLE edema. Pulmonary/Chest: Effort normal and breath sounds normal. No respiratory distress. Musculoskeletal: Normal range of motion, no joint effusions. No gross deformities Neurological: Pt is alert and oriented to person, place, and time. No cranial nerve deficit. Coordination, balance, strength, speech and gait are normal.  Skin: Skin is warm and dry. No rash noted. No erythema.  Psychiatric: Patient has a normal mood and affect. behavior is normal. Judgment and thought content normal.  No results found for this or any previous visit (from the past 72 hour(s)).  Assessment & Plan  1. Pure hypercholesterolemia - Continue Atorvastatin, recommend low fat diet, but with current social situation, this may be difficult. - Lipid panel  2. Stress due to illness of family member Discussed respite care in detail, he feels he has enough support currently from his daughter, family, and friends, but will contact our office if needed.    -Red flags and when to present for emergency care or RTC including fever >101.34F, chest pain, shortness of breath, new/worsening/un-resolving symptoms, reviewed with patient at time of visit. Follow up and care instructions discussed and provided in AVS.

## 2018-04-28 ENCOUNTER — Encounter: Payer: Self-pay | Admitting: Family Medicine

## 2018-04-28 ENCOUNTER — Other Ambulatory Visit: Payer: Self-pay | Admitting: Family Medicine

## 2018-04-28 DIAGNOSIS — I69354 Hemiplegia and hemiparesis following cerebral infarction affecting left non-dominant side: Secondary | ICD-10-CM

## 2018-04-28 DIAGNOSIS — Z87891 Personal history of nicotine dependence: Secondary | ICD-10-CM

## 2018-04-28 DIAGNOSIS — R7303 Prediabetes: Secondary | ICD-10-CM

## 2018-04-28 DIAGNOSIS — E663 Overweight: Secondary | ICD-10-CM

## 2018-04-28 DIAGNOSIS — E782 Mixed hyperlipidemia: Secondary | ICD-10-CM

## 2018-04-28 MED ORDER — ICOSAPENT ETHYL 1 G PO CAPS: 2.0000 g | ORAL_CAPSULE | Freq: Two times a day (BID) | ORAL | 1 refills | Status: DC

## 2018-05-01 ENCOUNTER — Other Ambulatory Visit: Payer: Self-pay | Admitting: Family Medicine

## 2018-05-03 ENCOUNTER — Encounter: Payer: Self-pay | Admitting: Family Medicine

## 2018-06-03 ENCOUNTER — Encounter: Payer: Self-pay | Admitting: Family Medicine

## 2018-07-06 ENCOUNTER — Encounter: Payer: Self-pay | Admitting: Family Medicine

## 2018-07-07 ENCOUNTER — Encounter: Payer: Self-pay | Admitting: Family Medicine

## 2018-07-28 DIAGNOSIS — L570 Actinic keratosis: Secondary | ICD-10-CM | POA: Diagnosis not present

## 2018-07-28 DIAGNOSIS — L821 Other seborrheic keratosis: Secondary | ICD-10-CM | POA: Diagnosis not present

## 2018-07-28 DIAGNOSIS — Z872 Personal history of diseases of the skin and subcutaneous tissue: Secondary | ICD-10-CM | POA: Diagnosis not present

## 2018-07-28 DIAGNOSIS — Z85828 Personal history of other malignant neoplasm of skin: Secondary | ICD-10-CM | POA: Diagnosis not present

## 2018-07-28 DIAGNOSIS — Z08 Encounter for follow-up examination after completed treatment for malignant neoplasm: Secondary | ICD-10-CM | POA: Diagnosis not present

## 2018-07-28 DIAGNOSIS — X32XXXA Exposure to sunlight, initial encounter: Secondary | ICD-10-CM | POA: Diagnosis not present

## 2018-08-17 ENCOUNTER — Encounter: Payer: Self-pay | Admitting: Family Medicine

## 2018-08-17 DIAGNOSIS — Z961 Presence of intraocular lens: Secondary | ICD-10-CM | POA: Diagnosis not present

## 2018-08-17 DIAGNOSIS — H53462 Homonymous bilateral field defects, left side: Secondary | ICD-10-CM | POA: Diagnosis not present

## 2018-08-17 DIAGNOSIS — H52223 Regular astigmatism, bilateral: Secondary | ICD-10-CM | POA: Diagnosis not present

## 2018-08-17 DIAGNOSIS — H53461 Homonymous bilateral field defects, right side: Secondary | ICD-10-CM | POA: Diagnosis not present

## 2018-08-17 DIAGNOSIS — H5203 Hypermetropia, bilateral: Secondary | ICD-10-CM | POA: Diagnosis not present

## 2018-08-17 DIAGNOSIS — H43813 Vitreous degeneration, bilateral: Secondary | ICD-10-CM | POA: Diagnosis not present

## 2018-08-17 DIAGNOSIS — H524 Presbyopia: Secondary | ICD-10-CM | POA: Diagnosis not present

## 2018-08-18 ENCOUNTER — Encounter: Payer: Self-pay | Admitting: Family Medicine

## 2018-08-25 ENCOUNTER — Other Ambulatory Visit: Payer: Self-pay

## 2018-08-25 ENCOUNTER — Encounter: Payer: Self-pay | Admitting: Family Medicine

## 2018-08-25 ENCOUNTER — Ambulatory Visit (INDEPENDENT_AMBULATORY_CARE_PROVIDER_SITE_OTHER): Payer: PPO | Admitting: Family Medicine

## 2018-08-25 VITALS — BP 124/82 | HR 70 | Temp 98.3°F | Resp 14 | Ht 70.25 in | Wt 190.4 lb

## 2018-08-25 DIAGNOSIS — E782 Mixed hyperlipidemia: Secondary | ICD-10-CM | POA: Diagnosis not present

## 2018-08-25 DIAGNOSIS — Z125 Encounter for screening for malignant neoplasm of prostate: Secondary | ICD-10-CM

## 2018-08-25 DIAGNOSIS — R7303 Prediabetes: Secondary | ICD-10-CM

## 2018-08-25 DIAGNOSIS — Z87891 Personal history of nicotine dependence: Secondary | ICD-10-CM | POA: Diagnosis not present

## 2018-08-25 DIAGNOSIS — Z0000 Encounter for general adult medical examination without abnormal findings: Secondary | ICD-10-CM | POA: Diagnosis not present

## 2018-08-25 DIAGNOSIS — Z136 Encounter for screening for cardiovascular disorders: Secondary | ICD-10-CM

## 2018-08-25 DIAGNOSIS — E663 Overweight: Secondary | ICD-10-CM | POA: Diagnosis not present

## 2018-08-25 DIAGNOSIS — K219 Gastro-esophageal reflux disease without esophagitis: Secondary | ICD-10-CM | POA: Diagnosis not present

## 2018-08-25 MED ORDER — OMEPRAZOLE 40 MG PO CPDR
DELAYED_RELEASE_CAPSULE | ORAL | 3 refills | Status: DC
Start: 2018-08-25 — End: 2019-03-03

## 2018-08-25 MED ORDER — ATORVASTATIN CALCIUM 80 MG PO TABS: ORAL_TABLET | ORAL | 1 refills | Status: DC

## 2018-08-25 NOTE — Patient Instructions (Signed)
Preventive Care 2 Years and Older, Male Preventive care refers to lifestyle choices and visits with your health care provider that can promote health and wellness. What does preventive care include?   A yearly physical exam. This is also called an annual well check.  Dental exams once or twice a year.  Routine eye exams. Ask your health care provider how often you should have your eyes checked.  Personal lifestyle choices, including: ? Daily care of your teeth and gums. ? Regular physical activity. ? Eating a healthy diet. ? Avoiding tobacco and drug use. ? Limiting alcohol use. ? Practicing safe sex. ? Taking low doses of aspirin every day. ? Taking vitamin and mineral supplements as recommended by your health care provider. What happens during an annual well check? The services and screenings done by your health care provider during your annual well check will depend on your age, overall health, lifestyle risk factors, and family history of disease. Counseling Your health care provider may ask you questions about your:  Alcohol use.  Tobacco use.  Drug use.  Emotional well-being.  Home and relationship well-being.  Sexual activity.  Eating habits.  History of falls.  Memory and ability to understand (cognition).  Work and work Statistician. Screening You may have the following tests or measurements:  Height, weight, and BMI.  Blood pressure.  Lipid and cholesterol levels. These may be checked every 5 years, or more frequently if you are over 9 years old.  Skin check.  Lung cancer screening. You may have this screening every year starting at age 57 if you have a 30-pack-year history of smoking and currently smoke or have quit within the past 15 years.  Colorectal cancer screening. All adults should have this screening starting at age 90 and continuing until age 69. You will have tests every 1-10 years, depending on your results and the type of screening  test. People at increased risk should start screening at an earlier age. Screening tests may include: ? Guaiac-based fecal occult blood testing. ? Fecal immunochemical test (FIT). ? Stool DNA test. ? Virtual colonoscopy. ? Sigmoidoscopy. During this test, a flexible tube with a tiny camera (sigmoidoscope) is used to examine your rectum and lower colon. The sigmoidoscope is inserted through your anus into your rectum and lower colon. ? Colonoscopy. During this test, a long, thin, flexible tube with a tiny camera (colonoscope) is used to examine your entire colon and rectum.  Prostate cancer screening. Recommendations will vary depending on your family history and other risks.  Hepatitis C blood test.  Hepatitis B blood test.  Sexually transmitted disease (STD) testing.  Diabetes screening. This is done by checking your blood sugar (glucose) after you have not eaten for a while (fasting). You may have this done every 1-3 years.  Abdominal aortic aneurysm (AAA) screening. You may need this if you are a current or former smoker.  Osteoporosis. You may be screened starting at age 30 if you are at high risk. Talk with your health care provider about your test results, treatment options, and if necessary, the need for more tests. Vaccines Your health care provider may recommend certain vaccines, such as:  Influenza vaccine. This is recommended every year.  Tetanus, diphtheria, and acellular pertussis (Tdap, Td) vaccine. You may need a Td booster every 10 years.  Varicella vaccine. You may need this if you have not been vaccinated.  Zoster vaccine. You may need this after age 42.  Measles, mumps, and rubella (MMR) vaccine.  You may need at least one dose of MMR if you were born in 1957 or later. You may also need a second dose.  Pneumococcal 13-valent conjugate (PCV13) vaccine. One dose is recommended after age 65.  Pneumococcal polysaccharide (PPSV23) vaccine. One dose is recommended  after age 65.  Meningococcal vaccine. You may need this if you have certain conditions.  Hepatitis A vaccine. You may need this if you have certain conditions or if you travel or work in places where you may be exposed to hepatitis A.  Hepatitis B vaccine. You may need this if you have certain conditions or if you travel or work in places where you may be exposed to hepatitis B.  Haemophilus influenzae type b (Hib) vaccine. You may need this if you have certain risk factors. Talk to your health care provider about which screenings and vaccines you need and how often you need them. This information is not intended to replace advice given to you by your health care provider. Make sure you discuss any questions you have with your health care provider. Document Released: 03/24/2015 Document Revised: 04/17/2017 Document Reviewed: 12/27/2014 Elsevier Interactive Patient Education  2019 Elsevier Inc.  

## 2018-08-25 NOTE — Progress Notes (Signed)
Name: Tyrone Curtis   MRN: [id number]    DOB: 01/22/1951   Date:08/25/2018       Progress Note  Subjective  Chief Complaint  Chief Complaint  Patient presents with  . Annual Exam    HPI  Patient presents for annual CPE.  His wife had mastectomy 2 months ago, had reconstruction surgery yesterday, and starts radiation in a few weeks.  He is coping okay with this, but admits it has been quite stressful.  USPSTF grade A and B recommendations:  Diet: He has changed his diet drastically to help his triglycerides and he is congratulated on this drastic change.  We will check fasting levels tomorrow morning. Exercise: He has been walking at least a mile a day  Depression: phq 9 is negative Depression screen [facility] 2/9 04/27/2018 02/24/2018 08/11/2017 01/20/2017 07/16/2016  Decreased Interest 0 0 0 0 0  Down, Depressed, Hopeless 0 0 0 0 0  PHQ - 2 Score 0 0 0 0 0  Altered sleeping 0 0 - - -  Tired, decreased energy 0 0 - - -  Change in appetite 0 0 - - -  Feeling bad or failure about yourself  0 0 - - -  Trouble concentrating 0 0 - - -  Moving slowly or fidgety/restless 0 0 - - -  Suicidal thoughts 0 0 - - -  PHQ-9 Score 0 0 - - -  Difficult doing work/chores Not difficult at all Not difficult at all - - -    Hypertension:  BP Readings from Last 3 Encounters:  08/25/18 124/82  04/27/18 138/84  02/24/18 122/64    Obesity: Wt Readings from Last 3 Encounters:  08/25/18 190 lb 6.4 oz (86.4 kg)  04/27/18 216 lb 12.8 oz (98.3 kg)  02/24/18 221 lb 4.8 oz (100.4 kg)   BMI Readings from Last 3 Encounters:  08/25/18 27.13 kg/m  04/27/18 31.11 kg/m  02/24/18 31.75 kg/m   Lipids: Taking atorvastatin and doing well on this medication - he has lost a significant amount of weight- 26lbs - and has changed his diet drastically - he is congratulated on this.  Taking ASA daily.  Lab Results  Component Value Date   CHOL 169 04/27/2018   CHOL 183 02/24/2018   CHOL 193 08/11/2017   Lab  Results  Component Value Date   HDL 40 04/27/2018   HDL 39 (L) 02/24/2018   HDL 47 08/11/2017   Lab Results  Component Value Date   LDLCALC 101 (H) 04/27/2018   LDLCALC 113 (H) 02/24/2018   LDLCALC 118 (H) 08/11/2017   Lab Results  Component Value Date   TRIG 184 (H) 04/27/2018   TRIG 193 (H) 02/24/2018   TRIG 141 08/11/2017   Lab Results  Component Value Date   CHOLHDL 4.2 04/27/2018   CHOLHDL 4.7 02/24/2018   CHOLHDL 4.1 08/11/2017   No results found for: LDLDIRECT Glucose:  Glucose  Date Value Ref Range Status  08/11/2017 98 65 - 99 mg/dL Final  01/20/2017 99 65 - 99 mg/dL Final  07/16/2016 109 (H) 65 - 99 mg/dL Final   Glucose, Bld  Date Value Ref Range Status  02/24/2018 87 65 - 99 mg/dL Final    Comment:    .            Fasting reference interval .   01/16/2016 108 (H) 65 - 99 mg/dL Final  08/29/2015 82 65 - 99 mg/dL Final      Office Visit from  04/27/2018 in [facility]  AUDIT-C Score  0     Married - no new partners STD testing and prevention (HIV/chl/gon/syphilis):  Declines Hep C: Negative in 2016  Skin cancer: No concerning lesions at this time, but is seeing dermatologist every 6 months. (Dr. Evorn Gong) Colorectal cancer: UTD - due in 2024. Prostate cancer: We will check today Lab Results  Component Value Date   PSA 1.29 08/29/2015   PSA 1.03 08/01/2014   PSA 1.05 07/26/2013    IPSS Questionnaire (AUA-7): Over the past month.   1)  How often have you had a sensation of not emptying your bladder completely after you finish urinating?  1 - Less than 1 time in 5  2)  How often have you had to urinate again less than two hours after you finished urinating? 0 - Not at all  3)  How often have you found you stopped and started again several times when you urinated?  0 - Not at all  4) How difficult have you found it to postpone urination?  0 - Not at all  5) How often have you had a weak urinary stream?  0 - Not at all  6) How  often have you had to push or strain to begin urination?  0 - Not at all  7) How many times did you most typically get up to urinate from the time you went to bed until the time you got up in the morning?  1 - 1 time  Total score:  0-7 mildly symptomatic   8-19 moderately symptomatic   20-35 severely symptomatic  Score of 2 - very mild.  Lung cancer: Quit in 1981, smoked about 1ppd x15 years. Low Dose CT Chest recommended if Age 8-80 years, 30 pack-year currently smoking OR have quit w/in 15years. Patient does not qualify.   AAA: Does qualify - will order today. The USPSTF recommends one-time screening with ultrasonography in men ages 70 to 70 years who have ever smoked ECG:  Denies chest pain, shortness of breath, or palpitations.   Advanced Care Planning: A voluntary discussion about advance care planning including the explanation and discussion of advance directives.  Discussed health care proxy and Living will, and the patient was able to identify a health care proxy as wife - Sami Froh.  Patient does have a living will at present time. If patient does have living will, I have requested they bring this to the clinic to be scanned in to their chart.  Patient Active Problem List   Diagnosis Date Noted  . Prediabetes 01/02/2018  . Overweight (BMI 25.0-29.9) 01/02/2018  . History of basal cell carcinoma 08/12/2017  . Paroxysmal tachycardia ([place]) 08/23/2016  . Smoking history 08/23/2016  . Adjustment disorder with mixed anxiety and depressed mood 07/16/2016  . Hyperlipidemia 01/06/2014  . Gastroesophageal reflux disease without esophagitis 01/06/2014  . Hemiparesis affecting left side as late effect of cerebrovascular accident (CVA) ([place]) 01/06/2014  . Insomnia 07/26/2013  . Allergic rhinitis 12/16/2011  . Erectile dysfunction 12/16/2011    Past Surgical History:  Procedure Laterality Date  . basal cell carcinoma of anterior chest  2000   resection; [facility]  . cataract surgery   2010   with lens repacement Left  . COLONOSCOPY  09/23/12   normal.  Symptoms: anemia, hemoccult+.  [facility].  . ESOPHAGOGASTRODUODENOSCOPY  09/23/12   normal.  Symptoms: anemia, hemoccult +, GERD.  Marland Kitchen EYE SURGERY Bilateral    cataract  . FRACTURE  SURGERY Left    1957 left arm  . small bowel mass  1991   removed part of small intestine benign mass  . SMALL INTESTINE SURGERY      Family History  Problem Relation Age of Onset  . Hyperlipidemia Sister   . Mental illness Sister        anxiety  . Liver disease Sister   . Hyperlipidemia Brother   . Diabetes Brother   . Arthritis Brother        Knee replacement  . Bipolar disorder Brother   . Liver disease Brother   . Heart disease Brother 46       AMI/CAD with stenting  . Diabetes Mother   . Depression Mother   . Esophageal varices Mother   . Hyperlipidemia Mother   . Mental illness Mother   . Liver disease Mother   . Cancer Father        unknown    Social History   Socioeconomic History  . Marital status: Married    Spouse name: [name]  . Number of children: 1  . Years of education: Not on file  . Highest education level: Not on file  Occupational History  . Occupation: Disability    Comment: CVA  . Occupation: Previous Writer  Social Needs  . Financial resource strain: Not hard at all  . Food insecurity    Worry: Patient refused    Inability: Patient refused  . Transportation needs    Medical: No    Non-medical: No  Tobacco Use  . Smoking status: Former Smoker    Packs/day: 2.00    Years: 25.00    Pack years: 50.00    Types: Cigarettes  . Smokeless tobacco: Never Used  . Tobacco comment: quit in 1984  Substance and Sexual Activity  . Alcohol use: Yes    Alcohol/week: 6.0 standard drinks    Types: 6 Cans of beer per week    Comment: 6 drinks/week  . Drug use: No  . Sexual activity: Yes    Birth control/protection: Post-menopausal  Lifestyle  . Physical activity    Days per week:  7 days    Minutes per session: 80 min  . Stress: To some extent  Relationships  . Social connections    Talks on phone: More than three times a week    Gets together: Three times a week    Attends religious service: Patient refused    Active member of club or organization: No    Attends meetings of clubs or organizations: Never    Relationship status: Married  . Intimate partner violence    Fear of current or ex partner: No    Emotionally abused: No    Physically abused: No    Forced sexual activity: No  Other Topics Concern  . Not on file  Social History Narrative   Marital status:  Married x 43 years      Children: 1 daughte (45)r; 1 grandson (25)      Lives: with wife; has mountain house and [place] house [place] [employer]      Employment: disabled since CVA      Tobacco: none      Alcohol:  1-2 drinks per night      Drugs:  None      Exercise: very active in the yard. No formal exercise program in 2019.      ADLs: independent with ADLs; drives locally; maintains own yard.  Advanced Directives:  NONE in 2019.  FULL CODE; no prolonged measures.          Current Outpatient Medications:  .  ASPIRIN 81 PO, Take 81 mg by mouth 2 (two) times daily., Disp: , Rfl:  .  atorvastatin (LIPITOR) 80 MG tablet, TAKE 1 TABLET(40 MG) BY MOUTH DAILY, Disp: 90 tablet, Rfl: 1 .  fluorouracil (EFUDEX) 5 % cream, Apply topically 2 (two) times daily., Disp: , Rfl:  .  loratadine (CLARITIN) 10 MG tablet, Take 10 mg by mouth daily., Disp: , Rfl:  .  Multiple Vitamin (MULTIVITAMINS PO), Take by mouth daily., Disp: , Rfl:  .  omeprazole (PRILOSEC) 40 MG capsule, TAKE 1 CAPSULE(40 MG) BY MOUTH DAILY, Disp: 90 capsule, Rfl: 3 .  psyllium (METAMUCIL) 58.6 % powder, Take 1 packet by mouth 3 (three) times daily., Disp: , Rfl:  .  sertraline (ZOLOFT) 50 MG tablet, TAKE 1 TABLET(50 MG) BY MOUTH DAILY (Patient not taking: Reported on 04/27/2018), Disp: 90 tablet, Rfl: 3  Allergies  Allergen Reactions   . Zyrtec D [Cetirizine-Pseudoephedrine Er]     "talking out of his head"     ROS  Constitutional: Negative for fever or weight change.  Respiratory: Negative for cough and shortness of breath.   Cardiovascular: Negative for chest pain or palpitations.  Gastrointestinal: Negative for abdominal pain, no bowel changes.  Musculoskeletal: Negative for gait problem or joint swelling.  Skin: Negative for rash.  Neurological: Negative for dizziness or headache.  No other specific complaints in a complete review of systems (except as listed in HPI above).   Objective  Vitals:   08/25/18 1440  BP: 124/82  Pulse: 70  Resp: 14  Temp: 98.3 F (36.8 C)  TempSrc: Oral  SpO2: 99%  Weight: 190 lb 6.4 oz (86.4 kg)  Height: 5' 10.25" (1.784 m)    Body mass index is 27.13 kg/m.  Physical Exam Constitutional: Patient appears well-developed and well-nourished. No distress.  HENT: Head: Normocephalic and atraumatic. Ears: B TMs ok, no erythema or effusion; Nose: Nose normal. Mouth/Throat: Oropharynx is clear and moist. No oropharyngeal exudate.  Eyes: Conjunctivae and EOM are normal. Pupils are equal, round, and reactive to light. No scleral icterus.  Neck: Normal range of motion. Neck supple. No JVD present. No thyromegaly present.  Cardiovascular: Normal rate, regular rhythm and normal heart sounds.  No murmur heard. No BLE edema. Pulmonary/Chest: Effort normal and breath sounds normal. No respiratory distress. Abdominal: Soft. Bowel sounds are normal, no distension. There is no tenderness. no masses MALE GENITALIA: Normal descended testes bilaterally, no masses palpated, no hernias, no lesions, no discharge RECTAL: Prostate normal size and consistency, no rectal masses or hemorrhoids Musculoskeletal: Normal range of motion, no joint effusions. No gross deformities Neurological: he is alert and oriented to person, place, and time. No cranial nerve deficit. Coordination, balance, strength,  speech and gait are normal.  Skin: Skin is warm and dry. No rash noted. No erythema.  Psychiatric: Patient has a normal mood and affect. behavior is normal. Judgment and thought content normal.  No results found for this or any previous visit (from the past 2160 hour(s)).   PHQ2/9: Depression screen [facility] 2/9 04/27/2018 02/24/2018 08/11/2017 01/20/2017 07/16/2016  Decreased Interest 0 0 0 0 0  Down, Depressed, Hopeless 0 0 0 0 0  PHQ - 2 Score 0 0 0 0 0  Altered sleeping 0 0 - - -  Tired, decreased energy 0 0 - - -  Change in  appetite 0 0 - - -  Feeling bad or failure about yourself  0 0 - - -  Trouble concentrating 0 0 - - -  Moving slowly or fidgety/restless 0 0 - - -  Suicidal thoughts 0 0 - - -  PHQ-9 Score 0 0 - - -  Difficult doing work/chores Not difficult at all Not difficult at all - - -   Fall Risk: Fall Risk  04/27/2018 02/24/2018 08/11/2017 01/20/2017 07/16/2016  Falls in the past year? 0 0 No Yes No  Comment - - - - -  Number falls in past yr: 0 0 - 1 -  Injury with Fall? 0 0 - No -  Comment - - - - -  Follow up Falls evaluation completed - - - -     Assessment & Plan  1. Encounter for annual physical exam -Prostate cancer screening and PSA options (with potential risks and benefits of testing vs not testing) were discussed along with recent recs/guidelines. -USPSTF grade A and B recommendations reviewed with patient; age-appropriate recommendations, preventive care, screening tests, etc discussed and encouraged; healthy living encouraged; see AVS for patient education given to patient -Discussed importance of 150 minutes of physical activity weekly, eat two servings of fish weekly, eat one serving of tree nuts ( cashews, pistachios, pecans, almonds.Marland Kitchen) every other day, eat 6 servings of fruit/vegetables daily and drink plenty of water and avoid sweet beverages.  - COMPLETE METABOLIC PANEL WITH GFR - Lipid panel - Hemoglobin A1c - PSA  2. Overweight (BMI 25.0-29.9) -  Congratulated on his drastic weight loss - he is doing great.  3. Prediabetes - COMPLETE METABOLIC PANEL WITH GFR - Hemoglobin A1c  4. Mixed hyperlipidemia - Lipid panel - atorvastatin (LIPITOR) 80 MG tablet; TAKE 1 TABLET(40 MG) BY MOUTH DAILY  Dispense: 90 tablet; Refill: 1  5. Smoking history - US AORTA DUPLEX LIMITED; Future  6. Prostate cancer screening - PSA  7. Screening for AAA (abdominal aortic aneurysm) - US AORTA DUPLEX LIMITED; Future  8. Gastroesophageal reflux disease without esophagitis - omeprazole (PRILOSEC) 40 MG capsule; TAKE 1 CAPSULE(40 MG) BY MOUTH DAILY  Dispense: 90 capsule; Refill: 3

## 2018-08-26 ENCOUNTER — Other Ambulatory Visit: Payer: Self-pay

## 2018-08-26 ENCOUNTER — Encounter: Payer: Self-pay | Admitting: Family Medicine

## 2018-08-26 DIAGNOSIS — Z125 Encounter for screening for malignant neoplasm of prostate: Secondary | ICD-10-CM | POA: Diagnosis not present

## 2018-08-26 DIAGNOSIS — E782 Mixed hyperlipidemia: Secondary | ICD-10-CM | POA: Diagnosis not present

## 2018-08-26 DIAGNOSIS — R7303 Prediabetes: Secondary | ICD-10-CM | POA: Diagnosis not present

## 2018-08-26 DIAGNOSIS — Z0000 Encounter for general adult medical examination without abnormal findings: Secondary | ICD-10-CM | POA: Diagnosis not present

## 2018-08-26 MED ORDER — SERTRALINE HCL 50 MG PO TABS: ORAL_TABLET | ORAL | 3 refills | Status: DC

## 2018-08-27 LAB — LIPID PANEL
Cholesterol: 160 mg/dL (ref <200)
HDL: 46 mg/dL (ref >=40)
LDL Cholesterol (Calc): 97 mg/dL (calc)
Non-HDL Cholesterol (Calc): 114 mg/dL (calc) (ref <130)
Total CHOL/HDL Ratio: 3.5 (calc) (ref <5.0)
Triglycerides: 81 mg/dL (ref <150)

## 2018-08-27 LAB — COMPLETE METABOLIC PANEL WITH GFR
AG Ratio: 1.7 (calc) (ref 1.0–2.5)
ALT: 21 U/L (ref 9–46)
AST: 24 U/L (ref 10–35)
Albumin: 4.5 g/dL (ref 3.6–5.1)
Alkaline phosphatase (APISO): 69 U/L (ref 35–144)
BUN: 11 mg/dL (ref 7–25)
CO2: 29 mmol/L (ref 20–32)
Calcium: 10 mg/dL (ref 8.6–10.3)
Chloride: 104 mmol/L (ref 98–110)
Creat: 0.94 mg/dL (ref 0.70–1.18)
GFR, Est African American: 95 mL/min/{1.73_m2} (ref >=60)
GFR, Est Non African American: 82 mL/min/{1.73_m2} (ref >=60)
Globulin: 2.7 g/dL (calc) (ref 1.9–3.7)
Glucose, Bld: 102 mg/dL — ABNORMAL HIGH (ref 65–99)
Potassium: 4.4 mmol/L (ref 3.5–5.3)
Sodium: 140 mmol/L (ref 135–146)
Total Bilirubin: 0.7 mg/dL (ref 0.2–1.2)
Total Protein: 7.2 g/dL (ref 6.1–8.1)

## 2018-08-27 LAB — PSA: PSA: 0.9 ng/mL (ref <=4.0)

## 2018-08-27 LAB — HEMOGLOBIN A1C
Hgb A1c MFr Bld: 5.5 % of total Hgb (ref <5.7)
Mean Plasma Glucose: 111 (calc)
eAG (mmol/L): 6.2 (calc)

## 2018-08-29 ENCOUNTER — Encounter: Payer: Self-pay | Admitting: Family Medicine

## 2018-09-15 ENCOUNTER — Encounter: Payer: Self-pay | Admitting: Family Medicine

## 2018-10-18 ENCOUNTER — Encounter: Payer: Self-pay | Admitting: Family Medicine

## 2018-10-20 ENCOUNTER — Encounter: Payer: Self-pay | Admitting: Family Medicine

## 2018-12-08 ENCOUNTER — Ambulatory Visit (INDEPENDENT_AMBULATORY_CARE_PROVIDER_SITE_OTHER): Payer: PPO

## 2018-12-08 ENCOUNTER — Other Ambulatory Visit: Payer: Self-pay

## 2018-12-08 DIAGNOSIS — Z23 Encounter for immunization: Secondary | ICD-10-CM | POA: Diagnosis not present

## 2018-12-17 ENCOUNTER — Ambulatory Visit (INDEPENDENT_AMBULATORY_CARE_PROVIDER_SITE_OTHER): Payer: PPO

## 2018-12-17 ENCOUNTER — Other Ambulatory Visit: Payer: Self-pay

## 2018-12-17 VITALS — Ht 70.0 in | Wt 193.0 lb

## 2018-12-17 DIAGNOSIS — Z0000 Encounter for general adult medical examination without abnormal findings: Secondary | ICD-10-CM | POA: Diagnosis not present

## 2018-12-17 NOTE — Patient Instructions (Signed)
Tyrone Curtis , Thank you for taking time to come for your Medicare Wellness Visit. I appreciate your ongoing commitment to your health goals. Please review the following plan we discussed and let me know if I can assist you in the future.   Screening recommendations/referrals: Colonoscopy: done 09/24/12 Recommended yearly ophthalmology/optometry visit for glaucoma screening and checkup Recommended yearly dental visit for hygiene and checkup  Vaccinations: Influenza vaccine: done 12/08/18 Pneumococcal vaccine: done 07/16/16 Tdap vaccine: done 04/30/11 Shingles vaccine: Shingrix discussed. Please contact your pharmacy for coverage information.   Advanced directives: Please bring a copy of your health care power of attorney and living will to the office at your convenience.  Conditions/risks identified: Keep up the great work!  Next appointment: Please follow up in one year for your Medicare Annual Wellness visit.    Preventive Care 44 Years and Older, Male Preventive care refers to lifestyle choices and visits with your health care provider that can promote health and wellness. What does preventive care include?  A yearly physical exam. This is also called an annual well check.  Dental exams once or twice a year.  Routine eye exams. Ask your health care provider how often you should have your eyes checked.  Personal lifestyle choices, including:  Daily care of your teeth and gums.  Regular physical activity.  Eating a healthy diet.  Avoiding tobacco and drug use.  Limiting alcohol use.  Practicing safe sex.  Taking low doses of aspirin every day.  Taking vitamin and mineral supplements as recommended by your health care provider. What happens during an annual well check? The services and screenings done by your health care provider during your annual well check will depend on your age, overall health, lifestyle risk factors, and family history of disease. Counseling  Your  health care provider may ask you questions about your:  Alcohol use.  Tobacco use.  Drug use.  Emotional well-being.  Home and relationship well-being.  Sexual activity.  Eating habits.  History of falls.  Memory and ability to understand (cognition).  Work and work Statistician. Screening  You may have the following tests or measurements:  Height, weight, and BMI.  Blood pressure.  Lipid and cholesterol levels. These may be checked every 5 years, or more frequently if you are over 68 years old.  Skin check.  Lung cancer screening. You may have this screening every year starting at age 48 if you have a 30-pack-year history of smoking and currently smoke or have quit within the past 15 years.  Fecal occult blood test (FOBT) of the stool. You may have this test every year starting at age 57.  Flexible sigmoidoscopy or colonoscopy. You may have a sigmoidoscopy every 5 years or a colonoscopy every 10 years starting at age 20.  Prostate cancer screening. Recommendations will vary depending on your family history and other risks.  Hepatitis C blood test.  Hepatitis B blood test.  Sexually transmitted disease (STD) testing.  Diabetes screening. This is done by checking your blood sugar (glucose) after you have not eaten for a while (fasting). You may have this done every 1-3 years.  Abdominal aortic aneurysm (AAA) screening. You may need this if you are a current or former smoker.  Osteoporosis. You may be screened starting at age 26 if you are at high risk. Talk with your health care provider about your test results, treatment options, and if necessary, the need for more tests. Vaccines  Your health care provider may recommend certain  vaccines, such as:  Influenza vaccine. This is recommended every year.  Tetanus, diphtheria, and acellular pertussis (Tdap, Td) vaccine. You may need a Td booster every 10 years.  Zoster vaccine. You may need this after age 73.   Pneumococcal 13-valent conjugate (PCV13) vaccine. One dose is recommended after age 9.  Pneumococcal polysaccharide (PPSV23) vaccine. One dose is recommended after age 75. Talk to your health care provider about which screenings and vaccines you need and how often you need them. This information is not intended to replace advice given to you by your health care provider. Make sure you discuss any questions you have with your health care provider. Document Released: 03/24/2015 Document Revised: 11/15/2015 Document Reviewed: 12/27/2014 Elsevier Interactive Patient Education  2017 [employer] Prevention in the Home Falls can cause injuries. They can happen to people of all ages. There are many things you can do to make your home safe and to help prevent falls. What can I do on the outside of my home?  Regularly fix the edges of walkways and driveways and fix any cracks.  Remove anything that might make you trip as you walk through a door, such as a raised step or threshold.  Trim any bushes or trees on the path to your home.  Use bright outdoor lighting.  Clear any walking paths of anything that might make someone trip, such as rocks or tools.  Regularly check to see if handrails are loose or broken. Make sure that both sides of any steps have handrails.  Any raised decks and porches should have guardrails on the edges.  Have any leaves, snow, or ice cleared regularly.  Use sand or salt on walking paths during winter.  Clean up any spills in your garage right away. This includes oil or grease spills. What can I do in the bathroom?  Use night lights.  Install grab bars by the toilet and in the tub and shower. Do not use towel bars as grab bars.  Use non-skid mats or decals in the tub or shower.  If you need to sit down in the shower, use a plastic, non-slip stool.  Keep the floor dry. Clean up any water that spills on the floor as soon as it happens.  Remove soap  buildup in the tub or shower regularly.  Attach bath mats securely with double-sided non-slip rug tape.  Do not have throw rugs and other things on the floor that can make you trip. What can I do in the bedroom?  Use night lights.  Make sure that you have a light by your bed that is easy to reach.  Do not use any sheets or blankets that are too big for your bed. They should not hang down onto the floor.  Have a firm chair that has side arms. You can use this for support while you get dressed.  Do not have throw rugs and other things on the floor that can make you trip. What can I do in the kitchen?  Clean up any spills right away.  Avoid walking on wet floors.  Keep items that you use a lot in easy-to-reach places.  If you need to reach something above you, use a strong step stool that has a grab bar.  Keep electrical cords out of the way.  Do not use floor polish or wax that makes floors slippery. If you must use wax, use non-skid floor wax.  Do not have throw rugs and other things  on the floor that can make you trip. What can I do with my stairs?  Do not leave any items on the stairs.  Make sure that there are handrails on both sides of the stairs and use them. Fix handrails that are broken or loose. Make sure that handrails are as long as the stairways.  Check any carpeting to make sure that it is firmly attached to the stairs. Fix any carpet that is loose or worn.  Avoid having throw rugs at the top or bottom of the stairs. If you do have throw rugs, attach them to the floor with carpet tape.  Make sure that you have a light switch at the top of the stairs and the bottom of the stairs. If you do not have them, ask someone to add them for you. What else can I do to help prevent falls?  Wear shoes that:  Do not have high heels.  Have rubber bottoms.  Are comfortable and fit you well.  Are closed at the toe. Do not wear sandals.  If you use a stepladder:  Make  sure that it is fully opened. Do not climb a closed stepladder.  Make sure that both sides of the stepladder are locked into place.  Ask someone to hold it for you, if possible.  Clearly mark and make sure that you can see:  Any grab bars or handrails.  First and last steps.  Where the edge of each step is.  Use tools that help you move around (mobility aids) if they are needed. These include:  Canes.  Walkers.  Scooters.  Crutches.  Turn on the lights when you go into a dark area. Replace any light bulbs as soon as they burn out.  Set up your furniture so you have a clear path. Avoid moving your furniture around.  If any of your floors are uneven, fix them.  If there are any pets around you, be aware of where they are.  Review your medicines with your doctor. Some medicines can make you feel dizzy. This can increase your chance of falling. Ask your doctor what other things that you can do to help prevent falls. This information is not intended to replace advice given to you by your health care provider. Make sure you discuss any questions you have with your health care provider. Document Released: 12/22/2008 Document Revised: 08/03/2015 Document Reviewed: 04/01/2014 Elsevier Interactive Patient Education  2017 [employer].

## 2018-12-17 NOTE — Progress Notes (Signed)
Subjective:   Tyrone Curtis is a 68 y.o. male who presents for Medicare Annual/Subsequent preventive examination.  Virtual Visit via Telephone Note  I connected with [place] on 12/17/18 at 10:40 AM EDT by telephone and verified that I am speaking with the correct person using two identifiers.  Medicare Annual Wellness visit completed telephonically due to Covid-19 pandemic.   Location: Patient: home Provider: office   I discussed the limitations, risks, security and privacy concerns of performing an evaluation and management service by telephone and the availability of in person appointments. The patient expressed understanding and agreed to proceed.  Some vital signs may be absent or patient reported.   Clemetine Marker, LPN    Review of Systems:   Cardiac Risk Factors include: advanced age (>62men, >54 women);male gender;dyslipidemia     Objective:    Vitals: Ht 5\' 10"  (1.778 m)   Wt 193 lb (87.5 kg)   BMI 27.69 kg/m   Body mass index is 27.69 kg/m.  Advanced Directives 12/17/2018 08/29/2015 08/01/2014  Does Patient Have a Medical Advance Directive? Yes Yes No  Type of Paramedic of [place];Living will Living will;Healthcare Power of Attorney -  Copy of [place] in Chart? No - copy requested No - copy requested -  Would patient like information on creating a medical advance directive? - - No - patient declined information    Tobacco Social History   Tobacco Use  Smoking Status Former Smoker  . Packs/day: 2.00  . Years: 25.00  . Pack years: 50.00  . Types: Cigarettes  Smokeless Tobacco Never Used  Tobacco Comment   quit in 1984     Counseling given: Not Answered Comment: quit in 1984   Clinical Intake:  Pre-visit preparation completed: Yes  Pain : No/denies pain     BMI - recorded: 27.69 Nutritional Status: BMI 25 -29 Overweight Nutritional Risks: None Diabetes: No  How often do you need  to have someone help you when you read instructions, pamphlets, or other written materials from your doctor or pharmacy?: 1 - Never  Interpreter Needed?: No  Information entered by :: Clemetine Marker LPN  Past Medical History:  Diagnosis Date  . Allergic rhinitis, cause unspecified   . Allergy   . Anxiety state, unspecified   . Arthritis   . Basal cell carcinoma    facial  . Cataract   . Chicken pox   . CVA (cerebral vascular accident) ([place])   . Depression   . 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 Dysphagia, unspecified(787.20)   . Erectile dysfunction   . Herpes zoster without mention of complication   . Internal hemorrhoids without mention of complication   . Measles   . Mumps    x 2  . Personal history of colonic polyps   . Pure hypercholesterolemia   . Regurgitation    Past Surgical History:  Procedure Laterality Date  . basal cell carcinoma of anterior chest  2000   resection; [facility]  . cataract surgery  2010   with lens repacement Left  . COLONOSCOPY  09/23/12   normal.  Symptoms: anemia, hemoccult+.  [facility].  . ESOPHAGOGASTRODUODENOSCOPY  09/23/12   normal.  Symptoms: anemia, hemoccult +, GERD.  Marland Kitchen EYE SURGERY Bilateral    cataract  . FRACTURE SURGERY Left    1957 left arm  . small bowel mass  1991   removed part of small intestine benign mass  .  SMALL INTESTINE SURGERY     Family History  Problem Relation Age of Onset  . Hyperlipidemia Sister   . Mental illness Sister        anxiety  . Liver disease Sister   . Hyperlipidemia Brother   . Diabetes Brother   . Arthritis Brother        Knee replacement  . Bipolar disorder Brother   . Liver disease Brother   . Heart disease Brother 69       AMI/CAD with stenting  . Diabetes Mother   . Depression Mother   . Esophageal varices Mother   . Hyperlipidemia Mother   . Mental illness Mother   . Liver disease Mother   . Cancer Father        unknown   Social History    Socioeconomic History  . Marital status: Married    Spouse name: [name]  . Number of children: 1  . Years of education: Not on file  . Highest education level: Not on file  Occupational History  . Occupation: Disability    Comment: CVA  . Occupation: Previous Writer  Social Needs  . Financial resource strain: Not hard at all  . Food insecurity    Worry: Never true    Inability: Never true  . Transportation needs    Medical: No    Non-medical: No  Tobacco Use  . Smoking status: Former Smoker    Packs/day: 2.00    Years: 25.00    Pack years: 50.00    Types: Cigarettes  . Smokeless tobacco: Never Used  . Tobacco comment: quit in 1984  Substance and Sexual Activity  . Alcohol use: Yes    Alcohol/week: 6.0 standard drinks    Types: 6 Cans of beer per week    Comment: 6 drinks/week  . Drug use: No  . Sexual activity: Yes    Birth control/protection: Post-menopausal  Lifestyle  . Physical activity    Days per week: 7 days    Minutes per session: 80 min  . Stress: To some extent  Relationships  . Social connections    Talks on phone: More than three times a week    Gets together: Three times a week    Attends religious service: Patient refused    Active member of club or organization: No    Attends meetings of clubs or organizations: Never    Relationship status: Married  Other Topics Concern  . Not on file  Social History Narrative   Marital status:  Married x 43 years      Children: 1 daughte (45)r; 1 grandson (26)      Lives: with wife; has mountain house and [place] house [place] [employer]      Employment: disabled since CVA      Tobacco: none      Alcohol:  1-2 drinks per night      Drugs:  None      Exercise: very active in the yard. No formal exercise program in 2019.      ADLs: independent with ADLs; drives locally; maintains own yard.        Advanced Directives:  NONE in 2019.  FULL CODE; no prolonged measures.          Outpatient Encounter  Medications as of 12/17/2018  Medication Sig  . ASPIRIN 81 PO Take 81 mg by mouth 2 (two) times daily.  Marland Kitchen atorvastatin (LIPITOR) 80 MG tablet TAKE 1 TABLET(40 MG) BY MOUTH  DAILY  . loratadine (CLARITIN) 10 MG tablet Take 10 mg by mouth daily.  . Multiple Vitamin (MULTIVITAMINS PO) Take by mouth daily.  Marland Kitchen omeprazole (PRILOSEC) 40 MG capsule TAKE 1 CAPSULE(40 MG) BY MOUTH DAILY  . sertraline (ZOLOFT) 50 MG tablet TAKE 1 TABLET(50 MG) BY MOUTH DAILY  . fluorouracil (EFUDEX) 5 % cream Apply topically 2 (two) times daily.  . [DISCONTINUED] psyllium (METAMUCIL) 58.6 % powder Take 1 packet by mouth 3 (three) times daily.   No facility-administered encounter medications on file as of 12/17/2018.     Activities of Daily Living In your present state of health, do you have any difficulty performing the following activities: 12/17/2018 08/25/2018  Hearing? N N  Comment declines hearing aids -  Vision? N N  Difficulty concentrating or making decisions? N N  Walking or climbing stairs? N N  Dressing or bathing? N N  Doing errands, shopping? N N  Preparing Food and eating ? N -  Using the Toilet? N -  In the past six months, have you accidently leaked urine? N -  Do you have problems with loss of bowel control? N -  Managing your Medications? N -  Managing your Finances? N -  Housekeeping or managing your Housekeeping? N -  Some recent data might be hidden    Patient Care Team: Hubbard Hartshorn, FNP as PCP - General (Family Medicine) Minna Merritts, MD as Consulting Physician (Cardiology) Dasher, Rayvon Char, MD (Dermatology) Vladimir Crofts, MD as Consulting Physician (Neurology)   Assessment:   This is a routine wellness examination for [place].  Exercise Activities and Dietary recommendations Current Exercise Habits: Home exercise routine, Type of exercise: walking(working outside), Time (Minutes): > 60, Frequency (Times/Week): 7, Weekly Exercise (Minutes/Week): 0, Intensity: Moderate, Exercise  limited by: neurologic condition(s)  Goals    . Weight (lb) < 180 lb (81.6 kg)     Pt would like to lose at least 10 more pounds this year       Fall Risk Fall Risk  12/17/2018 08/25/2018 04/27/2018 02/24/2018 08/11/2017  Falls in the past year? 0 0 0 0 No  Comment - - - - -  Number falls in past yr: 0 0 0 0 -  Injury with Fall? 0 0 0 0 -  Comment - - - - -  Follow up Falls prevention discussed - Falls evaluation completed - -   FALL RISK PREVENTION PERTAINING TO THE HOME:  Any stairs in or around the home? Yes  If so, do they handrails? Yes   Home free of loose throw rugs in walkways, pet beds, electrical cords, etc? Yes  Adequate lighting in your home to reduce risk of falls? Yes   ASSISTIVE DEVICES UTILIZED TO PREVENT FALLS:  Life alert? No  Use of a cane, walker or w/c? No  Grab bars in the bathroom? Yes  Shower chair or bench in shower? No  Elevated toilet seat or a handicapped toilet? Yes   DME ORDERS:  DME order needed?  No   TIMED UP AND GO:  Was the test performed? No . Telephonic visit.   Education: Fall risk prevention has been discussed.  Intervention(s) required? No   Depression Screen PHQ 2/9 Scores 12/17/2018 08/25/2018 04/27/2018 02/24/2018  PHQ - 2 Score 0 0 0 0  PHQ- 9 Score - 0 0 0    Cognitive Function     6CIT Screen 12/17/2018  What Year? 0 points  What month? 0 points  What time? 0 points  Count back from 20 0 points  Months in reverse 0 points  Repeat phrase 0 points  Total Score 0    Immunization History  Administered Date(s) Administered  . Fluad Quad(high Dose 65+) 12/08/2018  . Influenza Split 01/02/2007, 02/06/2009, 04/03/2011, 12/16/2011  . Influenza, High Dose Seasonal PF 01/22/2018  . Influenza,inj,Quad PF,6+ Mos 01/03/2014, 01/09/2015, 01/16/2016, 01/20/2017  . Influenza-Unspecified 12/04/2012  . Pneumococcal Conjugate-13 08/01/2014  . Pneumococcal Polysaccharide-23 07/16/2016  . Tdap 04/30/2011  . Zoster 12/29/2014     Qualifies for Shingles Vaccine? Yes  Zostavax completed 2016. Due for Shingrix. Education has been provided regarding the importance of this vaccine. Pt has been advised to call insurance company to determine out of pocket expense. Advised may also receive vaccine at local pharmacy or Health Dept. Verbalized acceptance and understanding.  Tdap: Up to date  Flu Vaccine: Up to date  Pneumococcal Vaccine: Up to date    Screening Tests Health Maintenance  Topic Date Due  . TETANUS/TDAP  04/29/2021  . COLONOSCOPY  09/25/2022  . INFLUENZA VACCINE  Completed  . Hepatitis C Screening  Completed  . PNA vac Low Risk Adult  Completed   Cancer Screenings:  Colorectal Screening: Completed 09/24/12. Repeat every 10 years;   Lung Cancer Screening: (Low Dose CT Chest recommended if Age 24-80 years, 30 pack-year currently smoking OR have quit w/in 15years.) does not qualify.   Additional Screening:  Hepatitis C Screening: does qualify; Completed 01/09/15  Vision Screening: Recommended annual ophthalmology exams for early detection of glaucoma and other disorders of the eye. Is the patient up to date with their annual eye exam?  Yes  Who is the provider or what is the name of the office in which the pt attends annual eye exams? Dr. Matilde Sprang  Dental Screening: Recommended annual dental exams for proper oral hygiene  Community Resource Referral:  CRR required this visit?  No       Plan:    I have personally reviewed and addressed the Medicare Annual Wellness questionnaire and have noted the following in the patient's chart:  A. Medical and social history B. Use of alcohol, tobacco or illicit drugs  C. Current medications and supplements D. Functional ability and status E.  Nutritional status F.  Physical activity G. Advance directives H. List of other physicians I.  Hospitalizations, surgeries, and ER visits in previous 12 months J.  [place] such as hearing and vision if  needed, cognitive and depression L. Referrals and appointments   In addition, I have reviewed and discussed with patient certain preventive protocols, quality metrics, and best practice recommendations. A written personalized care plan for preventive services as well as general preventive health recommendations were provided to patient.   Signed,  Clemetine Marker, LPN Nurse Health Advisor   Nurse Notes: pt doing well and appreciative of visit today.

## 2019-01-18 ENCOUNTER — Encounter: Payer: Self-pay | Admitting: Family Medicine

## 2019-02-09 ENCOUNTER — Encounter: Payer: Self-pay | Admitting: Family Medicine

## 2019-02-09 DIAGNOSIS — F4323 Adjustment disorder with mixed anxiety and depressed mood: Secondary | ICD-10-CM

## 2019-02-09 MED ORDER — SERTRALINE HCL 25 MG PO TABS: 12.5000 mg | ORAL_TABLET | Freq: Every day | ORAL | 0 refills | Status: DC

## 2019-02-22 ENCOUNTER — Encounter: Payer: Self-pay | Admitting: Family Medicine

## 2019-02-26 ENCOUNTER — Ambulatory Visit: Payer: PPO | Admitting: Family Medicine

## 2019-03-03 ENCOUNTER — Ambulatory Visit (INDEPENDENT_AMBULATORY_CARE_PROVIDER_SITE_OTHER): Payer: PPO | Admitting: Emergency Medicine

## 2019-03-03 ENCOUNTER — Encounter: Payer: Self-pay | Admitting: Emergency Medicine

## 2019-03-03 ENCOUNTER — Other Ambulatory Visit: Payer: Self-pay

## 2019-03-03 VITALS — BP 125/79 | HR 79 | Temp 97.9°F | Wt 192.2 lb

## 2019-03-03 DIAGNOSIS — Z8673 Personal history of transient ischemic attack (TIA), and cerebral infarction without residual deficits: Secondary | ICD-10-CM

## 2019-03-03 DIAGNOSIS — Z7689 Persons encountering health services in other specified circumstances: Secondary | ICD-10-CM

## 2019-03-03 DIAGNOSIS — E782 Mixed hyperlipidemia: Secondary | ICD-10-CM

## 2019-03-03 DIAGNOSIS — F4323 Adjustment disorder with mixed anxiety and depressed mood: Secondary | ICD-10-CM

## 2019-03-03 DIAGNOSIS — Z0000 Encounter for general adult medical examination without abnormal findings: Secondary | ICD-10-CM

## 2019-03-03 DIAGNOSIS — K219 Gastro-esophageal reflux disease without esophagitis: Secondary | ICD-10-CM | POA: Diagnosis not present

## 2019-03-03 DIAGNOSIS — Z0001 Encounter for general adult medical examination with abnormal findings: Secondary | ICD-10-CM

## 2019-03-03 MED ORDER — OMEPRAZOLE 40 MG PO CPDR: DELAYED_RELEASE_CAPSULE | ORAL | 3 refills | Status: DC

## 2019-03-03 MED ORDER — ATORVASTATIN CALCIUM 80 MG PO TABS: ORAL_TABLET | ORAL | 1 refills | Status: DC

## 2019-03-03 NOTE — Progress Notes (Signed)
Tyrone Curtis 68 y.o.   Chief Complaint  Patient presents with  . Establish Care    need to have annual labs. Hx of stroke.    HISTORY OF PRESENT ILLNESS: This is a 68 y.o. male here to establish care with me.  Needs annual labs. Has the following chronic medical problems: 1.  Status post CVA 2012.  Sees neurologist, Dr. Brigitte Pulse, regularly. 2.  Hyperlipidemia: On Lipitor 80 mg daily. 3.  History of GERD, on Prilosec 40 mg daily. 4.  History of depression/anxiety but doing much better and tapering down on Zoloft 12.5 mg daily. Doing well.  Has no complaints or medical concerns.  HPI   Prior to Admission medications   Medication Sig Start Date End Date Taking? Authorizing Provider  ASPIRIN 81 PO Take 81 mg by mouth 2 (two) times daily.   Yes [provider]  atorvastatin (LIPITOR) 80 MG tablet TAKE 1 TABLET(40 MG) BY MOUTH DAILY 08/25/18  Yes Hubbard Hartshorn, FNP  loratadine (CLARITIN) 10 MG tablet Take 10 mg by mouth daily.   Yes [provider]  Multiple Vitamin (MULTIVITAMINS PO) Take by mouth daily.   Yes [provider]  omeprazole (PRILOSEC) 40 MG capsule TAKE 1 CAPSULE(40 MG) BY MOUTH DAILY 08/25/18  Yes Hubbard Hartshorn, FNP  sertraline (ZOLOFT) 25 MG tablet Take 0.5 tablets (12.5 mg total) by mouth daily. Stop medication once your are out of this Rx. 02/09/19  Yes Hubbard Hartshorn, FNP    Allergies  Allergen Reactions  . Zyrtec D [Cetirizine-Pseudoephedrine Er]     "talking out of his head"    Patient Active Problem List   Diagnosis Date Noted  . Prediabetes 01/02/2018  . Overweight (BMI 25.0-29.9) 01/02/2018  . History of basal cell carcinoma 08/12/2017  . Paroxysmal tachycardia ([place]) 08/23/2016  . Smoking history 08/23/2016  . Adjustment disorder with mixed anxiety and depressed mood 07/16/2016  . Hyperlipidemia 01/06/2014  . Gastroesophageal reflux disease without esophagitis 01/06/2014  . Hemiparesis affecting left side as late effect of  cerebrovascular accident (CVA) ([place]) 01/06/2014  . Insomnia 07/26/2013  . Erectile dysfunction 12/16/2011    Past Medical History:  Diagnosis Date  . Allergic rhinitis, cause unspecified   . Allergy   . Anxiety state, unspecified   . Arthritis   . Basal cell carcinoma    facial  . Cataract   . Chicken pox   . CVA (cerebral vascular accident) ([place])   . Depression   . 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 Dysphagia, unspecified(787.20)   . Erectile dysfunction   . Herpes zoster without mention of complication   . Internal hemorrhoids without mention of complication   . Measles   . Mumps    x 2  . Personal history of colonic polyps   . Pure hypercholesterolemia   . Regurgitation     Past Surgical History:  Procedure Laterality Date  . basal cell carcinoma of anterior chest  2000   resection; [facility]  . cataract surgery  2010   with lens repacement Left  . COLONOSCOPY  09/23/12   normal.  Symptoms: anemia, hemoccult+.  [facility].  . ESOPHAGOGASTRODUODENOSCOPY  09/23/12   normal.  Symptoms: anemia, hemoccult +, GERD.  [facility] EYE SURGERY Bilateral    cataract  . FRACTURE SURGERY Left    1957 left arm  . small bowel mass  1991   removed part of small intestine benign mass  . SMALL INTESTINE SURGERY  Social History   Socioeconomic History  . Marital status: Married    Spouse name: [name]  . Number of children: 1  . Years of education: Not on file  . Highest education level: Not on file  Occupational History  . Occupation: Disability    Comment: CVA  . Occupation: Previous Financial controller of [employer]  Tobacco Use  . Smoking status: Former Smoker    Packs/day: 2.00    Years: 25.00    Pack years: 50.00    Types: Cigarettes  . Smokeless tobacco: Never Used  . Tobacco comment: quit in 1984  Substance and Sexual Activity  . Alcohol use: Yes    Alcohol/week: 6.0 standard drinks    Types: 6 Cans of beer per week    Comment: 6  drinks/week  . Drug use: No  . Sexual activity: Yes    Birth control/protection: Post-menopausal  Other Topics Concern  . Not on file  Social History Narrative   Marital status:  Married x 43 years      Children: 1 daughte (45)r; 1 grandson (27)      Lives: with wife; has mountain house and [place] house at [employer]      Employment: disabled since CVA      Tobacco: none      Alcohol:  1-2 drinks per night      Drugs:  None      Exercise: very active in the yard. No formal exercise program in 2019.      ADLs: independent with ADLs; drives locally; maintains own yard.        Advanced Directives:  NONE in 2019.  FULL CODE; no prolonged measures.         Social Determinants of Health   Financial Resource Strain:   . Difficulty of Paying Living Expenses: Not on file  Food Insecurity: No Food Insecurity  . Worried About Charity fundraiser in the Last Year: Never true  . Ran Out of Food in the Last Year: Never true  Transportation Needs:   . Lack of Transportation (Medical): Not on file  . Lack of Transportation (Non-Medical): Not on file  Physical Activity:   . Days of Exercise per Week: Not on file  . Minutes of Exercise per Session: Not on file  Stress:   . Feeling of Stress : Not on file  Social Connections:   . Frequency of Communication with Friends and Family: Not on file  . Frequency of Social Gatherings with Friends and Family: Not on file  . Attends Religious Services: Not on file  . Active Member of Clubs or Organizations: Not on file  . Attends Archivist Meetings: Not on file  . Marital Status: Not on file  Intimate Partner Violence:   . Fear of Current or Ex-Partner: Not on file  . Emotionally Abused: Not on file  . Physically Abused: Not on file  . Sexually Abused: Not on file    Family History  Problem Relation Age of Onset  . Hyperlipidemia Sister   . Mental illness Sister        anxiety  . Liver disease Sister   . Hyperlipidemia Brother    . Diabetes Brother   . Arthritis Brother        Knee replacement  . Bipolar disorder Brother   . Liver disease Brother   . Heart disease Brother 19       AMI/CAD with stenting  . Diabetes Mother   . Depression  Mother   . Esophageal varices Mother   . Hyperlipidemia Mother   . Mental illness Mother   . Liver disease Mother   . Cancer Father        unknown     Review of Systems  Constitutional: Negative.  Negative for chills, fever and weight loss.  HENT: Negative.  Negative for congestion and sore throat.   Respiratory: Negative.  Negative for cough and shortness of breath.   Cardiovascular: Negative.  Negative for chest pain and palpitations.  Gastrointestinal: Negative.  Negative for abdominal pain, diarrhea, nausea and vomiting.  Genitourinary: Negative.  Negative for dysuria and hematuria.  Musculoskeletal: Negative.  Negative for back pain, myalgias and neck pain.  Skin: Negative.  Negative for rash.  Neurological: Negative.  Negative for dizziness and headaches.  All other systems reviewed and are negative.  Vitals:   03/03/19 1310  BP: 125/79  Pulse: 79  Temp: 97.9 F (36.6 C)  SpO2: 98%     Physical Exam Vitals reviewed.  Constitutional:      Appearance: Normal appearance.  HENT:     Head: Normocephalic.  Eyes:     Extraocular Movements: Extraocular movements intact.     Conjunctiva/sclera: Conjunctivae normal.     Pupils: Pupils are equal, round, and reactive to light.  Neck:     Vascular: No carotid bruit.  Cardiovascular:     Rate and Rhythm: Normal rate and regular rhythm.     Pulses: Normal pulses.     Heart sounds: Normal heart sounds.  Pulmonary:     Effort: Pulmonary effort is normal.     Breath sounds: Normal breath sounds.  Abdominal:     Palpations: Abdomen is soft.     Tenderness: There is no abdominal tenderness.  Musculoskeletal:        General: Normal range of motion.     Cervical back: Normal range of motion and neck supple.   Lymphadenopathy:     Cervical: No cervical adenopathy.  Skin:    General: Skin is warm.     Capillary Refill: Capillary refill takes less than 2 seconds.  Neurological:     General: No focal deficit present.     Mental Status: He is alert and oriented to person, place, and time.  Psychiatric:        Mood and Affect: Mood normal.        Behavior: Behavior normal.      ASSESSMENT & PLAN: Tyrone Curtis was seen today for establish care.  Diagnoses and all orders for this visit:  Routine general medical examination at a health care facility  Mixed hyperlipidemia -     Lipid Panel -     atorvastatin (LIPITOR) 80 MG tablet; TAKE 1 TABLET(40 MG) BY MOUTH DAILY  Gastroesophageal reflux disease without esophagitis -     Comprehensive metabolic panel -     omeprazole (PRILOSEC) 40 MG capsule; TAKE 1 CAPSULE(40 MG) BY MOUTH DAILY  Encounter to establish care  History of CVA (cerebrovascular accident)  Adjustment disorder with mixed anxiety and depressed mood     Patient Instructions       If you have lab work done today you will be contacted with your lab results within the next 2 weeks.  If you have not heard from [place] then please contact us. The fastest way to get your results is to register for My Chart.   IF you received an x-ray today, you will receive an invoice from [facility]. Please contact  [facility] at [phone] with questions or concerns regarding your invoice.   IF you received labwork today, you will receive an invoice from [place]. Please contact [facility] at [phone] with questions or concerns regarding your invoice.   Our billing staff will not be able to assist you with questions regarding bills from these companies.  You will be contacted with the lab results as soon as they are available. The fastest way to get your results is to activate your My Chart account. Instructions are located on the last page of this paperwork. If you have  not heard from [place] regarding the results in 2 weeks, please contact this office.       Health Maintenance, Male Adopting a healthy lifestyle and getting preventive care are important in promoting health and wellness. Ask your health care provider about:  The right schedule for you to have regular tests and exams.  Things you can do on your own to prevent diseases and keep yourself healthy. What should I know about diet, weight, and exercise? Eat a healthy diet   Eat a diet that includes plenty of vegetables, fruits, low-fat dairy products, and lean protein.  Do not eat a lot of foods that are high in solid fats, added sugars, or sodium. Maintain a healthy weight Body mass index (BMI) is a measurement that can be used to identify possible weight problems. It estimates body fat based on height and weight. Your health care provider can help determine your BMI and help you achieve or maintain a healthy weight. Get regular exercise Get regular exercise. This is one of the most important things you can do for your health. Most adults should:  Exercise for at least 150 minutes each week. The exercise should increase your heart rate and make you sweat (moderate-intensity exercise).  Do strengthening exercises at least twice a week. This is in addition to the moderate-intensity exercise.  Spend less time sitting. Even light physical activity can be beneficial. Watch cholesterol and blood lipids Have your blood tested for lipids and cholesterol at 70 years of age, then have this test every 5 years. You may need to have your cholesterol levels checked more often if:  Your lipid or cholesterol levels are high.  You are older than 70 years of age.  You are at high risk for heart disease. What should I know about cancer screening? Many types of cancers can be detected early and may often be prevented. Depending on your health history and family history, you may need to have cancer screening  at various ages. This may include screening for:  Colorectal cancer.  Prostate cancer.  Skin cancer.  Lung cancer. What should I know about heart disease, diabetes, and high blood pressure? Blood pressure and heart disease  High blood pressure causes heart disease and increases the risk of stroke. This is more likely to develop in people who have high blood pressure readings, are of African descent, or are overweight.  Talk with your health care provider about your target blood pressure readings.  Have your blood pressure checked: ? Every 3-5 years if you are 61-66 years of age. ? Every year if you are 46 years old or older.  If you are between the ages of 10 and 36 and are a current or former smoker, ask your health care provider if you should have a one-time screening for abdominal aortic aneurysm (AAA). Diabetes Have regular diabetes screenings. This checks your fasting blood sugar level. Have the screening  done:  Once every three years after age 5 if you are at a normal weight and have a low risk for diabetes.  More often and at a younger age if you are overweight or have a high risk for diabetes. What should I know about preventing infection? Hepatitis B If you have a higher risk for hepatitis B, you should be screened for this virus. Talk with your health care provider to find out if you are at risk for hepatitis B infection. Hepatitis C Blood testing is recommended for:  Everyone born from [birth year] through 1965.  Anyone with known risk factors for hepatitis C. Sexually transmitted infections (STIs)  You should be screened each year for STIs, including gonorrhea and chlamydia, if: ? You are sexually active and are younger than 70 years of age. ? You are older than 70 years of age and your health care provider tells you that you are at risk for this type of infection. ? Your sexual activity has changed since you were last screened, and you are at increased risk for  chlamydia or gonorrhea. Ask your health care provider if you are at risk.  Ask your health care provider about whether you are at high risk for HIV. Your health care provider may recommend a prescription medicine to help prevent HIV infection. If you choose to take medicine to prevent HIV, you should first get tested for HIV. You should then be tested every 3 months for as long as you are taking the medicine. Follow these instructions at home: Lifestyle  Do not use any products that contain nicotine or tobacco, such as cigarettes, e-cigarettes, and chewing tobacco. If you need help quitting, ask your health care provider.  Do not use street drugs.  Do not share needles.  Ask your health care provider for help if you need support or information about quitting drugs. Alcohol use  Do not drink alcohol if your health care provider tells you not to drink.  If you drink alcohol: ? Limit how much you have to 0-2 drinks a day. ? Be aware of how much alcohol is in your drink. In the [place] one drink equals one 12 oz bottle of beer (355 mL), one 5 oz glass of wine (148 mL), or one 1 oz glass of hard liquor (44 mL). General instructions  Schedule regular health, dental, and eye exams.  Stay current with your vaccines.  Tell your health care provider if: ? You often feel depressed. ? You have ever been abused or do not feel safe at home. Summary  Adopting a healthy lifestyle and getting preventive care are important in promoting health and wellness.  Follow your health care provider's instructions about healthy diet, exercising, and getting tested or screened for diseases.  Follow your health care provider's instructions on monitoring your cholesterol and blood pressure. This information is not intended to replace advice given to you by your health care provider. Make sure you discuss any questions you have with your health care provider. Document Released: 08/24/2007 Document Revised:  02/18/2018 Document Reviewed: 02/18/2018 Elsevier Patient Education  2020 Elsevier Inc.      Agustina Caroli, MD [facility]

## 2019-03-03 NOTE — Patient Instructions (Addendum)
   If you have lab work done today you will be contacted with your lab results within the next 2 weeks.  If you have not heard from us then please contact us. The fastest way to get your results is to register for My Chart.   IF you received an x-ray today, you will receive an invoice from [facility]. Please contact [facility] at [phone] with questions or concerns regarding your invoice.   IF you received labwork today, you will receive an invoice from [facility]. Please contact [facility] at [phone] with questions or concerns regarding your invoice.   Our billing staff will not be able to assist you with questions regarding bills from these companies.  You will be contacted with the lab results as soon as they are available. The fastest way to get your results is to activate your My Chart account. Instructions are located on the last page of this paperwork. If you have not heard from us regarding the results in 2 weeks, please contact this office.     Health Maintenance, Male Adopting a healthy lifestyle and getting preventive care are important in promoting health and wellness. Ask your health care provider about:  The right schedule for you to have regular tests and exams.  Things you can do on your own to prevent diseases and keep yourself healthy. What should I know about diet, weight, and exercise? Eat a healthy diet   Eat a diet that includes plenty of vegetables, fruits, low-fat dairy products, and lean protein.  Do not eat a lot of foods that are high in solid fats, added sugars, or sodium. Maintain a healthy weight Body mass index (BMI) is a measurement that can be used to identify possible weight problems. It estimates body fat based on height and weight. Your health care provider can help determine your BMI and help you achieve or maintain a healthy weight. Get regular exercise Get regular exercise. This is one of the most important things you  can do for your health. Most adults should:  Exercise for at least 150 minutes each week. The exercise should increase your heart rate and make you sweat (moderate-intensity exercise).  Do strengthening exercises at least twice a week. This is in addition to the moderate-intensity exercise.  Spend less time sitting. Even light physical activity can be beneficial. Watch cholesterol and blood lipids Have your blood tested for lipids and cholesterol at 70 years of age, then have this test every 5 years. You may need to have your cholesterol levels checked more often if:  Your lipid or cholesterol levels are high.  You are older than 70 years of age.  You are at high risk for heart disease. What should I know about cancer screening? Many types of cancers can be detected early and may often be prevented. Depending on your health history and family history, you may need to have cancer screening at various ages. This may include screening for:  Colorectal cancer.  Prostate cancer.  Skin cancer.  Lung cancer. What should I know about heart disease, diabetes, and high blood pressure? Blood pressure and heart disease  High blood pressure causes heart disease and increases the risk of stroke. This is more likely to develop in people who have high blood pressure readings, are of African descent, or are overweight.  Talk with your health care provider about your target blood pressure readings.  Have your blood pressure checked: ? Every 3-5 years if you are 18-39 years   of age. ? Every year if you are 40 years old or older.  If you are between the ages of 65 and 75 and are a current or former smoker, ask your health care provider if you should have a one-time screening for abdominal aortic aneurysm (AAA). Diabetes Have regular diabetes screenings. This checks your fasting blood sugar level. Have the screening done:  Once every three years after age 45 if you are at a normal weight and have  a low risk for diabetes.  More often and at a younger age if you are overweight or have a high risk for diabetes. What should I know about preventing infection? Hepatitis B If you have a higher risk for hepatitis B, you should be screened for this virus. Talk with your health care provider to find out if you are at risk for hepatitis B infection. Hepatitis C Blood testing is recommended for:  Everyone born from 1945 through 1965.  Anyone with known risk factors for hepatitis C. Sexually transmitted infections (STIs)  You should be screened each year for STIs, including gonorrhea and chlamydia, if: ? You are sexually active and are younger than 70 years of age. ? You are older than 70 years of age and your health care provider tells you that you are at risk for this type of infection. ? Your sexual activity has changed since you were last screened, and you are at increased risk for chlamydia or gonorrhea. Ask your health care provider if you are at risk.  Ask your health care provider about whether you are at high risk for HIV. Your health care provider may recommend a prescription medicine to help prevent HIV infection. If you choose to take medicine to prevent HIV, you should first get tested for HIV. You should then be tested every 3 months for as long as you are taking the medicine. Follow these instructions at home: Lifestyle  Do not use any products that contain nicotine or tobacco, such as cigarettes, e-cigarettes, and chewing tobacco. If you need help quitting, ask your health care provider.  Do not use street drugs.  Do not share needles.  Ask your health care provider for help if you need support or information about quitting drugs. Alcohol use  Do not drink alcohol if your health care provider tells you not to drink.  If you drink alcohol: ? Limit how much you have to 0-2 drinks a day. ? Be aware of how much alcohol is in your drink. In the [place] one drink equals one 12  oz bottle of beer (355 mL), one 5 oz glass of wine (148 mL), or one 1 oz glass of hard liquor (44 mL). General instructions  Schedule regular health, dental, and eye exams.  Stay current with your vaccines.  Tell your health care provider if: ? You often feel depressed. ? You have ever been abused or do not feel safe at home. Summary  Adopting a healthy lifestyle and getting preventive care are important in promoting health and wellness.  Follow your health care provider's instructions about healthy diet, exercising, and getting tested or screened for diseases.  Follow your health care provider's instructions on monitoring your cholesterol and blood pressure. This information is not intended to replace advice given to you by your health care provider. Make sure you discuss any questions you have with your health care provider. Document Released: 08/24/2007 Document Revised: 02/18/2018 Document Reviewed: 02/18/2018 Elsevier Patient Education  2020 Elsevier Inc.  

## 2019-03-04 ENCOUNTER — Ambulatory Visit: Payer: PPO | Admitting: Family Medicine

## 2019-03-04 LAB — LIPID PANEL
Chol/HDL Ratio: 3.2 ratio (ref 0.0–5.0)
Cholesterol, Total: 168 mg/dL (ref 100–199)
HDL: 53 mg/dL (ref >39)
LDL Chol Calc (NIH): 104 mg/dL — ABNORMAL HIGH (ref 0–99)
Triglycerides: 58 mg/dL (ref 0–149)
VLDL Cholesterol Cal: 11 mg/dL (ref 5–40)

## 2019-03-04 LAB — COMPREHENSIVE METABOLIC PANEL
ALT: 21 IU/L (ref 0–44)
AST: 32 IU/L (ref 0–40)
Albumin/Globulin Ratio: 1.7 (ref 1.2–2.2)
Albumin: 4.7 g/dL (ref 3.8–4.8)
Alkaline Phosphatase: 86 IU/L (ref 39–117)
BUN/Creatinine Ratio: 16 (ref 10–24)
BUN: 14 mg/dL (ref 8–27)
Bilirubin Total: 0.8 mg/dL (ref 0.0–1.2)
CO2: 22 mmol/L (ref 20–29)
Calcium: 10.4 mg/dL — ABNORMAL HIGH (ref 8.6–10.2)
Chloride: 100 mmol/L (ref 96–106)
Creatinine, Ser: 0.9 mg/dL (ref 0.76–1.27)
GFR calc Af Amer: 100 mL/min/{1.73_m2} (ref >59)
GFR calc non Af Amer: 86 mL/min/{1.73_m2} (ref >59)
Globulin, Total: 2.7 g/dL (ref 1.5–4.5)
Glucose: 72 mg/dL (ref 65–99)
Potassium: 5.3 mmol/L — ABNORMAL HIGH (ref 3.5–5.2)
Sodium: 140 mmol/L (ref 134–144)
Total Protein: 7.4 g/dL (ref 6.0–8.5)

## 2019-04-30 DIAGNOSIS — D045 Carcinoma in situ of skin of trunk: Secondary | ICD-10-CM | POA: Diagnosis not present

## 2019-04-30 DIAGNOSIS — D485 Neoplasm of uncertain behavior of skin: Secondary | ICD-10-CM | POA: Diagnosis not present

## 2019-04-30 DIAGNOSIS — L570 Actinic keratosis: Secondary | ICD-10-CM | POA: Diagnosis not present

## 2019-04-30 DIAGNOSIS — X32XXXA Exposure to sunlight, initial encounter: Secondary | ICD-10-CM | POA: Diagnosis not present

## 2019-04-30 DIAGNOSIS — C44629 Squamous cell carcinoma of skin of left upper limb, including shoulder: Secondary | ICD-10-CM | POA: Diagnosis not present

## 2019-04-30 DIAGNOSIS — D2261 Melanocytic nevi of right upper limb, including shoulder: Secondary | ICD-10-CM | POA: Diagnosis not present

## 2019-04-30 DIAGNOSIS — D225 Melanocytic nevi of trunk: Secondary | ICD-10-CM | POA: Diagnosis not present

## 2019-04-30 DIAGNOSIS — L820 Inflamed seborrheic keratosis: Secondary | ICD-10-CM | POA: Diagnosis not present

## 2019-04-30 DIAGNOSIS — D2271 Melanocytic nevi of right lower limb, including hip: Secondary | ICD-10-CM | POA: Diagnosis not present

## 2019-04-30 DIAGNOSIS — L821 Other seborrheic keratosis: Secondary | ICD-10-CM | POA: Diagnosis not present

## 2019-04-30 DIAGNOSIS — L538 Other specified erythematous conditions: Secondary | ICD-10-CM | POA: Diagnosis not present

## 2019-04-30 DIAGNOSIS — D2262 Melanocytic nevi of left upper limb, including shoulder: Secondary | ICD-10-CM | POA: Diagnosis not present

## 2019-04-30 DIAGNOSIS — Z85828 Personal history of other malignant neoplasm of skin: Secondary | ICD-10-CM | POA: Diagnosis not present

## 2019-05-17 ENCOUNTER — Encounter: Payer: Self-pay | Admitting: Emergency Medicine

## 2019-05-17 ENCOUNTER — Emergency Department
Admission: EM | Admit: 2019-05-17 | Discharge: 2019-05-17 | Disposition: A | Discharge status: Home / Self Care | Payer: PPO | Attending: Emergency Medicine | Admitting: Emergency Medicine

## 2019-05-17 ENCOUNTER — Other Ambulatory Visit: Payer: Self-pay

## 2019-05-17 ENCOUNTER — Emergency Department: Payer: PPO

## 2019-05-17 DIAGNOSIS — Z87891 Personal history of nicotine dependence: Secondary | ICD-10-CM | POA: Diagnosis not present

## 2019-05-17 DIAGNOSIS — Y998 Other external cause status: Secondary | ICD-10-CM | POA: Diagnosis not present

## 2019-05-17 DIAGNOSIS — Z85828 Personal history of other malignant neoplasm of skin: Secondary | ICD-10-CM | POA: Insufficient documentation

## 2019-05-17 DIAGNOSIS — Z7982 Long term (current) use of aspirin: Secondary | ICD-10-CM | POA: Insufficient documentation

## 2019-05-17 DIAGNOSIS — W01198A Fall on same level from slipping, tripping and stumbling with subsequent striking against other object, initial encounter: Secondary | ICD-10-CM | POA: Diagnosis not present

## 2019-05-17 DIAGNOSIS — Z79899 Other long term (current) drug therapy: Secondary | ICD-10-CM | POA: Insufficient documentation

## 2019-05-17 DIAGNOSIS — Y9389 Activity, other specified: Secondary | ICD-10-CM | POA: Diagnosis not present

## 2019-05-17 DIAGNOSIS — S299XXA Unspecified injury of thorax, initial encounter: Secondary | ICD-10-CM | POA: Diagnosis not present

## 2019-05-17 DIAGNOSIS — Y92012 Bathroom of single-family (private) house as the place of occurrence of the external cause: Secondary | ICD-10-CM | POA: Insufficient documentation

## 2019-05-17 DIAGNOSIS — W19XXXA Unspecified fall, initial encounter: Secondary | ICD-10-CM

## 2019-05-17 DIAGNOSIS — S20211A Contusion of right front wall of thorax, initial encounter: Secondary | ICD-10-CM | POA: Diagnosis not present

## 2019-05-17 DIAGNOSIS — Z8673 Personal history of transient ischemic attack (TIA), and cerebral infarction without residual deficits: Secondary | ICD-10-CM | POA: Diagnosis not present

## 2019-05-17 MED ORDER — OXYCODONE HCL 5 MG PO TABS: 5.0000 mg | ORAL_TABLET | Freq: Four times a day (QID) | ORAL | 0 refills | Status: DC | PRN

## 2019-05-17 MED ORDER — OXYCODONE-ACETAMINOPHEN 5-325 MG PO TABS
1.0000 | ORAL_TABLET | Freq: Once | ORAL | Status: AC
  Administered 2019-05-17: 1 via ORAL
  Filled 2019-05-17: qty 1

## 2019-05-17 NOTE — ED Notes (Addendum)
Patient states he fell out of tub and hit his back between his ribs and butt on toilet. 9/10 of pain per patient. Patient states no LOC.

## 2019-05-17 NOTE — ED Provider Notes (Signed)
[facility] EMERGENCY DEPARTMENT Provider Note   CSN: [id number] Arrival date & time: 05/17/19  1901     History Chief Complaint  Patient presents with  . Fall    Tyrone Curtis is a 69 y.o. male.  Presents to the emergency department for evaluation of right rib pain.  Patient states he tripped in the bathroom, fell backwards hitting his right ribs along the toilet, denies hitting his head or losing consciousness.  Complains of only right rib pain.  No upper extremity or lower extremity discomfort.  No neck pain, numbness tingling radicular symptoms.  He is not any medications for pain.  Pain is severe 10 out of 10 worse with taking a deep breath and with movement.  Pain is improved with being still.  He denies any chest pain or shortness of breath.  He takes aspirin daily.  Is ambulatory with no assistive devices but with moderate pain.  HPI     Past Medical History:  Diagnosis Date  . Allergic rhinitis, cause unspecified   . Allergy   . Anxiety state, unspecified   . Arthritis   . Basal cell carcinoma    facial  . Cataract   . Chicken pox   . CVA (cerebral vascular accident) ([place])   . Depression   . 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 Dysphagia, unspecified(787.20)   . Erectile dysfunction   . Herpes zoster without mention of complication   . Internal hemorrhoids without mention of complication   . Measles   . Mumps    x 2  . Personal history of colonic polyps   . Pure hypercholesterolemia   . Regurgitation     Patient Active Problem List   Diagnosis Date Noted  . Prediabetes 01/02/2018  . Overweight (BMI 25.0-29.9) 01/02/2018  . History of basal cell carcinoma 08/12/2017  . Paroxysmal tachycardia ([place]) 08/23/2016  . Smoking history 08/23/2016  . Adjustment disorder with mixed anxiety and depressed mood 07/16/2016  . Hyperlipidemia 01/06/2014  . Gastroesophageal reflux disease without esophagitis  01/06/2014  . Hemiparesis affecting left side as late effect of cerebrovascular accident (CVA) ([place]) 01/06/2014  . Insomnia 07/26/2013  . Erectile dysfunction 12/16/2011    Past Surgical History:  Procedure Laterality Date  . basal cell carcinoma of anterior chest  2000   resection; [facility]  . cataract surgery  2010   with lens repacement Left  . COLONOSCOPY  09/23/12   normal.  Symptoms: anemia, hemoccult+.  [facility].  . ESOPHAGOGASTRODUODENOSCOPY  09/23/12   normal.  Symptoms: anemia, hemoccult +, GERD.  [facility] EYE SURGERY Bilateral    cataract  . FRACTURE SURGERY Left    1957 left arm  . small bowel mass  1991   removed part of small intestine benign mass  . SMALL INTESTINE SURGERY         Family History  Problem Relation Age of Onset  . Hyperlipidemia Sister   . Mental illness Sister        anxiety  . Liver disease Sister   . Hyperlipidemia Brother   . Diabetes Brother   . Arthritis Brother        Knee replacement  . Bipolar disorder Brother   . Liver disease Brother   . Heart disease Brother 59       AMI/CAD with stenting  . Diabetes Mother   . Depression Mother   . Esophageal varices Mother   . Hyperlipidemia Mother   .  Mental illness Mother   . Liver disease Mother   . Cancer Father        unknown    Social History   Tobacco Use  . Smoking status: Former Smoker    Packs/day: 2.00    Years: 25.00    Pack years: 50.00    Types: Cigarettes  . Smokeless tobacco: Never Used  . Tobacco comment: quit in 1984  Substance Use Topics  . Alcohol use: Yes    Alcohol/week: 6.0 standard drinks    Types: 6 Cans of beer per week    Comment: 6 drinks/week  . Drug use: No    Home Medications Prior to Admission medications   Medication Sig Start Date End Date Taking? Authorizing Provider  ASPIRIN 81 PO Take 81 mg by mouth 2 (two) times daily.    [provider]  atorvastatin (LIPITOR) 80 MG tablet TAKE 1 TABLET(40 MG) BY MOUTH DAILY 03/03/19    Horald Pollen, MD  loratadine (CLARITIN) 10 MG tablet Take 10 mg by mouth daily.    [provider]  Multiple Vitamin (MULTIVITAMINS PO) Take by mouth daily.    [provider]  omeprazole (PRILOSEC) 40 MG capsule TAKE 1 CAPSULE(40 MG) BY MOUTH DAILY 03/03/19   Horald Pollen, MD  oxyCODONE (ROXICODONE) 5 MG immediate release tablet Take 1 tablet (5 mg total) by mouth every 6 (six) hours as needed. 05/17/19 05/16/20  Duanne Guess, PA-C  sertraline (ZOLOFT) 25 MG tablet Take 0.5 tablets (12.5 mg total) by mouth daily. Stop medication once your are out of this Rx. 02/09/19   Hubbard Hartshorn, FNP    Allergies    Zyrtec d [cetirizine-pseudoephedrine er]  Review of Systems   Review of Systems  Constitutional: Negative for fever.  Respiratory: Negative for cough, chest tightness and shortness of breath.   Cardiovascular: Negative for chest pain.  Gastrointestinal: Negative for nausea and vomiting.  Musculoskeletal: Positive for myalgias. Negative for arthralgias, gait problem and neck pain.  Neurological: Negative for headaches.    Physical Exam Updated Vital Signs BP (!) 139/95 (BP Location: Left Arm)   Pulse 68   Temp 98.5 F (36.9 C) (Oral)   Resp 16   Ht 6' (1.829 m)   Wt 88 kg   SpO2 98%   BMI 26.31 kg/m   Physical Exam Constitutional:      Appearance: He is well-developed.  HENT:     Head: Normocephalic and atraumatic.  Eyes:     Conjunctiva/sclera: Conjunctivae normal.  Cardiovascular:     Rate and Rhythm: Normal rate.  Pulmonary:     Effort: Pulmonary effort is normal. No respiratory distress.  Musculoskeletal:        General: Normal range of motion.     Cervical back: Normal range of motion.     Comments: No tenderness along the shoulders, cervical spine, thoracic spine, lumbar spine, hips and knees.  He is tender along the right posterior ribs along T10-T12.  No step-off, ecchymosis.  No significant swelling.  Skin:    General: Skin  is warm.     Findings: No rash.  Neurological:     General: No focal deficit present.     Mental Status: He is alert. Mental status is at baseline. He is disoriented.  Psychiatric:        Behavior: Behavior normal.        Thought Content: Thought content normal.     ED Results / Procedures / Treatments  Labs (all labs ordered are listed, but only abnormal results are displayed) Labs Reviewed - No data to display  EKG None  Radiology DG Ribs Unilateral W/Chest Right  Result Date: 05/17/2019 CLINICAL DATA:  Pain status post fall EXAM: RIGHT RIBS AND CHEST - 3+ VIEW COMPARISON:  None. FINDINGS: The heart size is normal. There is no pneumothorax. No large pleural effusion. There is an airspace opacity at the right lung base overlying the right eighth rib posterolaterally. There is no evidence for displaced rib fracture. IMPRESSION: 1. No acute displaced fracture.  No pneumothorax. 2. Airspace opacity the right lung base. A 4-6 week follow-up two-view chest x-ray is recommended to confirm stability or resolution of this finding. Electronically Signed   By: Constance Holster M.D.   On: 05/17/2019 20:07    Procedures Procedures (including critical care time)  Medications Ordered in ED Medications  oxyCODONE-acetaminophen (PERCOCET/ROXICET) 5-325 MG per tablet 1 tablet (1 tablet Oral Given 05/17/19 2040)    ED Course  I have reviewed the triage vital signs and the nursing notes.  Pertinent labs & imaging results that were available during my care of the patient were reviewed by me and considered in my medical decision making (see chart for details).    MDM Rules/Calculators/A&P                      69 year old male with right rib contusion.  Chest x-ray shows no pneumothorax nor rib fractures.  Vital signs are stable.  He is given oxycodone for pain.  He is educated on home treatment as well as signs and symptoms return to ED for.  He denies any other injury to his body.  Exam is  unremarkable for any other injury. Final Clinical Impression(s) / ED Diagnoses Final diagnoses:  Fall  Rib contusion, right, initial encounter    Rx / DC Orders ED Discharge Orders         Ordered    oxyCODONE (ROXICODONE) 5 MG immediate release tablet  Every 6 hours PRN     05/17/19 2048           Duanne Guess, PA-C 05/17/19 2056    Blake Divine, MD 05/18/19 0003

## 2019-05-17 NOTE — ED Triage Notes (Signed)
Pt to ED from home c/o fall tonight, mechanical, slipped on a mat in bathtub, denies LOC.  Pain to right ribs and back.

## 2019-05-17 NOTE — Discharge Instructions (Addendum)
Please take oxycodone as needed for pain.  You may use Tylenol 1000 mg every 6 hours for additional pain relief.  Apply ice to the right ribs 20 minutes every hour for the next 2 to 3 days.  If any worsening pain, swelling, difficulty breathing, shortness of breath or any urgent changes in your health please return to the emergency department.  Radiologist recommends he have a repeat chest x-ray in 4 to 6 weeks due to unknown airspace opacity in the lung.  Radiologist will to make sure this resolves in 4 to 6 weeks.  Please follow-up with your primary care provider for repeat x-ray.

## 2019-05-24 ENCOUNTER — Ambulatory Visit (INDEPENDENT_AMBULATORY_CARE_PROVIDER_SITE_OTHER): Payer: PPO

## 2019-05-24 ENCOUNTER — Ambulatory Visit (INDEPENDENT_AMBULATORY_CARE_PROVIDER_SITE_OTHER): Payer: PPO | Admitting: Emergency Medicine

## 2019-05-24 ENCOUNTER — Other Ambulatory Visit: Payer: Self-pay

## 2019-05-24 ENCOUNTER — Encounter: Payer: Self-pay | Admitting: Emergency Medicine

## 2019-05-24 VITALS — BP 145/83 | HR 71 | Temp 97.9°F | Resp 16 | Ht 71.75 in | Wt 199.0 lb

## 2019-05-24 DIAGNOSIS — S20211S Contusion of right front wall of thorax, sequela: Secondary | ICD-10-CM

## 2019-05-24 DIAGNOSIS — S20211A Contusion of right front wall of thorax, initial encounter: Secondary | ICD-10-CM | POA: Diagnosis not present

## 2019-05-24 DIAGNOSIS — S299XXA Unspecified injury of thorax, initial encounter: Secondary | ICD-10-CM | POA: Diagnosis not present

## 2019-05-24 NOTE — Patient Instructions (Addendum)
If you have lab work done today you will be contacted with your lab results within the next 2 weeks.  If you have not heard from [place] then please contact us. The fastest way to get your results is to register for My Chart.   IF you received an x-ray today, you will receive an invoice from [facility]. Please contact [facility] at [phone] with questions or concerns regarding your invoice.   IF you received labwork today, you will receive an invoice from [facility]. Please contact [facility] at [phone] with questions or concerns regarding your invoice.   Our billing staff will not be able to assist you with questions regarding bills from these companies.  You will be contacted with the lab results as soon as they are available. The fastest way to get your results is to activate your My Chart account. Instructions are located on the last page of this paperwork. If you have not heard from [place] regarding the results in 2 weeks, please contact this office.     Rib Contusion A rib contusion is a deep bruise on your rib area. Contusions are the result of a blunt trauma that causes bleeding and injury to the tissues under the skin. A rib contusion may involve bruising of the ribs and of the skin and muscles in the area. The skin over the contusion may turn blue, purple, or yellow. Minor injuries will give you a painless contusion. More severe contusions may stay painful and swollen for a few weeks. What are the causes? This condition is usually caused by a blow, trauma, or direct force to an area of the body. This often occurs while playing contact sports. What are the signs or symptoms? Symptoms of this condition include:  Swelling and redness of the injured area.  Discoloration of the injured area.  Tenderness and soreness of the injured area.  Pain with or without movement. How is this diagnosed? This condition may be diagnosed based on:  Your symptoms and medical  history.  A physical exam.  Imaging tests--such as an X-ray, CT scan, or MRI--to determine if there were internal injuries or broken bones (fractures). How is this treated? This condition may be treated with:  Rest. This is often the best treatment for a rib contusion.  Icing. This reduces swelling and inflammation.  Deep-breathing exercises. These may be recommended to reduce the risk for lung collapse and pneumonia.  Medicines. Over-the-counter or prescription medicines may be given to control pain.  Injection of a numbing medicine around the nerve near your injury (nerve block). Follow these instructions at home:     Medicines  Take over-the-counter and prescription medicines only as told by your health care provider.  Do not drive or use heavy machinery while taking prescription pain medicine.  If you are taking prescription pain medicine, take actions to prevent or treat constipation. Your health care provider may recommend that you: ? Drink enough fluid to keep your urine pale yellow. ? Eat foods that are high in fiber, such as fresh fruits and vegetables, whole grains, and beans. ? Limit foods that are high in fat and processed sugars, such as fried or sweet foods. ? Take an over-the-counter or prescription medicine for constipation. Managing pain, stiffness, and swelling  If directed, put ice on the injured area: ? Put ice in a plastic bag. ? Place a towel between your skin and the bag. ? Leave the ice on for 20 minutes, 2-3 times a  day.  Rest the injured area. Avoid strenuous activity and any activities or movements that cause pain. Be careful during activities and avoid bumping the injured area.  Do not lift anything that is heavier than 5 lb (2.3 kg), or the limit that you are told, until your health care provider says that it is safe. General instructions  Do not use any products that contain nicotine or tobacco, such as cigarettes and e-cigarettes. These can  delay healing. If you need help quitting, ask your health care provider.  Do deep-breathing exercises as told by your health care provider.  If you were given an incentive spirometer, use it every 1-2 hours while you are awake, or as recommended by your health care provider. This device measures how well you are filling your lungs with each breath.  Keep all follow-up visits as told by your health care provider. This is important. Contact a health care provider if you have:  Increased bruising or swelling.  Pain that is not controlled with treatment.  A fever. Get help right away if you:  Have difficulty breathing or shortness of breath.  Develop a continual cough or you cough up thick or bloody sputum.  Feel nauseous or you vomit.  Have pain in your abdomen. Summary  A rib contusion is a deep bruise on your rib area. Contusions are the result of a blunt trauma that causes bleeding and injury to the tissues under the skin.  The skin overlying the contusion may turn blue, purple, or yellow. Minor injuries may give you a painless contusion. More severe contusions may stay painful and swollen for a few weeks.  Rest the injured area. Avoid strenuous activity and any activities or movements that cause pain. This information is not intended to replace advice given to you by your health care provider. Make sure you discuss any questions you have with your health care provider. Document Revised: 03/26/2017 Document Reviewed: 03/26/2017 Elsevier Patient Education  2020 [employer].

## 2019-05-24 NOTE — Progress Notes (Signed)
Tyrone Curtis 69 y.o.   Chief Complaint  Patient presents with  . Back Injury    Right side of back   . Fall    x 1 week ago in the tub - patient went to ED 05/17/19    HISTORY OF PRESENT ILLNESS: This is a 69 y.o. male here for follow-up of ED visit on 05/17/2019. Seen after a fall during which he sustained trauma to right side of his chest. Chest x-ray did not show obvious rib fracture however it did show some abnormality.  Denies any difficulty breathing.  Off pain medication.  No new symptoms or new medical concerns today.  ED physician recommended to have repeat x-ray in a few weeks.  DG Ribs Unilateral W/Chest Right  Result Date: 05/17/2019 CLINICAL DATA:  Pain status post fall EXAM: RIGHT RIBS AND CHEST - 3+ VIEW COMPARISON:  None. FINDINGS: The heart size is normal. There is no pneumothorax. No large pleural effusion. There is an airspace opacity at the right lung base overlying the right eighth rib posterolaterally. There is no evidence for displaced rib fracture. IMPRESSION: 1. No acute displaced fracture.  No pneumothorax. 2. Airspace opacity the right lung base. A 4-6 week follow-up two-view chest x-ray is recommended to confirm stability or resolution of this finding. Electronically Signed   By: Constance Holster M.D.   On: 05/17/2019 20:07    HPI   Prior to Admission medications   Medication Sig Start Date End Date Taking? Authorizing Provider  Acetaminophen (TYLENOL PO) Take by mouth as needed.   Yes [provider]  ASPIRIN 81 PO Take 81 mg by mouth 2 (two) times daily.   Yes [provider]  atorvastatin (LIPITOR) 80 MG tablet TAKE 1 TABLET(40 MG) BY MOUTH DAILY 03/03/19  Yes Danen Lapaglia, Ines Bloomer, MD  loratadine (CLARITIN) 10 MG tablet Take 10 mg by mouth daily.   Yes [provider]  Multiple Vitamin (MULTIVITAMINS PO) Take by mouth daily.   Yes [provider]  omeprazole (PRILOSEC) 40 MG capsule TAKE 1 CAPSULE(40 MG) BY MOUTH  DAILY 03/03/19  Yes Ella Golomb, Ines Bloomer, MD  oxyCODONE (ROXICODONE) 5 MG immediate release tablet Take 1 tablet (5 mg total) by mouth every 6 (six) hours as needed. Patient not taking: Reported on 05/24/2019 05/17/19 05/16/20  Duanne Guess, PA-C  sertraline (ZOLOFT) 25 MG tablet Take 0.5 tablets (12.5 mg total) by mouth daily. Stop medication once your are out of this Rx. 02/09/19   Hubbard Hartshorn, FNP    Allergies  Allergen Reactions  . Zyrtec D [Cetirizine-Pseudoephedrine Er]     "talking out of his head"    Patient Active Problem List   Diagnosis Date Noted  . Prediabetes 01/02/2018  . Overweight (BMI 25.0-29.9) 01/02/2018  . History of basal cell carcinoma 08/12/2017  . Paroxysmal tachycardia ([place]) 08/23/2016  . Smoking history 08/23/2016  . Adjustment disorder with mixed anxiety and depressed mood 07/16/2016  . Hyperlipidemia 01/06/2014  . Gastroesophageal reflux disease without esophagitis 01/06/2014  . Hemiparesis affecting left side as late effect of cerebrovascular accident (CVA) ([place]) 01/06/2014  . Insomnia 07/26/2013  . Erectile dysfunction 12/16/2011    Past Medical History:  Diagnosis Date  . Allergic rhinitis, cause unspecified   . Allergy   . Anxiety state, unspecified   . Arthritis   . Basal cell carcinoma    facial  . Cataract   . Chicken pox   . CVA (cerebral vascular accident) ([place])   .  Depression   . 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 Dysphagia, unspecified(787.20)   . Erectile dysfunction   . Herpes zoster without mention of complication   . Internal hemorrhoids without mention of complication   . Measles   . Mumps    x 2  . Personal history of colonic polyps   . Pure hypercholesterolemia   . Regurgitation     Past Surgical History:  Procedure Laterality Date  . basal cell carcinoma of anterior chest  2000   resection; [facility]  . cataract surgery  2010   with lens repacement Left  . COLONOSCOPY  09/23/12    normal.  Symptoms: anemia, hemoccult+.  [facility].  . ESOPHAGOGASTRODUODENOSCOPY  09/23/12   normal.  Symptoms: anemia, hemoccult +, GERD.  Marland Kitchen EYE SURGERY Bilateral    cataract  . FRACTURE SURGERY Left    1957 left arm  . small bowel mass  1991   removed part of small intestine benign mass  . SMALL INTESTINE SURGERY      Social History   Socioeconomic History  . Marital status: Married    Spouse name: [name]  . Number of children: 1  . Years of education: Not on file  . Highest education level: Not on file  Occupational History  . Occupation: Disability    Comment: CVA  . Occupation: Previous Financial controller of [employer]  Tobacco Use  . Smoking status: Former Smoker    Packs/day: 2.00    Years: 25.00    Pack years: 50.00    Types: Cigarettes  . Smokeless tobacco: Never Used  . Tobacco comment: quit in 1984  Substance and Sexual Activity  . Alcohol use: Yes    Alcohol/week: 6.0 standard drinks    Types: 6 Cans of beer per week    Comment: 6 drinks/week  . Drug use: No  . Sexual activity: Yes  Other Topics Concern  . Not on file  Social History Narrative   Marital status:  Married x 43 years      Children: 1 daughte (45)r; 1 grandson (51)      Lives: with wife; has mountain house and [place] house at [employer]      Employment: disabled since CVA      Tobacco: none      Alcohol:  1-2 drinks per night      Drugs:  None      Exercise: very active in the yard. No formal exercise program in 2019.      ADLs: independent with ADLs; drives locally; maintains own yard.        Advanced Directives:  NONE in 2019.  FULL CODE; no prolonged measures.         Social Determinants of Health   Financial Resource Strain:   . Difficulty of Paying Living Expenses:   Food Insecurity: No Food Insecurity  . Worried About Charity fundraiser in the Last Year: Never true  . Ran Out of Food in the Last Year: Never true  Transportation Needs:   . Lack of Transportation (Medical):   Marland Kitchen  Lack of Transportation (Non-Medical):   Physical Activity:   . Days of Exercise per Week:   . Minutes of Exercise per Session:   Stress:   . Feeling of Stress :   Social Connections:   . Frequency of Communication with Friends and Family:   . Frequency of Social Gatherings with Friends and Family:   . Attends Religious Services:   .  Active Member of Clubs or Organizations:   . Attends Archivist Meetings:   Marland Kitchen Marital Status:   Intimate Partner Violence:   . Fear of Current or Ex-Partner:   . Emotionally Abused:   Marland Kitchen Physically Abused:   . Sexually Abused:     Family History  Problem Relation Age of Onset  . Hyperlipidemia Sister   . Mental illness Sister        anxiety  . Liver disease Sister   . Hyperlipidemia Brother   . Diabetes Brother   . Arthritis Brother        Knee replacement  . Bipolar disorder Brother   . Liver disease Brother   . Heart disease Brother 64       AMI/CAD with stenting  . Diabetes Mother   . Depression Mother   . Esophageal varices Mother   . Hyperlipidemia Mother   . Mental illness Mother   . Liver disease Mother   . Cancer Father        unknown     Review of Systems  Constitutional: Negative.  Negative for chills and fever.  HENT: Negative.  Negative for congestion and sore throat.   Respiratory: Negative.  Negative for cough and shortness of breath.   Cardiovascular: Negative.  Negative for chest pain and palpitations.  Gastrointestinal: Negative.  Negative for abdominal pain, blood in stool, diarrhea, melena, nausea and vomiting.  Genitourinary: Negative.  Negative for dysuria and hematuria.  Musculoskeletal: Negative.  Negative for back pain, myalgias and neck pain.  Skin: Negative.  Negative for rash.  Neurological: Negative.  Negative for dizziness and headaches.  All other systems reviewed and are negative.  Vitals:   05/24/19 1526  BP: (!) 145/83  Pulse: 71  Resp: 16  Temp: 97.9 F (36.6 C)  SpO2: 97%      Physical Exam Vitals reviewed.  Constitutional:      Appearance: Normal appearance.  HENT:     Head: Normocephalic.  Eyes:     Extraocular Movements: Extraocular movements intact.     Conjunctiva/sclera: Conjunctivae normal.     Pupils: Pupils are equal, round, and reactive to light.  Cardiovascular:     Rate and Rhythm: Normal rate and regular rhythm.     Pulses: Normal pulses.     Heart sounds: Normal heart sounds.  Pulmonary:     Effort: Pulmonary effort is normal.     Breath sounds: Normal breath sounds.  Musculoskeletal:     Cervical back: Normal range of motion and neck supple.  Skin:    General: Skin is warm and dry.     Capillary Refill: Capillary refill takes less than 2 seconds.     Comments: Visible old bruising to right flank area.  No crepitation felt.  No significant tenderness.  Neurological:     General: No focal deficit present.     Mental Status: He is alert and oriented to person, place, and time.  Psychiatric:        Mood and Affect: Mood normal.        Behavior: Behavior normal.     DG Chest 2 View  Result Date: 05/24/2019 CLINICAL DATA:  Blunt chest trauma to the right side EXAM: CHEST - 2 VIEW COMPARISON:  05/17/2019 FINDINGS: The heart size and mediastinal contours are within normal limits. Both lungs are clear. The visualized skeletal structures are unremarkable. Mild aortic atherosclerosis. IMPRESSION: No active cardiopulmonary disease. Electronically Signed   By: Madie Reno.D.  On: 05/24/2019 16:21    ASSESSMENT & PLAN: Gonzalo was seen today for back injury and fall.  Diagnoses and all orders for this visit:  Chest wall contusion, right, sequela -     DG Chest 2 View   Clinically stable.  No medical concerns identified during this visit.   Patient Instructions       If you have lab work done today you will be contacted with your lab results within the next 2 weeks.  If you have not heard from [place] then please contact us.  The fastest way to get your results is to register for My Chart.   IF you received an x-ray today, you will receive an invoice from [facility]. Please contact [facility] at [phone] with questions or concerns regarding your invoice.   IF you received labwork today, you will receive an invoice from [facility]. Please contact [facility] at [phone] with questions or concerns regarding your invoice.   Our billing staff will not be able to assist you with questions regarding bills from these companies.  You will be contacted with the lab results as soon as they are available. The fastest way to get your results is to activate your My Chart account. Instructions are located on the last page of this paperwork. If you have not heard from [place] regarding the results in 2 weeks, please contact this office.     Rib Contusion A rib contusion is a deep bruise on your rib area. Contusions are the result of a blunt trauma that causes bleeding and injury to the tissues under the skin. A rib contusion may involve bruising of the ribs and of the skin and muscles in the area. The skin over the contusion may turn blue, purple, or yellow. Minor injuries will give you a painless contusion. More severe contusions may stay painful and swollen for a few weeks. What are the causes? This condition is usually caused by a blow, trauma, or direct force to an area of the body. This often occurs while playing contact sports. What are the signs or symptoms? Symptoms of this condition include:  Swelling and redness of the injured area.  Discoloration of the injured area.  Tenderness and soreness of the injured area.  Pain with or without movement. How is this diagnosed? This condition may be diagnosed based on:  Your symptoms and medical history.  A physical exam.  Imaging tests--such as an X-ray, CT scan, or MRI--to determine if there were internal injuries or broken bones (fractures). How is  this treated? This condition may be treated with:  Rest. This is often the best treatment for a rib contusion.  Icing. This reduces swelling and inflammation.  Deep-breathing exercises. These may be recommended to reduce the risk for lung collapse and pneumonia.  Medicines. Over-the-counter or prescription medicines may be given to control pain.  Injection of a numbing medicine around the nerve near your injury (nerve block). Follow these instructions at home:     Medicines  Take over-the-counter and prescription medicines only as told by your health care provider.  Do not drive or use heavy machinery while taking prescription pain medicine.  If you are taking prescription pain medicine, take actions to prevent or treat constipation. Your health care provider may recommend that you: ? Drink enough fluid to keep your urine pale yellow. ? Eat foods that are high in fiber, such as fresh fruits and vegetables, whole grains, and beans. ? Limit foods that are high in  fat and processed sugars, such as fried or sweet foods. ? Take an over-the-counter or prescription medicine for constipation. Managing pain, stiffness, and swelling  If directed, put ice on the injured area: ? Put ice in a plastic bag. ? Place a towel between your skin and the bag. ? Leave the ice on for 20 minutes, 2-3 times a day.  Rest the injured area. Avoid strenuous activity and any activities or movements that cause pain. Be careful during activities and avoid bumping the injured area.  Do not lift anything that is heavier than 5 lb (2.3 kg), or the limit that you are told, until your health care provider says that it is safe. General instructions  Do not use any products that contain nicotine or tobacco, such as cigarettes and e-cigarettes. These can delay healing. If you need help quitting, ask your health care provider.  Do deep-breathing exercises as told by your health care provider.  If you were given an  incentive spirometer, use it every 1-2 hours while you are awake, or as recommended by your health care provider. This device measures how well you are filling your lungs with each breath.  Keep all follow-up visits as told by your health care provider. This is important. Contact a health care provider if you have:  Increased bruising or swelling.  Pain that is not controlled with treatment.  A fever. Get help right away if you:  Have difficulty breathing or shortness of breath.  Develop a continual cough or you cough up thick or bloody sputum.  Feel nauseous or you vomit.  Have pain in your abdomen. Summary  A rib contusion is a deep bruise on your rib area. Contusions are the result of a blunt trauma that causes bleeding and injury to the tissues under the skin.  The skin overlying the contusion may turn blue, purple, or yellow. Minor injuries may give you a painless contusion. More severe contusions may stay painful and swollen for a few weeks.  Rest the injured area. Avoid strenuous activity and any activities or movements that cause pain. This information is not intended to replace advice given to you by your health care provider. Make sure you discuss any questions you have with your health care provider. Document Revised: 03/26/2017 Document Reviewed: 03/26/2017 Elsevier Patient Education  2020 [employer]      Agustina Caroli, MD [facility]

## 2019-06-03 DIAGNOSIS — C44629 Squamous cell carcinoma of skin of left upper limb, including shoulder: Secondary | ICD-10-CM | POA: Diagnosis not present

## 2019-06-16 DIAGNOSIS — D045 Carcinoma in situ of skin of trunk: Secondary | ICD-10-CM | POA: Diagnosis not present

## 2019-08-11 ENCOUNTER — Ambulatory Visit (INDEPENDENT_AMBULATORY_CARE_PROVIDER_SITE_OTHER): Payer: PPO | Admitting: Emergency Medicine

## 2019-08-11 ENCOUNTER — Encounter: Payer: Self-pay | Admitting: Emergency Medicine

## 2019-08-11 ENCOUNTER — Other Ambulatory Visit: Payer: Self-pay

## 2019-08-11 VITALS — BP 130/82 | HR 67 | Temp 97.9°F | Resp 15 | Ht 71.75 in | Wt 197.0 lb

## 2019-08-11 DIAGNOSIS — Z0000 Encounter for general adult medical examination without abnormal findings: Secondary | ICD-10-CM

## 2019-08-11 DIAGNOSIS — Z130 Encounter for screening for diseases of the blood and blood-forming organs and certain disorders involving the immune mechanism: Secondary | ICD-10-CM | POA: Diagnosis not present

## 2019-08-11 DIAGNOSIS — Z13220 Encounter for screening for lipoid disorders: Secondary | ICD-10-CM

## 2019-08-11 DIAGNOSIS — Z13228 Encounter for screening for other metabolic disorders: Secondary | ICD-10-CM

## 2019-08-11 DIAGNOSIS — Z1329 Encounter for screening for other suspected endocrine disorder: Secondary | ICD-10-CM

## 2019-08-11 NOTE — Progress Notes (Signed)
Tyrone Curtis 69 y.o.   Chief Complaint  Patient presents with  . Annual Exam    pt is doing okay states he has minimal concern but that wife has several at this time pt would just like to have his previous rib injury reviewed     HISTORY OF PRESENT ILLNESS: This is a 69 y.o. male here for annual exam. Has the following chronic medical problems: 1.  Status post CVA 2012.  Sees neurologist, Dr. Brigitte Pulse, regularly. 2.  Hyperlipidemia: On Lipitor 80 mg daily. 3.  History of GERD, on Prilosec 40 mg daily. 4.  History of depression/anxiety but doing much better: Off medication. Fully vaccinated against Covid. Has history of chronic EtOH use but stopped completely 1 week ago.  Family history of alcoholism. Stays very physically active and eats well. Has no complaints or medical concerns today. Fully healed from right-sided rib cage contusion sustained several months ago.  HPI   Prior to Admission medications   Medication Sig Start Date End Date Taking? Authorizing Provider  Acetaminophen (TYLENOL PO) Take by mouth as needed.   Yes [provider]  ASPIRIN 81 PO Take 81 mg by mouth 2 (two) times daily.   Yes [provider]  atorvastatin (LIPITOR) 80 MG tablet TAKE 1 TABLET(40 MG) BY MOUTH DAILY 03/03/19  Yes Leauna Sharber, Ines Bloomer, MD  loratadine (CLARITIN) 10 MG tablet Take 10 mg by mouth daily.   Yes [provider]  melatonin 5 MG TABS Take 5 mg by mouth.   Yes [provider]  Multiple Vitamin (MULTIVITAMINS PO) Take by mouth daily.   Yes [provider]  omeprazole (PRILOSEC) 40 MG capsule TAKE 1 CAPSULE(40 MG) BY MOUTH DAILY 03/03/19  Yes Latayvia Mandujano, Ines Bloomer, MD    Allergies  Allergen Reactions  . Zyrtec D [Cetirizine-Pseudoephedrine Er]     "talking out of his head"    Patient Active Problem List   Diagnosis Date Noted  . Prediabetes 01/02/2018  . Overweight (BMI 25.0-29.9) 01/02/2018  . History of basal cell carcinoma  08/12/2017  . Paroxysmal tachycardia ([place]) 08/23/2016  . Smoking history 08/23/2016  . Adjustment disorder with mixed anxiety and depressed mood 07/16/2016  . Hyperlipidemia 01/06/2014  . Gastroesophageal reflux disease without esophagitis 01/06/2014  . Hemiparesis affecting left side as late effect of cerebrovascular accident (CVA) ([place]) 01/06/2014  . Insomnia 07/26/2013  . Erectile dysfunction 12/16/2011    Past Medical History:  Diagnosis Date  . Allergic rhinitis, cause unspecified   . Allergy   . Anxiety state, unspecified   . Arthritis   . Basal cell carcinoma    facial  . Cataract   . Chicken pox   . CVA (cerebral vascular accident) ([place])   . Depression   . 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 Dysphagia, unspecified(787.20)   . Erectile dysfunction   . Herpes zoster without mention of complication   . Internal hemorrhoids without mention of complication   . Measles   . Mumps    x 2  . Personal history of colonic polyps   . Pure hypercholesterolemia   . Regurgitation     Past Surgical History:  Procedure Laterality Date  . basal cell carcinoma of anterior chest  2000   resection; [facility]  . cataract surgery  2010   with lens repacement Left  . COLONOSCOPY  09/23/12   normal.  Symptoms: anemia, hemoccult+.  [facility].  . ESOPHAGOGASTRODUODENOSCOPY  09/23/12   normal.  Symptoms: anemia, hemoccult +, GERD.  Marland Kitchen EYE SURGERY Bilateral    cataract  . FRACTURE SURGERY Left    1957 left arm  . small bowel mass  1991   removed part of small intestine benign mass  . SMALL INTESTINE SURGERY      Social History   Socioeconomic History  . Marital status: Married    Spouse name: [name]  . Number of children: 1  . Years of education: Not on file  . Highest education level: Not on file  Occupational History  . Occupation: Disability    Comment: CVA  . Occupation: Previous Financial controller of [employer]  Tobacco Use  . Smoking status:  Former Smoker    Packs/day: 2.00    Years: 25.00    Pack years: 50.00    Types: Cigarettes  . Smokeless tobacco: Never Used  . Tobacco comment: quit in 1984  Substance and Sexual Activity  . Alcohol use: Yes    Alcohol/week: 6.0 standard drinks    Types: 6 Cans of beer per week    Comment: 6 drinks/week  . Drug use: No  . Sexual activity: Yes  Other Topics Concern  . Not on file  Social History Narrative   Marital status:  Married x 43 years      Children: 1 daughte (45)r; 1 grandson (106)      Lives: with wife; has mountain house and [place] house at [employer]      Employment: disabled since CVA      Tobacco: none      Alcohol:  1-2 drinks per night      Drugs:  None      Exercise: very active in the yard. No formal exercise program in 2019.      ADLs: independent with ADLs; drives locally; maintains own yard.        Advanced Directives:  NONE in 2019.  FULL CODE; no prolonged measures.         Social Determinants of Health   Financial Resource Strain:   . Difficulty of Paying Living Expenses:   Food Insecurity: No Food Insecurity  . Worried About Charity fundraiser in the Last Year: Never true  . Ran Out of Food in the Last Year: Never true  Transportation Needs:   . Lack of Transportation (Medical):   Marland Kitchen Lack of Transportation (Non-Medical):   Physical Activity:   . Days of Exercise per Week:   . Minutes of Exercise per Session:   Stress:   . Feeling of Stress :   Social Connections:   . Frequency of Communication with Friends and Family:   . Frequency of Social Gatherings with Friends and Family:   . Attends Religious Services:   . Active Member of Clubs or Organizations:   . Attends Archivist Meetings:   Marland Kitchen Marital Status:   Intimate Partner Violence:   . Fear of Current or Ex-Partner:   . Emotionally Abused:   Marland Kitchen Physically Abused:   . Sexually Abused:     Family History  Problem Relation Age of Onset  . Hyperlipidemia Sister   . Mental  illness Sister        anxiety  . Liver disease Sister   . Hyperlipidemia Brother   . Diabetes Brother   . Arthritis Brother        Knee replacement  . Bipolar disorder Brother   . Liver disease Brother   . Heart disease Brother 45  AMI/CAD with stenting  . Diabetes Mother   . Depression Mother   . Esophageal varices Mother   . Hyperlipidemia Mother   . Mental illness Mother   . Liver disease Mother   . Cancer Father        unknown     Review of Systems  Constitutional: Negative.  Negative for chills and fever.  HENT: Negative.  Negative for congestion and sore throat.   Respiratory: Negative.  Negative for cough and shortness of breath.   Cardiovascular: Negative.  Negative for chest pain and palpitations.  Gastrointestinal: Negative.  Negative for abdominal pain, blood in stool, diarrhea, melena, nausea and vomiting.  Genitourinary: Negative.  Negative for dysuria and hematuria.  Musculoskeletal: Negative.   Skin: Negative.  Negative for rash.  Neurological: Negative.  Negative for dizziness and headaches.  Endo/Heme/Allergies: Negative.   All other systems reviewed and are negative.  Vitals:   08/11/19 0803  BP: 130/82  Pulse: 67  Resp: 15  Temp: 97.9 F (36.6 C)  SpO2: 98%     Physical Exam Vitals reviewed.  Constitutional:      Appearance: Normal appearance.  HENT:     Head: Normocephalic.  Eyes:     Extraocular Movements: Extraocular movements intact.     Conjunctiva/sclera: Conjunctivae normal.     Pupils: Pupils are equal, round, and reactive to light.  Cardiovascular:     Rate and Rhythm: Normal rate and regular rhythm.     Pulses: Normal pulses.     Heart sounds: Normal heart sounds.  Pulmonary:     Effort: Pulmonary effort is normal.     Breath sounds: Normal breath sounds.  Abdominal:     General: Bowel sounds are normal. There is no distension.     Palpations: Abdomen is soft.     Tenderness: There is no abdominal tenderness.    Musculoskeletal:        General: Normal range of motion.     Cervical back: Normal range of motion and neck supple.  Skin:    General: Skin is warm and dry.     Capillary Refill: Capillary refill takes less than 2 seconds.  Neurological:     General: No focal deficit present.     Mental Status: He is alert and oriented to person, place, and time.  Psychiatric:        Mood and Affect: Mood normal.        Behavior: Behavior normal.      ASSESSMENT & PLAN: Bayne was seen today for annual exam.  Diagnoses and all orders for this visit:  Routine general medical examination at a health care facility  Screening for deficiency anemia -     CBC with Differential  Screening for lipoid disorders -     Lipid panel  Screening for endocrine, metabolic and immunity disorder -     Comprehensive metabolic panel -     Hemoglobin A1c    Patient Instructions       If you have lab work done today you will be contacted with your lab results within the next 2 weeks.  If you have not heard from [place] then please contact us. The fastest way to get your results is to register for My Chart.   IF you received an x-ray today, you will receive an invoice from [facility]. Please contact [facility] at [phone] with questions or concerns regarding your invoice.   IF you received labwork today, you will receive an invoice  from [facility]. Please contact [facility] at [phone] with questions or concerns regarding your invoice.   Our billing staff will not be able to assist you with questions regarding bills from these companies.  You will be contacted with the lab results as soon as they are available. The fastest way to get your results is to activate your My Chart account. Instructions are located on the last page of this paperwork. If you have not heard from [place] regarding the results in 2 weeks, please contact this office.      Health Maintenance, Male Adopting a  healthy lifestyle and getting preventive care are important in promoting health and wellness. Ask your health care provider about:  The right schedule for you to have regular tests and exams.  Things you can do on your own to prevent diseases and keep yourself healthy. What should I know about diet, weight, and exercise? Eat a healthy diet   Eat a diet that includes plenty of vegetables, fruits, low-fat dairy products, and lean protein.  Do not eat a lot of foods that are high in solid fats, added sugars, or sodium. Maintain a healthy weight Body mass index (BMI) is a measurement that can be used to identify possible weight problems. It estimates body fat based on height and weight. Your health care provider can help determine your BMI and help you achieve or maintain a healthy weight. Get regular exercise Get regular exercise. This is one of the most important things you can do for your health. Most adults should:  Exercise for at least 150 minutes each week. The exercise should increase your heart rate and make you sweat (moderate-intensity exercise).  Do strengthening exercises at least twice a week. This is in addition to the moderate-intensity exercise.  Spend less time sitting. Even light physical activity can be beneficial. Watch cholesterol and blood lipids Have your blood tested for lipids and cholesterol at 69 years of age, then have this test every 5 years. You may need to have your cholesterol levels checked more often if:  Your lipid or cholesterol levels are high.  You are older than 71 years of age.  You are at high risk for heart disease. What should I know about cancer screening? Many types of cancers can be detected early and may often be prevented. Depending on your health history and family history, you may need to have cancer screening at various ages. This may include screening for:  Colorectal cancer.  Prostate cancer.  Skin cancer.  Lung cancer. What  should I know about heart disease, diabetes, and high blood pressure? Blood pressure and heart disease  High blood pressure causes heart disease and increases the risk of stroke. This is more likely to develop in people who have high blood pressure readings, are of African descent, or are overweight.  Talk with your health care provider about your target blood pressure readings.  Have your blood pressure checked: ? Every 3-5 years if you are 51-39 years of age. ? Every year if you are 73 years old or older.  If you are between the ages of 26 and 35 and are a current or former smoker, ask your health care provider if you should have a one-time screening for abdominal aortic aneurysm (AAA). Diabetes Have regular diabetes screenings. This checks your fasting blood sugar level. Have the screening done:  Once every three years after age 71 if you are at a normal weight and have a low risk for diabetes.  More often and at a younger age if you are overweight or have a high risk for diabetes. What should I know about preventing infection? Hepatitis B If you have a higher risk for hepatitis B, you should be screened for this virus. Talk with your health care provider to find out if you are at risk for hepatitis B infection. Hepatitis C Blood testing is recommended for:  Everyone born from [birth year] through 1965.  Anyone with known risk factors for hepatitis C. Sexually transmitted infections (STIs)  You should be screened each year for STIs, including gonorrhea and chlamydia, if: ? You are sexually active and are younger than 71 years of age. ? You are older than 71 years of age and your health care provider tells you that you are at risk for this type of infection. ? Your sexual activity has changed since you were last screened, and you are at increased risk for chlamydia or gonorrhea. Ask your health care provider if you are at risk.  Ask your health care provider about whether you are at high  risk for HIV. Your health care provider may recommend a prescription medicine to help prevent HIV infection. If you choose to take medicine to prevent HIV, you should first get tested for HIV. You should then be tested every 3 months for as long as you are taking the medicine. Follow these instructions at home: Lifestyle  Do not use any products that contain nicotine or tobacco, such as cigarettes, e-cigarettes, and chewing tobacco. If you need help quitting, ask your health care provider.  Do not use street drugs.  Do not share needles.  Ask your health care provider for help if you need support or information about quitting drugs. Alcohol use  Do not drink alcohol if your health care provider tells you not to drink.  If you drink alcohol: ? Limit how much you have to 0-2 drinks a day. ? Be aware of how much alcohol is in your drink. In the [place] one drink equals one 12 oz bottle of beer (355 mL), one 5 oz glass of wine (148 mL), or one 1 oz glass of hard liquor (44 mL). General instructions  Schedule regular health, dental, and eye exams.  Stay current with your vaccines.  Tell your health care provider if: ? You often feel depressed. ? You have ever been abused or do not feel safe at home. Summary  Adopting a healthy lifestyle and getting preventive care are important in promoting health and wellness.  Follow your health care provider's instructions about healthy diet, exercising, and getting tested or screened for diseases.  Follow your health care provider's instructions on monitoring your cholesterol and blood pressure. This information is not intended to replace advice given to you by your health care provider. Make sure you discuss any questions you have with your health care provider. Document Revised: 02/18/2018 Document Reviewed: 02/18/2018 Elsevier Patient Education  2020 Elsevier Inc.      Agustina Caroli, MD [facility]

## 2019-08-11 NOTE — Patient Instructions (Addendum)
   If you have lab work done today you will be contacted with your lab results within the next 2 weeks.  If you have not heard from us then please contact us. The fastest way to get your results is to register for My Chart.   IF you received an x-ray today, you will receive an invoice from [facility]. Please contact [facility] at [phone] with questions or concerns regarding your invoice.   IF you received labwork today, you will receive an invoice from [facility]. Please contact [facility] at [phone] with questions or concerns regarding your invoice.   Our billing staff will not be able to assist you with questions regarding bills from these companies.  You will be contacted with the lab results as soon as they are available. The fastest way to get your results is to activate your My Chart account. Instructions are located on the last page of this paperwork. If you have not heard from us regarding the results in 2 weeks, please contact this office.      Health Maintenance, Male Adopting a healthy lifestyle and getting preventive care are important in promoting health and wellness. Ask your health care provider about:  The right schedule for you to have regular tests and exams.  Things you can do on your own to prevent diseases and keep yourself healthy. What should I know about diet, weight, and exercise? Eat a healthy diet   Eat a diet that includes plenty of vegetables, fruits, low-fat dairy products, and lean protein.  Do not eat a lot of foods that are high in solid fats, added sugars, or sodium. Maintain a healthy weight Body mass index (BMI) is a measurement that can be used to identify possible weight problems. It estimates body fat based on height and weight. Your health care provider can help determine your BMI and help you achieve or maintain a healthy weight. Get regular exercise Get regular exercise. This is one of the most important things you  can do for your health. Most adults should:  Exercise for at least 150 minutes each week. The exercise should increase your heart rate and make you sweat (moderate-intensity exercise).  Do strengthening exercises at least twice a week. This is in addition to the moderate-intensity exercise.  Spend less time sitting. Even light physical activity can be beneficial. Watch cholesterol and blood lipids Have your blood tested for lipids and cholesterol at 69 years of age, then have this test every 5 years. You may need to have your cholesterol levels checked more often if:  Your lipid or cholesterol levels are high.  You are older than 71 years of age.  You are at high risk for heart disease. What should I know about cancer screening? Many types of cancers can be detected early and may often be prevented. Depending on your health history and family history, you may need to have cancer screening at various ages. This may include screening for:  Colorectal cancer.  Prostate cancer.  Skin cancer.  Lung cancer. What should I know about heart disease, diabetes, and high blood pressure? Blood pressure and heart disease  High blood pressure causes heart disease and increases the risk of stroke. This is more likely to develop in people who have high blood pressure readings, are of African descent, or are overweight.  Talk with your health care provider about your target blood pressure readings.  Have your blood pressure checked: ? Every 3-5 years if you are 18-39   years of age. ? Every year if you are 40 years old or older.  If you are between the ages of 65 and 75 and are a current or former smoker, ask your health care provider if you should have a one-time screening for abdominal aortic aneurysm (AAA). Diabetes Have regular diabetes screenings. This checks your fasting blood sugar level. Have the screening done:  Once every three years after age 45 if you are at a normal weight and have  a low risk for diabetes.  More often and at a younger age if you are overweight or have a high risk for diabetes. What should I know about preventing infection? Hepatitis B If you have a higher risk for hepatitis B, you should be screened for this virus. Talk with your health care provider to find out if you are at risk for hepatitis B infection. Hepatitis C Blood testing is recommended for:  Everyone born from 1945 through 1965.  Anyone with known risk factors for hepatitis C. Sexually transmitted infections (STIs)  You should be screened each year for STIs, including gonorrhea and chlamydia, if: ? You are sexually active and are younger than 71 years of age. ? You are older than 71 years of age and your health care provider tells you that you are at risk for this type of infection. ? Your sexual activity has changed since you were last screened, and you are at increased risk for chlamydia or gonorrhea. Ask your health care provider if you are at risk.  Ask your health care provider about whether you are at high risk for HIV. Your health care provider may recommend a prescription medicine to help prevent HIV infection. If you choose to take medicine to prevent HIV, you should first get tested for HIV. You should then be tested every 3 months for as long as you are taking the medicine. Follow these instructions at home: Lifestyle  Do not use any products that contain nicotine or tobacco, such as cigarettes, e-cigarettes, and chewing tobacco. If you need help quitting, ask your health care provider.  Do not use street drugs.  Do not share needles.  Ask your health care provider for help if you need support or information about quitting drugs. Alcohol use  Do not drink alcohol if your health care provider tells you not to drink.  If you drink alcohol: ? Limit how much you have to 0-2 drinks a day. ? Be aware of how much alcohol is in your drink. In the [place] one drink equals one 12  oz bottle of beer (355 mL), one 5 oz glass of wine (148 mL), or one 1 oz glass of hard liquor (44 mL). General instructions  Schedule regular health, dental, and eye exams.  Stay current with your vaccines.  Tell your health care provider if: ? You often feel depressed. ? You have ever been abused or do not feel safe at home. Summary  Adopting a healthy lifestyle and getting preventive care are important in promoting health and wellness.  Follow your health care provider's instructions about healthy diet, exercising, and getting tested or screened for diseases.  Follow your health care provider's instructions on monitoring your cholesterol and blood pressure. This information is not intended to replace advice given to you by your health care provider. Make sure you discuss any questions you have with your health care provider. Document Revised: 02/18/2018 Document Reviewed: 02/18/2018 Elsevier Patient Education  2020 Elsevier Inc.  

## 2019-08-12 ENCOUNTER — Encounter: Payer: Self-pay | Admitting: Emergency Medicine

## 2019-08-12 LAB — COMPREHENSIVE METABOLIC PANEL
ALT: 15 IU/L (ref 0–44)
AST: 24 IU/L (ref 0–40)
Albumin/Globulin Ratio: 2 (ref 1.2–2.2)
Albumin: 4.7 g/dL (ref 3.7–4.7)
Alkaline Phosphatase: 99 IU/L (ref 48–121)
BUN/Creatinine Ratio: 9 — ABNORMAL LOW (ref 10–24)
BUN: 10 mg/dL (ref 8–27)
Bilirubin Total: 0.9 mg/dL (ref 0.0–1.2)
CO2: 23 mmol/L (ref 20–29)
Calcium: 9.8 mg/dL (ref 8.6–10.2)
Chloride: 102 mmol/L (ref 96–106)
Creatinine, Ser: 1.1 mg/dL (ref 0.76–1.27)
GFR calc Af Amer: 78 mL/min/{1.73_m2} (ref >59)
GFR calc non Af Amer: 67 mL/min/{1.73_m2} (ref >59)
Globulin, Total: 2.4 g/dL (ref 1.5–4.5)
Glucose: 110 mg/dL — ABNORMAL HIGH (ref 65–99)
Potassium: 4.9 mmol/L (ref 3.5–5.2)
Sodium: 138 mmol/L (ref 134–144)
Total Protein: 7.1 g/dL (ref 6.0–8.5)

## 2019-08-12 LAB — CBC WITH DIFFERENTIAL/PLATELET
Basophils Absolute: 0 10*3/uL (ref 0.0–0.2)
Basos: 1 %
EOS (ABSOLUTE): 0.1 10*3/uL (ref 0.0–0.4)
Eos: 1 %
Hematocrit: 44.7 % (ref 37.5–51.0)
Hemoglobin: 14.8 g/dL (ref 13.0–17.7)
Immature Grans (Abs): 0 10*3/uL (ref 0.0–0.1)
Immature Granulocytes: 0 %
Lymphocytes Absolute: 1.7 10*3/uL (ref 0.7–3.1)
Lymphs: 26 %
MCH: 30.8 pg (ref 26.6–33.0)
MCHC: 33.1 g/dL (ref 31.5–35.7)
MCV: 93 fL (ref 79–97)
Monocytes Absolute: 0.6 10*3/uL (ref 0.1–0.9)
Monocytes: 9 %
Neutrophils Absolute: 4 10*3/uL (ref 1.4–7.0)
Neutrophils: 63 %
Platelets: 221 10*3/uL (ref 150–450)
RBC: 4.8 x10E6/uL (ref 4.14–5.80)
RDW: 12.1 % (ref 11.6–15.4)
WBC: 6.4 10*3/uL (ref 3.4–10.8)

## 2019-08-12 LAB — HEMOGLOBIN A1C
Est. average glucose Bld gHb Est-mCnc: 114 mg/dL
Hgb A1c MFr Bld: 5.6 % (ref 4.8–5.6)

## 2019-08-12 LAB — LIPID PANEL
Chol/HDL Ratio: 4.1 ratio (ref 0.0–5.0)
Cholesterol, Total: 185 mg/dL (ref 100–199)
HDL: 45 mg/dL (ref >39)
LDL Chol Calc (NIH): 117 mg/dL — ABNORMAL HIGH (ref 0–99)
Triglycerides: 131 mg/dL (ref 0–149)
VLDL Cholesterol Cal: 23 mg/dL (ref 5–40)

## 2019-08-27 ENCOUNTER — Other Ambulatory Visit: Payer: Self-pay | Admitting: Emergency Medicine

## 2019-08-27 DIAGNOSIS — E782 Mixed hyperlipidemia: Secondary | ICD-10-CM

## 2019-10-19 DIAGNOSIS — D2262 Melanocytic nevi of left upper limb, including shoulder: Secondary | ICD-10-CM | POA: Diagnosis not present

## 2019-10-19 DIAGNOSIS — D2261 Melanocytic nevi of right upper limb, including shoulder: Secondary | ICD-10-CM | POA: Diagnosis not present

## 2019-10-19 DIAGNOSIS — D2272 Melanocytic nevi of left lower limb, including hip: Secondary | ICD-10-CM | POA: Diagnosis not present

## 2019-10-19 DIAGNOSIS — D225 Melanocytic nevi of trunk: Secondary | ICD-10-CM | POA: Diagnosis not present

## 2019-10-19 DIAGNOSIS — L570 Actinic keratosis: Secondary | ICD-10-CM | POA: Diagnosis not present

## 2019-10-19 DIAGNOSIS — X32XXXA Exposure to sunlight, initial encounter: Secondary | ICD-10-CM | POA: Diagnosis not present

## 2019-10-19 DIAGNOSIS — Z85828 Personal history of other malignant neoplasm of skin: Secondary | ICD-10-CM | POA: Diagnosis not present

## 2019-10-19 DIAGNOSIS — D485 Neoplasm of uncertain behavior of skin: Secondary | ICD-10-CM | POA: Diagnosis not present

## 2019-11-25 DIAGNOSIS — D485 Neoplasm of uncertain behavior of skin: Secondary | ICD-10-CM | POA: Diagnosis not present

## 2019-12-12 ENCOUNTER — Encounter: Payer: Self-pay | Admitting: Emergency Medicine

## 2019-12-21 ENCOUNTER — Ambulatory Visit: Payer: PPO

## 2020-01-03 DIAGNOSIS — H53461 Homonymous bilateral field defects, right side: Secondary | ICD-10-CM | POA: Diagnosis not present

## 2020-01-03 DIAGNOSIS — H53462 Homonymous bilateral field defects, left side: Secondary | ICD-10-CM | POA: Diagnosis not present

## 2020-01-03 DIAGNOSIS — H43813 Vitreous degeneration, bilateral: Secondary | ICD-10-CM | POA: Diagnosis not present

## 2020-01-03 DIAGNOSIS — H524 Presbyopia: Secondary | ICD-10-CM | POA: Diagnosis not present

## 2020-01-03 DIAGNOSIS — H5203 Hypermetropia, bilateral: Secondary | ICD-10-CM | POA: Diagnosis not present

## 2020-01-03 DIAGNOSIS — H52223 Regular astigmatism, bilateral: Secondary | ICD-10-CM | POA: Diagnosis not present

## 2020-01-03 DIAGNOSIS — Z961 Presence of intraocular lens: Secondary | ICD-10-CM | POA: Diagnosis not present

## 2020-01-25 DIAGNOSIS — H53461 Homonymous bilateral field defects, right side: Secondary | ICD-10-CM | POA: Diagnosis not present

## 2020-01-25 DIAGNOSIS — H53462 Homonymous bilateral field defects, left side: Secondary | ICD-10-CM | POA: Diagnosis not present

## 2020-03-01 ENCOUNTER — Ambulatory Visit: Payer: PPO

## 2020-03-01 ENCOUNTER — Other Ambulatory Visit: Payer: Self-pay

## 2020-03-01 DIAGNOSIS — Z130 Encounter for screening for diseases of the blood and blood-forming organs and certain disorders involving the immune mechanism: Secondary | ICD-10-CM | POA: Diagnosis not present

## 2020-03-01 DIAGNOSIS — Z13220 Encounter for screening for lipoid disorders: Secondary | ICD-10-CM | POA: Diagnosis not present

## 2020-03-01 DIAGNOSIS — Z1329 Encounter for screening for other suspected endocrine disorder: Secondary | ICD-10-CM | POA: Diagnosis not present

## 2020-03-01 DIAGNOSIS — Z13228 Encounter for screening for other metabolic disorders: Secondary | ICD-10-CM | POA: Diagnosis not present

## 2020-03-01 LAB — LIPID PANEL
Chol/HDL Ratio: 3.5 ratio (ref 0.0–5.0)
Cholesterol, Total: 159 mg/dL (ref 100–199)
HDL: 45 mg/dL (ref >39)
LDL Chol Calc (NIH): 99 mg/dL (ref 0–99)
Triglycerides: 81 mg/dL (ref 0–149)
VLDL Cholesterol Cal: 15 mg/dL (ref 5–40)

## 2020-03-01 LAB — COMPREHENSIVE METABOLIC PANEL
ALT: 17 IU/L (ref 0–44)
AST: 24 IU/L (ref 0–40)
Albumin/Globulin Ratio: 1.9 (ref 1.2–2.2)
Albumin: 4.5 g/dL (ref 3.7–4.7)
Alkaline Phosphatase: 88 IU/L (ref 44–121)
BUN/Creatinine Ratio: 9 — ABNORMAL LOW (ref 10–24)
BUN: 10 mg/dL (ref 8–27)
Bilirubin Total: 0.6 mg/dL (ref 0.0–1.2)
CO2: 22 mmol/L (ref 20–29)
Calcium: 10 mg/dL (ref 8.6–10.2)
Chloride: 104 mmol/L (ref 96–106)
Creatinine, Ser: 1.07 mg/dL (ref 0.76–1.27)
GFR calc Af Amer: 80 mL/min/{1.73_m2} (ref >59)
GFR calc non Af Amer: 69 mL/min/{1.73_m2} (ref >59)
Globulin, Total: 2.4 g/dL (ref 1.5–4.5)
Glucose: 105 mg/dL — ABNORMAL HIGH (ref 65–99)
Potassium: 5 mmol/L (ref 3.5–5.2)
Sodium: 141 mmol/L (ref 134–144)
Total Protein: 6.9 g/dL (ref 6.0–8.5)

## 2020-03-01 NOTE — Addendum Note (Signed)
Addended by: Alfredia Ferguson A on: 03/01/2020 08:50 AM   Modules accepted: Orders

## 2020-03-30 DIAGNOSIS — L570 Actinic keratosis: Secondary | ICD-10-CM | POA: Diagnosis not present

## 2020-03-30 DIAGNOSIS — D2271 Melanocytic nevi of right lower limb, including hip: Secondary | ICD-10-CM | POA: Diagnosis not present

## 2020-03-30 DIAGNOSIS — D2261 Melanocytic nevi of right upper limb, including shoulder: Secondary | ICD-10-CM | POA: Diagnosis not present

## 2020-03-30 DIAGNOSIS — Z85828 Personal history of other malignant neoplasm of skin: Secondary | ICD-10-CM | POA: Diagnosis not present

## 2020-03-30 DIAGNOSIS — X32XXXA Exposure to sunlight, initial encounter: Secondary | ICD-10-CM | POA: Diagnosis not present

## 2020-03-30 DIAGNOSIS — D2262 Melanocytic nevi of left upper limb, including shoulder: Secondary | ICD-10-CM | POA: Diagnosis not present

## 2020-03-30 DIAGNOSIS — D2272 Melanocytic nevi of left lower limb, including hip: Secondary | ICD-10-CM | POA: Diagnosis not present

## 2020-03-31 ENCOUNTER — Other Ambulatory Visit: Payer: Self-pay | Admitting: Emergency Medicine

## 2020-03-31 DIAGNOSIS — K219 Gastro-esophageal reflux disease without esophagitis: Secondary | ICD-10-CM

## 2020-04-18 ENCOUNTER — Telehealth: Payer: Self-pay | Admitting: Emergency Medicine

## 2020-04-18 ENCOUNTER — Encounter: Payer: Self-pay | Admitting: Emergency Medicine

## 2020-04-18 NOTE — Telephone Encounter (Signed)
Wife is calling for the pt.  She reports he is having sinus issues.  When he blows his nose he has shooting pain through his head. She wants him to see the dr.  She said it is best to call her with appt particulars.  She wants him to be seen tomorrow by Dr Sharla Kidney.  She said when she went to touch his face, he said it hurt.  She is at the store and not with the pt.

## 2020-04-19 ENCOUNTER — Encounter: Payer: Self-pay | Admitting: Emergency Medicine

## 2020-04-19 ENCOUNTER — Other Ambulatory Visit: Payer: Self-pay

## 2020-04-19 ENCOUNTER — Telehealth (INDEPENDENT_AMBULATORY_CARE_PROVIDER_SITE_OTHER): Payer: PPO | Admitting: Emergency Medicine

## 2020-04-19 VITALS — Ht 71.0 in | Wt 196.0 lb

## 2020-04-19 DIAGNOSIS — J349 Unspecified disorder of nose and nasal sinuses: Secondary | ICD-10-CM

## 2020-04-19 DIAGNOSIS — G44019 Episodic cluster headache, not intractable: Secondary | ICD-10-CM | POA: Diagnosis not present

## 2020-04-19 MED ORDER — AMOXICILLIN-POT CLAVULANATE 875-125 MG PO TABS: 1.0000 | ORAL_TABLET | Freq: Two times a day (BID) | ORAL | 0 refills | Status: AC

## 2020-04-19 NOTE — Progress Notes (Addendum)
Telemedicine Encounter- SOAP NOTE Established Patient MyChart video conference Patient: Home  Provider: Office     This video encounter was conducted with the patient's (or proxy's) verbal consent via video telecommunications: yes/no: Yes Patient was instructed to have this encounter in a suitably private space; and to only have persons present to whom they give permission to participate. In addition, patient identity was confirmed by use of name plus two identifiers (DOB and address).  I discussed the limitations, risks, security and privacy concerns of performing an evaluation and management service by telephone and the availability of in person appointments. I also discussed with the patient that there may be a patient responsible charge related to this service. The patient expressed understanding and agreed to proceed.  I spent a total of TIME; 0 MIN TO 60 MIN: 20 minutes talking with the patient or their proxy.  Chief Complaint  Patient presents with   Sinusitis    Sharp pains near left eye and beside his nose. Going on since last wed  Pt also send his results from his CT scan from another provider in his mychart message from yesterday.    Subjective   Tyrone Curtis is a 70 y.o. male established patient. Telephone visit today complaining of 1 week history of pain to left side of the face, left eye with unilateral runny nose and watery eye.  Taking Tylenol for the pain.  States he has had episodes like this in the past and was given antibiotics with relief.  Thinks he may have a sinus infection.  Denies nausea or vomiting.  Able to eat and drink.  Denies visual disturbance, no blurry vision or loss of vision.  No fever or chills.  No other significant symptoms. Recent head and face CT scan results as follows: FINDINGS:  Encephalomalacia is present in the RIGHT posterior temporal and  parietal lobes. No acute intracranial abnormality identified. The  frontal sinuses are clear.  Ethmoid air cells are clear. Both  sphenoid sinuses are also clear. The LEFT maxillary sinus is  hypoplastic with mild inferior mucosal thickening. The RIGHT  maxillary sinus shows a tiny amount of mucosal thickening along the  floor and anterior wall. Ostiomeatal complexes appear adequately  patent. Leftward nasal septal deviation. Turbinate hypertrophy is  present.   Globes and orbits appear within normal limits. No evidence of  orbital or periorbital cellulitis. Visible cervical spine shows  spondylosis. Atlantodental degenerative disease. Carotid  atherosclerosis is present.   IMPRESSION:  Mild bilateral maxillary sinus disease. No periorbital or orbital  cellulitis.     HPI   Patient Active Problem List   Diagnosis Date Noted   Prediabetes 01/02/2018   Overweight (BMI 25.0-29.9) 01/02/2018   History of basal cell carcinoma 08/12/2017   Paroxysmal tachycardia ([place]) 08/23/2016   Smoking history 08/23/2016   Adjustment disorder with mixed anxiety and depressed mood 07/16/2016   Hyperlipidemia 01/06/2014   Gastroesophageal reflux disease without esophagitis 01/06/2014   Hemiparesis affecting left side as late effect of cerebrovascular accident (CVA) ([place]) 01/06/2014   Insomnia 07/26/2013   Erectile dysfunction 12/16/2011    Past Medical History:  Diagnosis Date   Allergic rhinitis, cause unspecified    Allergy    Anxiety state, unspecified    Arthritis    Basal cell carcinoma    facial   Cataract    Chicken pox    CVA (cerebral vascular accident) ([place])    Depression    Diaphragmatic hernia without mention of obstruction  or gangrene    Dysphagia, unspecified(787.20)    Erectile dysfunction    Herpes zoster without mention of complication    Hyperlipidemia    [employer]reesia 04/18/2020   Internal hemorrhoids without mention of complication    Measles    Mumps    x 2   Personal history of colonic polyps    Pure hypercholesterolemia     Regurgitation    Stroke ([place])    [employer]reesia 04/18/2020    Current Outpatient Medications  Medication Sig Dispense Refill   Acetaminophen (TYLENOL PO) Take by mouth as needed.     ASPIRIN 81 PO Take 81 mg by mouth 2 (two) times daily.     atorvastatin (LIPITOR) 80 MG tablet TAKE 1 TABLET BY MOUTH DAILY 90 tablet 3   loratadine (CLARITIN) 10 MG tablet Take 10 mg by mouth daily.     melatonin 5 MG TABS Take 5 mg by mouth.     Multiple Vitamin (MULTIVITAMINS PO) Take by mouth daily.     omeprazole (PRILOSEC) 40 MG capsule TAKE 1 CAPSULE(40 MG) BY MOUTH DAILY 90 capsule 1   No current facility-administered medications for this visit.    Allergies  Allergen Reactions   Zyrtec D [Cetirizine-Pseudoephedrine Er]     "talking out of his head"   Other     Social History   Socioeconomic History   Marital status: Married    Spouse name: [name]   Number of children: 1   Years of education: Not on file   Highest education level: Not on file  Occupational History   Occupation: Disability    Comment: CVA   Occupation: Previous Financial controller of [employer]  Tobacco Use   Smoking status: Former Smoker    Packs/day: 2.00    Years: 25.00    Pack years: 50.00    Types: Cigarettes   Smokeless tobacco: Never Used   Tobacco comment: quit in 1984  Vaping Use   Vaping Use: Never used  Substance and Sexual Activity   Alcohol use: Yes    Alcohol/week: 6.0 standard drinks    Types: 6 Cans of beer per week    Comment: 6 drinks/week   Drug use: No   Sexual activity: Yes  Other Topics Concern   Not on file  Social History Narrative   Marital status:  Married x 43 years      Children: 1 daughte (45)r; 1 grandson (17)      Lives: with wife; has mountain house and [place] house at [employer]      Employment: disabled since CVA      Tobacco: none      Alcohol:  1-2 drinks per night      Drugs:  None      Exercise: very active in the yard. No formal exercise program in 2019.       ADLs: independent with ADLs; drives locally; maintains own yard.        Advanced Directives:  NONE in 2019.  FULL CODE; no prolonged measures.         Social Determinants of Health   Financial Resource Strain: Not on file  Food Insecurity: Not on file  Transportation Needs: Not on file  Physical Activity: Not on file  Stress: Not on file  Social Connections: Not on file  Intimate Partner Violence: Not on file    Review of Systems  Constitutional: Negative.  Negative for chills and fever.  HENT: Positive for congestion. Negative for sore throat.  Respiratory: Negative.  Negative for cough and shortness of breath.   Cardiovascular: Negative.  Negative for chest pain and palpitations.  Gastrointestinal: Negative.  Negative for abdominal pain, diarrhea, nausea and vomiting.  Musculoskeletal: Negative for neck pain.  Skin: Negative.  Negative for rash.  Neurological: Positive for headaches. Negative for dizziness.  All other systems reviewed and are negative.   Objective  Alert and oriented x3 in no apparent respiratory distress Vitals as reported by the patient: Today's Vitals   04/19/20 0856  Weight: 196 lb (88.9 kg)  Height: 5\' 11"  (1.803 m)    There are no diagnoses linked to this encounter.  Tyrone Curtis was seen today for sinusitis.  Diagnoses and all orders for this visit:  Sinus disease -     amoxicillin-clavulanate (AUGMENTIN) 875-125 MG tablet; Take 1 tablet by mouth 2 (two) times daily for 7 days.  Episodic cluster headache, not intractable    Clinically stable.  No red flag signs or symptoms.  Possible sinus infection but cluster headache most likely.  Clinical picture most suggestive of cluster headaches. Advised to contact the office if no better or worse during the next several days.  May need office appointment next week.  I discussed the assessment and treatment plan with the patient. The patient was provided an opportunity to ask questions and all  were answered. The patient agreed with the plan and demonstrated an understanding of the instructions.   The patient was advised to call back or seek an in-person evaluation if the symptoms worsen or if the condition fails to improve as anticipated.  I provided 20 minutes of non-face-to-face time during this encounter.  Horald Pollen, MD  Primary Care at [facility]

## 2020-04-19 NOTE — Telephone Encounter (Signed)
Noted. Pt does have a virtual with provider today. His concerns will be addressed at that time.

## 2020-04-19 NOTE — Patient Instructions (Signed)
° ° ° °  If you have lab work done today you will be contacted with your lab results within the next 2 weeks.  If you have not heard from us then please contact us. The fastest way to get your results is to register for My Chart. ° ° °IF you received an x-ray today, you will receive an invoice from [facility]. Please contact [facility] at [phone] with questions or concerns regarding your invoice.  ° °IF you received labwork today, you will receive an invoice from [facility]. Please contact [facility] at [phone] with questions or concerns regarding your invoice.  ° °Our billing staff will not be able to assist you with questions regarding bills from these companies. ° °You will be contacted with the lab results as soon as they are available. The fastest way to get your results is to activate your My Chart account. Instructions are located on the last page of this paperwork. If you have not heard from us regarding the results in 2 weeks, please contact this office. °  ° ° ° °

## 2020-04-27 ENCOUNTER — Ambulatory Visit (INDEPENDENT_AMBULATORY_CARE_PROVIDER_SITE_OTHER): Payer: PPO | Admitting: Emergency Medicine

## 2020-04-27 ENCOUNTER — Other Ambulatory Visit: Payer: Self-pay

## 2020-04-27 ENCOUNTER — Encounter: Payer: Self-pay | Admitting: Emergency Medicine

## 2020-04-27 VITALS — BP 149/74 | HR 59 | Temp 97.9°F | Resp 16 | Ht 71.75 in | Wt 201.0 lb

## 2020-04-27 DIAGNOSIS — J349 Unspecified disorder of nose and nasal sinuses: Secondary | ICD-10-CM | POA: Diagnosis not present

## 2020-04-27 DIAGNOSIS — R519 Headache, unspecified: Secondary | ICD-10-CM

## 2020-04-27 DIAGNOSIS — G44019 Episodic cluster headache, not intractable: Secondary | ICD-10-CM | POA: Diagnosis not present

## 2020-04-27 MED ORDER — TRAMADOL HCL 50 MG PO TABS: 50.0000 mg | ORAL_TABLET | Freq: Three times a day (TID) | ORAL | 0 refills | Status: AC | PRN

## 2020-04-27 MED ORDER — PREDNISONE 20 MG PO TABS: 20.0000 mg | ORAL_TABLET | Freq: Every day | ORAL | 0 refills | Status: AC

## 2020-04-27 MED ORDER — AMOXICILLIN-POT CLAVULANATE 875-125 MG PO TABS: 1.0000 | ORAL_TABLET | Freq: Two times a day (BID) | ORAL | 0 refills | Status: AC

## 2020-04-27 NOTE — Patient Instructions (Addendum)
If you have lab work done today you will be contacted with your lab results within the next 2 weeks.  If you have not heard from [place] then please contact us. The fastest way to get your results is to register for My Chart.   IF you received an x-ray today, you will receive an invoice from [facility]. Please contact [facility] at [phone] with questions or concerns regarding your invoice.   IF you received labwork today, you will receive an invoice from [facility]. Please contact [facility] at [phone] with questions or concerns regarding your invoice.   Our billing staff will not be able to assist you with questions regarding bills from these companies.  You will be contacted with the lab results as soon as they are available. The fastest way to get your results is to activate your My Chart account. Instructions are located on the last page of this paperwork. If you have not heard from [place] regarding the results in 2 weeks, please contact this office.     Cluster Headache Cluster headaches hurt a lot. They normally happen on one side of your head, but they may switch sides. Often, cluster headaches:  Cause a lot of pain.  Happen for weeks to months.  Last from 15 minutes to 3 hours.  Happen at the same time each day.  Happen at night.  Happen many times a day.  Happen more often in the fall and springtime. What are the causes? The exact cause is not known. They are not usually caused by foods, changes in body chemicals (hormonal changes), or stress. What increases the risk?  Being a male between the ages of 71-22 years old.  Smoking or using products that contain nicotine or tobacco.  Having elevated levels of body chemical called histamine. This can happen in people who have allergies.  Taking certain medicines that cause blood vessels to expand.  Having a parent or brother or sister who has cluster headaches. What are the signs or  symptoms?  Very bad pain on one side of the head that begins behind or around your eye but may spread to your face, head, and neck.  Feeling like you may vomit (nauseous).  Being sensitive to light.  Runny nose and stuffy nose.  Swelling of the forehead or face on the affected side.  Eye problems. This might include a droopy or swollen eyelid, eye redness, or tearing on the affected side.  Feeling restless or upset.  Pale skin or a flushed face. How is this treated?  Medicines.  Oxygen that is breathed in through a mask. Follow these instructions at home: Headache diary Keep a headache diary as told by your doctor. Doing this can help you and your doctor figure out what triggers your headaches. In your headache diary, include information about:  The time of day that your headache started and what you were doing when it began.  How long your headache lasted.  Where your pain started and whether it moved to other areas.  The type of pain.  Your level of pain. Use a pain scale and rate the pain with a number from 1 (mild) up to 10 (very bad).  The treatment that you used, and any change in symptoms after treatment.   Medicines  Take over-the-counter and prescription medicines only as told by your health care provider.  Ask your doctor if the medicine prescribed to you: ? Requires you to avoid driving or using machinery. ?  Can cause trouble pooping (constipation). You may need to take these actions to prevent or treat trouble pooping:  Drink enough fluid to keep your pee (urine) pale yellow.  Take over-the-counter or prescription medicines.  Eat foods that are high in fiber. These include beans, whole grains, and fresh fruits and vegetables.  Limit foods that are high in fat and processed sugars. These include fried or sweet foods. Lifestyle  Go to bed at the same time each night. Get the same amount of sleep every night. Get 7-9 hours of sleep each night, or the  amount recommended by your doctor.  Limit or manage stress.  Exercise regularly. Exercise for at least 30 minutes, 5 times each week.  Eat a healthy diet. Avoid any foods that you know may trigger your headaches.  Do not drink alcohol.  Do not use any products that contain nicotine or tobacco, such as cigarettes, e-cigarettes, and chewing tobacco. If you need help quitting, ask your doctor.   General instructions  Use oxygen as told by your doctor.  Keep all follow-up visits as told by your health care provider. This is important. Contact a doctor if:  Your headaches: ? Change. ? Get worse. ? Happen more often.  Your medicines or oxygen are not helping. Get help right away if:  You faint.  You get weak or lose feeling (have numbness) on one side of your body or face.  You see two of everything (double vision).  You feel you may vomit or you vomit and it does stop after many hours.  You have trouble with your balance or with walking.  You have trouble talking.  You have neck pain or stiffness and you have a fever. Summary  Cluster headaches hurt a lot.  Keep a headache diary.  Do not drink alcohol.  Medicines and oxygen may help you feel better. This information is not intended to replace advice given to you by your health care provider. Make sure you discuss any questions you have with your health care provider. Document Revised: 10/29/2019 Document Reviewed: 04/01/2019 Elsevier Patient Education  2021 [employer].

## 2020-04-27 NOTE — Progress Notes (Signed)
Tyrone Curtis 70 y.o.   No chief complaint on file.   HISTORY OF PRESENT ILLNESS: This is a 70 y.o. male complaining of intermittent sharp pains to left face and head with teary eyes and runny nose for the past 1 to 2 weeks. Recent CT of head and face showed mild bilateral maxillary sinusitis for which he took some antibiotics. Still having some recurrent pains.  Description sounds like neuropathic pain. No other associated symptoms. No other complaints or medical concerns today.  HPI   Prior to Admission medications   Medication Sig Start Date End Date Taking? Authorizing Provider  Acetaminophen (TYLENOL PO) Take by mouth as needed.   Yes [provider]  amoxicillin-clavulanate (AUGMENTIN) 875-125 MG tablet Take 1 tablet by mouth 2 (two) times daily for 7 days. 04/27/20 05/04/20 Yes SagardiaInes Bloomer, MD  ASPIRIN 81 PO Take 81 mg by mouth 2 (two) times daily.   Yes [provider]  atorvastatin (LIPITOR) 80 MG tablet TAKE 1 TABLET BY MOUTH DAILY 08/27/19  Yes Amontae Ng, Ines Bloomer, MD  loratadine (CLARITIN) 10 MG tablet Take 10 mg by mouth daily.   Yes [provider]  melatonin 5 MG TABS Take 5 mg by mouth.   Yes [provider]  Multiple Vitamin (MULTIVITAMINS PO) Take by mouth daily.   Yes [provider]  predniSONE (DELTASONE) 20 MG tablet Take 1 tablet (20 mg total) by mouth daily with breakfast for 5 days. 04/27/20 05/02/20 Yes Almarosa Bohac, Ines Bloomer, MD  traMADol (ULTRAM) 50 MG tablet Take 1 tablet (50 mg total) by mouth every 8 (eight) hours as needed for up to 5 days. 04/27/20 05/02/20 Yes Annalei Friesz, Ines Bloomer, MD  omeprazole (PRILOSEC) 40 MG capsule TAKE 1 CAPSULE(40 MG) BY MOUTH DAILY Patient not taking: Reported on 04/27/2020 03/31/20   Horald Pollen, MD    Allergies  Allergen Reactions  . Zyrtec D [Cetirizine-Pseudoephedrine Er]     "talking out of his head"  . Other     Patient Active Problem List    Diagnosis Date Noted  . Prediabetes 01/02/2018  . Overweight (BMI 25.0-29.9) 01/02/2018  . History of basal cell carcinoma 08/12/2017  . Paroxysmal tachycardia ([place]) 08/23/2016  . Smoking history 08/23/2016  . Adjustment disorder with mixed anxiety and depressed mood 07/16/2016  . Hyperlipidemia 01/06/2014  . Gastroesophageal reflux disease without esophagitis 01/06/2014  . Hemiparesis affecting left side as late effect of cerebrovascular accident (CVA) ([place]) 01/06/2014  . Insomnia 07/26/2013  . Erectile dysfunction 12/16/2011    Past Medical History:  Diagnosis Date  . Allergic rhinitis, cause unspecified   . Allergy   . Anxiety state, unspecified   . Arthritis   . Basal cell carcinoma    facial  . Cataract   . Chicken pox   . CVA (cerebral vascular accident) ([place])   . Depression   . 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 Dysphagia, unspecified(787.20)   . Erectile dysfunction   . Herpes zoster without mention of complication   . Hyperlipidemia    [employer]reesia 04/18/2020  . Internal hemorrhoids without mention of complication   . Measles   . Mumps    x 2  . Personal history of colonic polyps   . Pure hypercholesterolemia   . Regurgitation   . Stroke [facility])    Phreesia 04/18/2020    Past Surgical History:  Procedure Laterality Date  . basal cell carcinoma of anterior chest  2000   resection;  [facility] Iles  . cataract surgery  2010   with lens repacement Left  . COLONOSCOPY  09/23/12   normal.  Symptoms: anemia, hemoccult+.  [facility].  . ESOPHAGOGASTRODUODENOSCOPY  09/23/12   normal.  Symptoms: anemia, hemoccult +, GERD.  [facility] EYE SURGERY Bilateral    cataract  . FRACTURE SURGERY Left    1957 left arm  . small bowel mass  1991   removed part of small intestine benign mass  . SMALL INTESTINE SURGERY      Social History   Socioeconomic History  . Marital status: Married    Spouse name: [name]  . Number of children: 1  . Years of  education: Not on file  . Highest education level: Not on file  Occupational History  . Occupation: Disability    Comment: CVA  . Occupation: Previous Financial controller of [employer]  Tobacco Use  . Smoking status: Former Smoker    Packs/day: 2.00    Years: 25.00    Pack years: 50.00    Types: Cigarettes  . Smokeless tobacco: Never Used  . Tobacco comment: quit in 1984  Vaping Use  . Vaping Use: Never used  Substance and Sexual Activity  . Alcohol use: Yes    Alcohol/week: 6.0 standard drinks    Types: 6 Cans of beer per week    Comment: 6 drinks/week  . Drug use: No  . Sexual activity: Yes  Other Topics Concern  . Not on file  Social History Narrative   Marital status:  Married x 43 years      Children: 1 daughte (45)r; 1 grandson (70)      Lives: with wife; has mountain house and [place] house at [employer]      Employment: disabled since CVA      Tobacco: none      Alcohol:  1-2 drinks per night      Drugs:  None      Exercise: very active in the yard. No formal exercise program in 2019.      ADLs: independent with ADLs; drives locally; maintains own yard.        Advanced Directives:  NONE in 2019.  FULL CODE; no prolonged measures.         Social Determinants of Health   Financial Resource Strain: Not on file  Food Insecurity: Not on file  Transportation Needs: Not on file  Physical Activity: Not on file  Stress: Not on file  Social Connections: Not on file  Intimate Partner Violence: Not on file    Family History  Problem Relation Age of Onset  . Hyperlipidemia Sister   . Mental illness Sister        anxiety  . Liver disease Sister   . Hyperlipidemia Brother   . Diabetes Brother   . Arthritis Brother        Knee replacement  . Bipolar disorder Brother   . Liver disease Brother   . Heart disease Brother 80       AMI/CAD with stenting  . Diabetes Mother   . Depression Mother   . Esophageal varices Mother   . Hyperlipidemia Mother   . Mental illness Mother   .  Liver disease Mother   . Cancer Father        unknown     Review of Systems  Constitutional: Negative.  Negative for chills and fever.  HENT: Positive for congestion and sinus pain. Negative for sore throat.   Respiratory: Negative.  Negative for  cough and shortness of breath.   Cardiovascular: Negative.  Negative for chest pain and palpitations.  Gastrointestinal: Negative.  Negative for abdominal pain, diarrhea, nausea and vomiting.  Genitourinary: Negative.  Negative for dysuria and hematuria.  Musculoskeletal: Negative.  Negative for back pain, myalgias and neck pain.  Skin: Negative.  Negative for rash.  Neurological: Negative.  Negative for dizziness and headaches.  All other systems reviewed and are negative.  Today's Vitals   04/27/20 1549  BP: (!) 149/74  Pulse: (!) 59  Resp: 16  Temp: 97.9 F (36.6 C)  TempSrc: Temporal  SpO2: 96%  Weight: 201 lb (91.2 kg)  Height: 5' 11.75" (1.822 m)   Body mass index is 27.45 kg/m.   Physical Exam Vitals reviewed.  Constitutional:      Appearance: Normal appearance.  HENT:     Head: Normocephalic.     Right Ear: Tympanic membrane, ear canal and external ear normal.     Left Ear: Tympanic membrane, ear canal and external ear normal.     Nose: Nose normal.     Mouth/Throat:     Mouth: Mucous membranes are moist.     Pharynx: Oropharynx is clear.  Eyes:     Extraocular Movements: Extraocular movements intact.     Conjunctiva/sclera: Conjunctivae normal.     Pupils: Pupils are equal, round, and reactive to light.  Cardiovascular:     Rate and Rhythm: Normal rate and regular rhythm.     Pulses: Normal pulses.     Heart sounds: Normal heart sounds.  Pulmonary:     Effort: Pulmonary effort is normal.     Breath sounds: Normal breath sounds.  Musculoskeletal:        General: Normal range of motion.     Cervical back: Normal range of motion and neck supple. No tenderness.  Lymphadenopathy:     Cervical: No cervical  adenopathy.  Skin:    General: Skin is warm and dry.     Capillary Refill: Capillary refill takes less than 2 seconds.  Neurological:     General: No focal deficit present.     Mental Status: He is alert and oriented to person, place, and time.     Cranial Nerves: No cranial nerve deficit.     Sensory: No sensory deficit.     Motor: No weakness.     Gait: Gait normal.  Psychiatric:        Mood and Affect: Mood normal.        Behavior: Behavior normal.      ASSESSMENT & PLAN: Raife was seen today for sinus problem.  Diagnoses and all orders for this visit:  Episodic cluster headache, not intractable -     predniSONE (DELTASONE) 20 MG tablet; Take 1 tablet (20 mg total) by mouth daily with breakfast for 5 days.  Sinus disease -     amoxicillin-clavulanate (AUGMENTIN) 875-125 MG tablet; Take 1 tablet by mouth 2 (two) times daily for 7 days.  Facial pain -     traMADol (ULTRAM) 50 MG tablet; Take 1 tablet (50 mg total) by mouth every 8 (eight) hours as needed for up to 5 days.    Patient Instructions       If you have lab work done today you will be contacted with your lab results within the next 2 weeks.  If you have not heard from [place] then please contact us. The fastest way to get your results is to register for My Chart.  IF you received an x-ray today, you will receive an invoice from [facility]. Please contact [facility] at [phone] with questions or concerns regarding your invoice.   IF you received labwork today, you will receive an invoice from [facility]. Please contact [facility] at [phone] with questions or concerns regarding your invoice.   Our billing staff will not be able to assist you with questions regarding bills from these companies.  You will be contacted with the lab results as soon as they are available. The fastest way to get your results is to activate your My Chart account. Instructions are located on the last page of  this paperwork. If you have not heard from [place] regarding the results in 2 weeks, please contact this office.     Cluster Headache Cluster headaches hurt a lot. They normally happen on one side of your head, but they may switch sides. Often, cluster headaches:  Cause a lot of pain.  Happen for weeks to months.  Last from 15 minutes to 3 hours.  Happen at the same time each day.  Happen at night.  Happen many times a day.  Happen more often in the fall and springtime. What are the causes? The exact cause is not known. They are not usually caused by foods, changes in body chemicals (hormonal changes), or stress. What increases the risk?  Being a male between the ages of 19-41 years old.  Smoking or using products that contain nicotine or tobacco.  Having elevated levels of body chemical called histamine. This can happen in people who have allergies.  Taking certain medicines that cause blood vessels to expand.  Having a parent or brother or sister who has cluster headaches. What are the signs or symptoms?  Very bad pain on one side of the head that begins behind or around your eye but may spread to your face, head, and neck.  Feeling like you may vomit (nauseous).  Being sensitive to light.  Runny nose and stuffy nose.  Swelling of the forehead or face on the affected side.  Eye problems. This might include a droopy or swollen eyelid, eye redness, or tearing on the affected side.  Feeling restless or upset.  Pale skin or a flushed face. How is this treated?  Medicines.  Oxygen that is breathed in through a mask. Follow these instructions at home: Headache diary Keep a headache diary as told by your doctor. Doing this can help you and your doctor figure out what triggers your headaches. In your headache diary, include information about:  The time of day that your headache started and what you were doing when it began.  How long your headache lasted.  Where  your pain started and whether it moved to other areas.  The type of pain.  Your level of pain. Use a pain scale and rate the pain with a number from 1 (mild) up to 10 (very bad).  The treatment that you used, and any change in symptoms after treatment.   Medicines  Take over-the-counter and prescription medicines only as told by your health care provider.  Ask your doctor if the medicine prescribed to you: ? Requires you to avoid driving or using machinery. ? Can cause trouble pooping (constipation). You may need to take these actions to prevent or treat trouble pooping:  Drink enough fluid to keep your pee (urine) pale yellow.  Take over-the-counter or prescription medicines.  Eat foods that are high in fiber. These include beans, whole grains,  and fresh fruits and vegetables.  Limit foods that are high in fat and processed sugars. These include fried or sweet foods. Lifestyle  Go to bed at the same time each night. Get the same amount of sleep every night. Get 7-9 hours of sleep each night, or the amount recommended by your doctor.  Limit or manage stress.  Exercise regularly. Exercise for at least 30 minutes, 5 times each week.  Eat a healthy diet. Avoid any foods that you know may trigger your headaches.  Do not drink alcohol.  Do not use any products that contain nicotine or tobacco, such as cigarettes, e-cigarettes, and chewing tobacco. If you need help quitting, ask your doctor.   General instructions  Use oxygen as told by your doctor.  Keep all follow-up visits as told by your health care provider. This is important. Contact a doctor if:  Your headaches: ? Change. ? Get worse. ? Happen more often.  Your medicines or oxygen are not helping. Get help right away if:  You faint.  You get weak or lose feeling (have numbness) on one side of your body or face.  You see two of everything (double vision).  You feel you may vomit or you vomit and it does stop  after many hours.  You have trouble with your balance or with walking.  You have trouble talking.  You have neck pain or stiffness and you have a fever. Summary  Cluster headaches hurt a lot.  Keep a headache diary.  Do not drink alcohol.  Medicines and oxygen may help you feel better. This information is not intended to replace advice given to you by your health care provider. Make sure you discuss any questions you have with your health care provider. Document Revised: 10/29/2019 Document Reviewed: 04/01/2019 Elsevier Patient Education  2021 Elsevier Inc.      Agustina Caroli, MD [facility]

## 2020-04-28 ENCOUNTER — Encounter: Payer: Self-pay | Admitting: Emergency Medicine

## 2020-05-09 ENCOUNTER — Other Ambulatory Visit: Payer: Self-pay | Admitting: Emergency Medicine

## 2020-05-09 ENCOUNTER — Encounter: Payer: Self-pay | Admitting: Emergency Medicine

## 2020-05-09 DIAGNOSIS — R519 Headache, unspecified: Secondary | ICD-10-CM

## 2020-05-09 MED ORDER — TRAMADOL HCL 50 MG PO TABS: 50.0000 mg | ORAL_TABLET | Freq: Three times a day (TID) | ORAL | 0 refills | Status: AC | PRN

## 2020-05-09 NOTE — Telephone Encounter (Signed)
OV 04/27/2020 for sinusitis  Finished abx 05/04/2020 Lt eye pain had remained but Sensitivity to touch on the Lt side of the nose began 05/07/2020   Pt asking what he should do for these continued issues

## 2020-05-09 NOTE — Telephone Encounter (Signed)
Pt asking your advise to help ease symptoms in the mean time reports taking : Tylenol and phenylephrine 10 mg  I also take a Claritin each morning with my daily meds.   Could you advise as to dosages or alternatives.  Please advise   Note from 04/27/2020 reported tramadol for facial pain but was a 5 day rx

## 2020-05-09 NOTE — Telephone Encounter (Signed)
I think his condition needs to continue running its course. At this point I would not recommend any new medication just monitoring of symptoms and office follow-up if no changes or no better in the next 1 to 2 weeks.

## 2020-05-09 NOTE — Telephone Encounter (Signed)
Another prescription for tramadol sent to pharmacy of record.  Thanks.

## 2020-08-15 ENCOUNTER — Other Ambulatory Visit: Payer: Self-pay

## 2020-08-16 ENCOUNTER — Encounter: Payer: Self-pay | Admitting: Emergency Medicine

## 2020-08-16 ENCOUNTER — Ambulatory Visit (INDEPENDENT_AMBULATORY_CARE_PROVIDER_SITE_OTHER): Payer: PPO | Admitting: Emergency Medicine

## 2020-08-16 VITALS — BP 116/62 | HR 60 | Temp 98.2°F | Ht 71.75 in | Wt 193.8 lb

## 2020-08-16 DIAGNOSIS — Z0000 Encounter for general adult medical examination without abnormal findings: Secondary | ICD-10-CM

## 2020-08-16 DIAGNOSIS — Z23 Encounter for immunization: Secondary | ICD-10-CM

## 2020-08-16 DIAGNOSIS — Z13228 Encounter for screening for other metabolic disorders: Secondary | ICD-10-CM

## 2020-08-16 DIAGNOSIS — Z1329 Encounter for screening for other suspected endocrine disorder: Secondary | ICD-10-CM

## 2020-08-16 DIAGNOSIS — Z8673 Personal history of transient ischemic attack (TIA), and cerebral infarction without residual deficits: Secondary | ICD-10-CM | POA: Diagnosis not present

## 2020-08-16 DIAGNOSIS — K219 Gastro-esophageal reflux disease without esophagitis: Secondary | ICD-10-CM | POA: Diagnosis not present

## 2020-08-16 DIAGNOSIS — Z125 Encounter for screening for malignant neoplasm of prostate: Secondary | ICD-10-CM

## 2020-08-16 DIAGNOSIS — Z130 Encounter for screening for diseases of the blood and blood-forming organs and certain disorders involving the immune mechanism: Secondary | ICD-10-CM

## 2020-08-16 DIAGNOSIS — R7303 Prediabetes: Secondary | ICD-10-CM | POA: Diagnosis not present

## 2020-08-16 DIAGNOSIS — E782 Mixed hyperlipidemia: Secondary | ICD-10-CM

## 2020-08-16 DIAGNOSIS — Z13220 Encounter for screening for lipoid disorders: Secondary | ICD-10-CM | POA: Diagnosis not present

## 2020-08-16 LAB — CBC WITH DIFFERENTIAL/PLATELET
Basophils Absolute: 0 10*3/uL (ref 0.0–0.1)
Basophils Relative: 0.6 % (ref 0.0–3.0)
Eosinophils Absolute: 0.1 10*3/uL (ref 0.0–0.7)
Eosinophils Relative: 2.2 % (ref 0.0–5.0)
HCT: 45.2 % (ref 39.0–52.0)
Hemoglobin: 15.1 g/dL (ref 13.0–17.0)
Lymphocytes Relative: 23.6 % (ref 12.0–46.0)
Lymphs Abs: 1.4 10*3/uL (ref 0.7–4.0)
MCHC: 33.4 g/dL (ref 30.0–36.0)
MCV: 94 fl (ref 78.0–100.0)
Monocytes Absolute: 0.6 10*3/uL (ref 0.1–1.0)
Monocytes Relative: 9.6 % (ref 3.0–12.0)
Neutro Abs: 3.9 10*3/uL (ref 1.4–7.7)
Neutrophils Relative %: 64 % (ref 43.0–77.0)
Platelets: 206 10*3/uL (ref 150.0–400.0)
RBC: 4.8 Mil/uL (ref 4.22–5.81)
RDW: 13 % (ref 11.5–15.5)
WBC: 6 10*3/uL (ref 4.0–10.5)

## 2020-08-16 LAB — COMPREHENSIVE METABOLIC PANEL
ALT: 17 U/L (ref 0–53)
AST: 22 U/L (ref 0–37)
Albumin: 4.5 g/dL (ref 3.5–5.2)
Alkaline Phosphatase: 63 U/L (ref 39–117)
BUN: 13 mg/dL (ref 6–23)
CO2: 28 mEq/L (ref 19–32)
Calcium: 9.6 mg/dL (ref 8.4–10.5)
Chloride: 104 mEq/L (ref 96–112)
Creatinine, Ser: 1.01 mg/dL (ref 0.40–1.50)
GFR: 74.55 mL/min (ref >60.00)
Glucose, Bld: 96 mg/dL (ref 70–99)
Potassium: 4.4 mEq/L (ref 3.5–5.1)
Sodium: 140 mEq/L (ref 135–145)
Total Bilirubin: 1.1 mg/dL (ref 0.2–1.2)
Total Protein: 7.3 g/dL (ref 6.0–8.3)

## 2020-08-16 LAB — PSA: PSA: 0.72 ng/mL (ref 0.10–4.00)

## 2020-08-16 LAB — HEMOGLOBIN A1C: Hgb A1c MFr Bld: 5.7 % (ref 4.6–6.5)

## 2020-08-16 LAB — TSH: TSH: 3.58 u[IU]/mL (ref 0.35–4.50)

## 2020-08-16 LAB — LIPID PANEL
Cholesterol: 145 mg/dL (ref 0–200)
HDL: 54.5 mg/dL (ref >39.00)
LDL Cholesterol: 77 mg/dL (ref 0–99)
NonHDL: 90.99
Total CHOL/HDL Ratio: 3
Triglycerides: 68 mg/dL (ref 0.0–149.0)
VLDL: 13.6 mg/dL (ref 0.0–40.0)

## 2020-08-16 NOTE — Addendum Note (Signed)
Addended by: Alfredia Ferguson A on: 08/16/2020 11:04 AM   Modules accepted: Orders

## 2020-08-16 NOTE — Progress Notes (Signed)
Tyrone Curtis 70 y.o.   Chief Complaint  Patient presents with  . Follow-up    Return in one year    HISTORY OF PRESENT ILLNESS: This is a 70 y.o. male here for annual exam. Doing well.  Has no complaints or medical concerns today. 1.  Status post CVA 2012.  Sees neurologist, Dr. Brigitte Pulse, regularly. Takes 1 baby aspirin daily. 2.  Hyperlipidemia: On Lipitor 80 mg daily. 3.  History of GERD, on Prilosec 40 mg daily.  Symptoms very well controlled. 4.  History of depression/anxiety but doing much better: Off medication. Fully vaccinated against Covid. Has history of chronic EtOH use but stopped completely 1 year ago.  Family history of alcoholism. Stays very physically active and eats well.   HPI   Prior to Admission medications   Medication Sig Start Date End Date Taking? Authorizing Provider  Acetaminophen (TYLENOL PO) Take by mouth as needed.    [provider]  ASPIRIN 81 PO Take 81 mg by mouth 2 (two) times daily.    [provider]  atorvastatin (LIPITOR) 80 MG tablet TAKE 1 TABLET BY MOUTH DAILY 08/27/19   Horald Pollen, MD  loratadine (CLARITIN) 10 MG tablet Take 10 mg by mouth daily.    [provider]  melatonin 5 MG TABS Take 5 mg by mouth.    [provider]  Multiple Vitamin (MULTIVITAMINS PO) Take by mouth daily.    [provider]  omeprazole (PRILOSEC) 40 MG capsule TAKE 1 CAPSULE(40 MG) BY MOUTH DAILY Patient not taking: Reported on 04/27/2020 03/31/20   Horald Pollen, MD    Allergies  Allergen Reactions  . Zyrtec D [Cetirizine-Pseudoephedrine Er]     "talking out of his head"  . Other     Patient Active Problem List   Diagnosis Date Noted  . Prediabetes 01/02/2018  . Overweight (BMI 25.0-29.9) 01/02/2018  . History of basal cell carcinoma 08/12/2017  . Paroxysmal tachycardia ([place]) 08/23/2016  . Smoking history 08/23/2016  . Adjustment disorder with mixed anxiety and depressed mood  07/16/2016  . Hyperlipidemia 01/06/2014  . Gastroesophageal reflux disease without esophagitis 01/06/2014  . Hemiparesis affecting left side as late effect of cerebrovascular accident (CVA) ([place]) 01/06/2014  . Insomnia 07/26/2013  . Erectile dysfunction 12/16/2011    Past Medical History:  Diagnosis Date  . Allergic rhinitis, cause unspecified   . Allergy   . Anxiety state, unspecified   . Arthritis   . Basal cell carcinoma    facial  . Cataract   . Chicken pox   . CVA (cerebral vascular accident) ([place])   . Depression   . 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 Dysphagia, unspecified(787.20)   . Erectile dysfunction   . Herpes zoster without mention of complication   . Hyperlipidemia    Phreesia 04/18/2020  . Internal hemorrhoids without mention of complication   . Measles   . Mumps    x 2  . Personal history of colonic polyps   . Pure hypercholesterolemia   . Regurgitation   . Stroke [facility])    Phreesia 04/18/2020    Past Surgical History:  Procedure Laterality Date  . basal cell carcinoma of anterior chest  2000   resection; [facility]  . cataract surgery  2010   with lens repacement Left  . COLONOSCOPY  09/23/12   normal.  Symptoms: anemia, hemoccult+.  [facility].  . ESOPHAGOGASTRODUODENOSCOPY  09/23/12   normal.  Symptoms: anemia, hemoccult +,  GERD.  [facility] EYE SURGERY Bilateral    cataract  . FRACTURE SURGERY Left    1957 left arm  . small bowel mass  1991   removed part of small intestine benign mass  . SMALL INTESTINE SURGERY      Social History   Socioeconomic History  . Marital status: Married    Spouse name: Tyrone Curtis  . Number of children: 1  . Years of education: Not on file  . Highest education level: Not on file  Occupational History  . Occupation: Disability    Comment: CVA  . Occupation: Previous Financial controller of [employer]  Tobacco Use  . Smoking status: Former Smoker    Packs/day: 2.00    Years: 25.00    Pack years:  50.00    Types: Cigarettes  . Smokeless tobacco: Never Used  . Tobacco comment: quit in 1984  Vaping Use  . Vaping Use: Never used  Substance and Sexual Activity  . Alcohol use: Yes    Alcohol/week: 6.0 standard drinks    Types: 6 Cans of beer per week    Comment: 6 drinks/week  . Drug use: No  . Sexual activity: Yes  Other Topics Concern  . Not on file  Social History Narrative   Marital status:  Married x 43 years      Children: 1 daughte (45)r; 1 grandson (24)      Lives: with wife; has mountain house and [place] house at [employer]      Employment: disabled since CVA      Tobacco: none      Alcohol:  1-2 drinks per night      Drugs:  None      Exercise: very active in the yard. No formal exercise program in 2019.      ADLs: independent with ADLs; drives locally; maintains own yard.        Advanced Directives:  NONE in 2019.  FULL CODE; no prolonged measures.         Social Determinants of Health   Financial Resource Strain: Not on file  Food Insecurity: Not on file  Transportation Needs: Not on file  Physical Activity: Not on file  Stress: Not on file  Social Connections: Not on file  Intimate Partner Violence: Not on file    Family History  Problem Relation Age of Onset  . Hyperlipidemia Sister   . Mental illness Sister        anxiety  . Liver disease Sister   . Hyperlipidemia Brother   . Diabetes Brother   . Arthritis Brother        Knee replacement  . Bipolar disorder Brother   . Liver disease Brother   . Heart disease Brother 76       AMI/CAD with stenting  . Diabetes Mother   . Depression Mother   . Esophageal varices Mother   . Hyperlipidemia Mother   . Mental illness Mother   . Liver disease Mother   . Cancer Father        unknown     Review of Systems  Constitutional: Negative.  Negative for chills and fever.  HENT: Negative.  Negative for congestion and sore throat.   Respiratory: Negative.  Negative for cough and shortness of breath.    Cardiovascular: Negative.  Negative for chest pain and palpitations.  Gastrointestinal: Negative for abdominal pain, nausea and vomiting.  Genitourinary: Negative.  Negative for dysuria and hematuria.  Musculoskeletal: Negative.  Negative for back pain, myalgias  and neck pain.  Skin: Negative.  Negative for rash.  Neurological: Negative.  Negative for dizziness and headaches.  All other systems reviewed and are negative.  Today's Vitals   08/16/20 0753  BP: 116/62  Pulse: 60  Temp: 98.2 F (36.8 C)  TempSrc: Oral  SpO2: 96%  Weight: 193 lb 12.8 oz (87.9 kg)  Height: 5' 11.75" (1.822 m)   Body mass index is 26.47 kg/m. Wt Readings from Last 3 Encounters:  08/16/20 193 lb 12.8 oz (87.9 kg)  04/27/20 201 lb (91.2 kg)  04/19/20 196 lb (88.9 kg)     Physical Exam Vitals reviewed.  Constitutional:      Appearance: Normal appearance.  HENT:     Head: Normocephalic.  Eyes:     Extraocular Movements: Extraocular movements intact.     Pupils: Pupils are equal, round, and reactive to light.  Cardiovascular:     Rate and Rhythm: Normal rate and regular rhythm.     Pulses: Normal pulses.     Heart sounds: Normal heart sounds.  Abdominal:     General: Bowel sounds are normal. There is no distension.     Palpations: Abdomen is soft.     Tenderness: There is no abdominal tenderness.  Musculoskeletal:        General: Normal range of motion.     Cervical back: Normal range of motion and neck supple.  Skin:    General: Skin is warm and dry.     Capillary Refill: Capillary refill takes less than 2 seconds.  Neurological:     General: No focal deficit present.     Mental Status: He is alert and oriented to person, place, and time.  Psychiatric:        Mood and Affect: Mood normal.        Behavior: Behavior normal.      ASSESSMENT & PLAN:  Tyrone Curtis was seen today for follow-up.  Diagnoses and all orders for this visit:  Routine general medical examination at a health  care facility  Screening for deficiency anemia -     CBC with Differential  Screening for lipoid disorders -     Lipid panel  Screening for endocrine, metabolic and immunity disorder -     Comprehensive metabolic panel -     Hemoglobin A1c -     PSA(Must document that pt has been informed of limitations of PSA testing.) -     TSH  Need for vaccination -     Cancel: Pneumococcal conjugate vaccine 20-valent (Prevnar 20)  Gastroesophageal reflux disease without esophagitis  Mixed hyperlipidemia  Prediabetes  History of stroke     Modifiable cardiovascular risk factors discussed with patient. Anticipatory guidance according to age provided. The following topics were discussed: Smoking Diet and nutrition Benefits of exercise Cancer screening and review of most recent colonoscopy Vaccinations Cardiovascular risk assessment Mental health including depression and anxiety Fall and accident prevention Secondary stroke prevention   Patient Instructions     Health Maintenance, Male Adopting a healthy lifestyle and getting preventive care are important in promoting health and wellness. Ask your health care provider about:  The right schedule for you to have regular tests and exams.  Things you can do on your own to prevent diseases and keep yourself healthy. What should I know about diet, weight, and exercise? Eat a healthy diet  Eat a diet that includes plenty of vegetables, fruits, low-fat dairy products, and lean protein.  Do not eat a lot of  foods that are high in solid fats, added sugars, or sodium.   Maintain a healthy weight Body mass index (BMI) is a measurement that can be used to identify possible weight problems. It estimates body fat based on height and weight. Your health care provider can help determine your BMI and help you achieve or maintain a healthy weight. Get regular exercise Get regular exercise. This is one of the most important things you can do  for your health. Most adults should:  Exercise for at least 150 minutes each week. The exercise should increase your heart rate and make you sweat (moderate-intensity exercise).  Do strengthening exercises at least twice a week. This is in addition to the moderate-intensity exercise.  Spend less time sitting. Even light physical activity can be beneficial. Watch cholesterol and blood lipids Have your blood tested for lipids and cholesterol at 72 years of age, then have this test every 5 years. You may need to have your cholesterol levels checked more often if:  Your lipid or cholesterol levels are high.  You are older than 72 years of age.  You are at high risk for heart disease. What should I know about cancer screening? Many types of cancers can be detected early and may often be prevented. Depending on your health history and family history, you may need to have cancer screening at various ages. This may include screening for:  Colorectal cancer.  Prostate cancer.  Skin cancer.  Lung cancer. What should I know about heart disease, diabetes, and high blood pressure? Blood pressure and heart disease  High blood pressure causes heart disease and increases the risk of stroke. This is more likely to develop in people who have high blood pressure readings, are of African descent, or are overweight.  Talk with your health care provider about your target blood pressure readings.  Have your blood pressure checked: ? Every 3-5 years if you are 70-29 years of age. ? Every year if you are 2 years old or older.  If you are between the ages of 53 and 19 and are a current or former smoker, ask your health care provider if you should have a one-time screening for abdominal aortic aneurysm (AAA). Diabetes Have regular diabetes screenings. This checks your fasting blood sugar level. Have the screening done:  Once every three years after age 62 if you are at a normal weight and have a low  risk for diabetes.  More often and at a younger age if you are overweight or have a high risk for diabetes. What should I know about preventing infection? Hepatitis B If you have a higher risk for hepatitis B, you should be screened for this virus. Talk with your health care provider to find out if you are at risk for hepatitis B infection. Hepatitis C Blood testing is recommended for:  Everyone born from [birth year] through 1967.  Anyone with known risk factors for hepatitis C. Sexually transmitted infections (STIs)  You should be screened each year for STIs, including gonorrhea and chlamydia, if: ? You are sexually active and are younger than 72 years of age. ? You are older than 72 years of age and your health care provider tells you that you are at risk for this type of infection. ? Your sexual activity has changed since you were last screened, and you are at increased risk for chlamydia or gonorrhea. Ask your health care provider if you are at risk.  Ask your health care provider about whether  you are at high risk for HIV. Your health care provider may recommend a prescription medicine to help prevent HIV infection. If you choose to take medicine to prevent HIV, you should first get tested for HIV. You should then be tested every 3 months for as long as you are taking the medicine. Follow these instructions at home: Lifestyle  Do not use any products that contain nicotine or tobacco, such as cigarettes, e-cigarettes, and chewing tobacco. If you need help quitting, ask your health care provider.  Do not use street drugs.  Do not share needles.  Ask your health care provider for help if you need support or information about quitting drugs. Alcohol use  Do not drink alcohol if your health care provider tells you not to drink.  If you drink alcohol: ? Limit how much you have to 0-2 drinks a day. ? Be aware of how much alcohol is in your drink. In the [place] one drink equals one 12 oz  bottle of beer (355 mL), one 5 oz glass of wine (148 mL), or one 1 oz glass of hard liquor (44 mL). General instructions  Schedule regular health, dental, and eye exams.  Stay current with your vaccines.  Tell your health care provider if: ? You often feel depressed. ? You have ever been abused or do not feel safe at home. Summary  Adopting a healthy lifestyle and getting preventive care are important in promoting health and wellness.  Follow your health care provider's instructions about healthy diet, exercising, and getting tested or screened for diseases.  Follow your health care provider's instructions on monitoring your cholesterol and blood pressure. This information is not intended to replace advice given to you by your health care provider. Make sure you discuss any questions you have with your health care provider. Document Revised: 02/18/2018 Document Reviewed: 02/18/2018 Elsevier Patient Education  2021 [place], MD [facility]

## 2020-08-16 NOTE — Patient Instructions (Signed)

## 2020-08-17 ENCOUNTER — Encounter: Payer: Self-pay | Admitting: Emergency Medicine

## 2020-09-02 ENCOUNTER — Encounter: Payer: Self-pay | Admitting: Emergency Medicine

## 2020-09-23 ENCOUNTER — Other Ambulatory Visit: Payer: Self-pay | Admitting: Emergency Medicine

## 2020-09-23 DIAGNOSIS — K219 Gastro-esophageal reflux disease without esophagitis: Secondary | ICD-10-CM

## 2020-09-23 DIAGNOSIS — E782 Mixed hyperlipidemia: Secondary | ICD-10-CM

## 2020-10-04 ENCOUNTER — Encounter: Payer: Self-pay | Admitting: Emergency Medicine

## 2020-10-04 ENCOUNTER — Telehealth: Payer: Self-pay

## 2020-10-04 NOTE — Telephone Encounter (Signed)
Called and spoke with pt and wife, pt states that he will hold off on the virtual visit and just request a call from the office in a few days to check on pt.

## 2020-10-04 NOTE — Telephone Encounter (Signed)
pts wife has stated pt tested POS for COVID this morning and is wanting to know what he should do next.  **Pts sxs are nasal/chest congestion and body aches, pts last read temp was 98.16f Pt is requesting the anti-viral Paxlovid.

## 2020-10-05 ENCOUNTER — Encounter: Payer: Self-pay | Admitting: Family Medicine

## 2020-10-05 ENCOUNTER — Telehealth (INDEPENDENT_AMBULATORY_CARE_PROVIDER_SITE_OTHER): Payer: PPO | Admitting: Family Medicine

## 2020-10-05 ENCOUNTER — Other Ambulatory Visit: Payer: Self-pay | Admitting: Internal Medicine

## 2020-10-05 VITALS — Temp 99.6°F

## 2020-10-05 DIAGNOSIS — U071 COVID-19: Secondary | ICD-10-CM | POA: Diagnosis not present

## 2020-10-05 MED ORDER — NIRMATRELVIR/RITONAVIR (PAXLOVID)TABLET: 3.0000 | ORAL_TABLET | Freq: Two times a day (BID) | ORAL | 0 refills | Status: AC

## 2020-10-05 NOTE — Patient Instructions (Signed)
HOME CARE TIPS:  -[facility] testing information: https://www.rivera-powers.org/ OR [phone] Most pharmacies also offer testing and home test kits. If the Covid19 test is positive, please make a prompt follow up visit with your primary care office or with [facility] to discuss treatment options. Treatments for Covid19 are best given early in the course of the illness.   -I sent the medication(s) we discussed to your pharmacy: Meds ordered this encounter  Medications   nirmatrelvir/ritonavir EUA (PAXLOVID) TABS    Sig: Take 3 tablets by mouth 2 (two) times daily for 5 days. (Take nirmatrelvir 150 mg two tablets twice daily for 5 days and ritonavir 100 mg one tablet twice daily for 5 days) Patient GFR is 74    Dispense:  30 tablet    Refill:  0     -I sent in the [facility] treatment or referral you requested per our discussion. Please see the information provided below and discuss further with the pharmacist/treatment team.  Please stop taking (hold) your Lipitor(atorvastatin) while taking Paxlovid and restart 3 days after finishing the Paxlovid.    -can use nasal saline a few times per day if you have nasal congestion  -stay hydrated, drink plenty of fluids and eat small healthy meals - avoid dairy   -follow up with your doctor in 2-3 days unless improving and feeling better  -stay home while sick, except to seek medical care. If you have COVID19, ideally it would be best to stay home for a full 10 days since the onset of symptoms PLUS one day of no fever and feeling better. Wear a good mask that fits snugly (such as N95 or KN95) if around others to reduce the risk of transmission.  It was nice to meet you today, and I really hope you are feeling better soon. I help [facility] out with telemedicine visits on Tuesdays and Thursdays and am available for visits on those days. If you have any concerns or questions following this visit please schedule a  follow up visit with your Primary Care doctor or seek care at a local urgent care clinic to avoid delays in care.    Seek in person care or schedule a follow up video visit promptly if your symptoms worsen, new concerns arise or you are not improving with treatment. Call 911 and/or seek emergency care if your symptoms are severe or life threatening.  FACT SHEET FOR PATIENTS, PARENTS, AND CAREGIVERS EMERGENCY USE AUTHORIZATION (EUA) OF PAXLOVID FOR CORONAVIRUS DISEASE 2019 (COVID-19) You are being given this Fact Sheet because your healthcare provider believes it is necessary to provide you with PAXLOVID for the treatment of mild-to-moderate coronavirus disease (COVID-19) caused by the SARS-CoV-2 virus. This Fact Sheet contains information to help you understand the risks and benefits of taking the PAXLOVID you have received or may receive. The U.S. Food and Drug Administration (FDA) has issued an Emergency Use Authorization (EUA) to make PAXLOVID available during the COVID-19 pandemic (for more details about an EUA please see "What is an Emergency Use Authorization?" at the end of this document). PAXLOVID is not an FDA-approved medicine in the [place]. Read this Fact Sheet for information about PAXLOVID. Talk to your healthcare provider about your options or if you have any questions. It is your choice to take PAXLOVID.  What is COVID-19? COVID-19 is caused by a virus called a coronavirus. You can get COVID-19 through close contact with another person who has the virus. COVID-19 illnesses have ranged from very mild-to-severe, including illness  resulting in death. While information so far suggests that most COVID-19 illness is mild, serious illness can happen and may cause some of your other medical conditions to become worse. Older people and people of all ages with severe, long lasting (chronic) medical conditions like heart disease, lung disease, and diabetes, for example seem to  be at higher risk of being hospitalized for COVID-19.  What is PAXLOVID? PAXLOVID is an investigational medicine used to treat mild-to-moderate COVID-19 in adults and children [69 years of age and older weighing at least 20 pounds (21 kg)] with positive results of direct SARS-CoV-2 viral testing, and who are at high risk for progression to severe COVID-19, including hospitalization or death. PAXLOVID is investigational because it is still being studied. There is limited information about the safety and effectiveness of using PAXLOVID to treat people with mild-to-moderate COVID-19.  The FDA has authorized the emergency use of PAXLOVID for the treatment of mild-tomoderate COVID-19 in adults and children [40 years of age and older weighing at least 66 pounds (35 kg)] with a positive test for the virus that causes COVID-19, and who are at high risk for progression to severe COVID-19, including hospitalization or death, under an EUA. 1 Revised: 26 May 2020   What should I tell my healthcare provider before I take PAXLOVID? Tell your healthcare provider if you: ? Have any allergies ? Have liver or kidney disease ? Are pregnant or plan to become pregnant ? Are breastfeeding a child ? Have any serious illnesses  Tell your healthcare provider about all the medicines you take, including prescription and over-the-counter medicines, vitamins, and herbal supplements. Some medicines may interact with PAXLOVID and may cause serious side effects. Keep a list of your medicines to show your healthcare provider and pharmacist when you get a new medicine.  You can ask your healthcare provider or pharmacist for a list of medicines that interact with PAXLOVID. Do not start taking a new medicine without telling your healthcare provider. Your healthcare provider can tell you if it is safe to take PAXLOVID with other medicines.  Tell your healthcare provider if you are taking combined hormonal  contraceptive. PAXLOVID may affect how your birth control pills work. Females who are able to become pregnant should use another effective alternative form of contraception or an additional barrier method of contraception. Talk to your healthcare provider if you have any questions about contraceptive methods that might be right for you.  How do I take PAXLOVID? ? PAXLOVID consists of 2 medicines: nirmatrelvir and ritonavir. o Take 2 pink tablets of nirmatrelvir with 1 white tablet of ritonavir by mouth 2 times each day (in the morning and in the evening) for 5 days. For each dose, take all 3 tablets at the same time. o If you have kidney disease, talk to your healthcare provider. You may need a different dose. ? Swallow the tablets whole. Do not chew, break, or crush the tablets. ? Take PAXLOVID with or without food. ? Do not stop taking PAXLOVID without talking to your healthcare provider, even if you feel better. ? If you miss a dose of PAXLOVID within 8 hours of the time it is usually taken, take it as soon as you remember. If you miss a dose by more than 8 hours, skip the missed dose and take the next dose at your regular time. Do not take 2 doses of PAXLOVID at the same time. ? If you take too much PAXLOVID, call your healthcare provider or go  to the nearest hospital emergency room right away. ? If you are taking a ritonavir- or cobicistat-containing medicine to treat hepatitis C or Human Immunodeficiency Virus (HIV), you should continue to take your medicine as prescribed by your healthcare provider. 2 Revised: 26 May 2020    Talk to your healthcare provider if you do not feel better or if you feel worse after 5 days.  Who should generally not take PAXLOVID? Do not take PAXLOVID if: ? You are allergic to nirmatrelvir, ritonavir, or any of the ingredients in PAXLOVID. ? You are taking any of the following medicines: o Alfuzosin o Pethidine, propoxyphene o Ranolazine o  Amiodarone, dronedarone, flecainide, propafenone, quinidine o Colchicine o Lurasidone, pimozide, clozapine o Dihydroergotamine, ergotamine, methylergonovine o Lovastatin, simvastatin o Sildenafil (Revatio) for pulmonary arterial hypertension (PAH) o Triazolam, oral midazolam o Apalutamide o Carbamazepine, phenobarbital, phenytoin o Rifampin o St. John's Wort (hypericum perforatum) Taking PAXLOVID with these medicines may cause serious or life-threatening side effects or affect how PAXLOVID works.  These are not the only medicines that may cause serious side effects if taken with PAXLOVID. PAXLOVID may increase or decrease the levels of multiple other medicines. It is very important to tell your healthcare provider about all of the medicines you are taking because additional laboratory tests or changes in the dose of your other medicines may be necessary while you are taking PAXLOVID. Your healthcare provider may also tell you about specific symptoms to watch out for that may indicate that you need to stop or decrease the dose of some of your other medicines.  What are the important possible side effects of PAXLOVID? Possible side effects of PAXLOVID are: ? Allergic Reactions. Allergic reactions can happen in people taking PAXLOVID, even after only 1 dose. Stop taking PAXLOVID and call your healthcare provider right away if you get any of the following symptoms of an allergic reaction: o hives o trouble swallowing or breathing o swelling of the mouth, lips, or face o throat tightness o hoarseness 3 Revised: 26 May 2020  o skin rash ? Liver Problems. Tell your healthcare provider right away if you have any of these signs and symptoms of liver problems: loss of appetite, yellowing of your skin and the whites of eyes (jaundice), dark-colored urine, pale colored stools and itchy skin, stomach area (abdominal) pain. ? Resistance to HIV Medicines. If you have untreated HIV  infection, PAXLOVID may lead to some HIV medicines not working as well in the future. ? Other possible side effects include: o altered sense of taste o diarrhea o high blood pressure o muscle aches These are not all the possible side effects of PAXLOVID. Not many people have taken PAXLOVID. Serious and unexpected side effects may happen. PAXLOVID is still being studied, so it is possible that all of the risks are not known at this time.  What other treatment choices are there? Veklury (remdesivir) is FDA-approved for the treatment of mild-to-moderate HERDE-08 in certain adults and children. Talk with your doctor to see if Marijean Heath is appropriate for you. Like PAXLOVID, FDA may also allow for the emergency use of other medicines to treat people with COVID-19. Go to https://price.info/ for information on the emergency use of other medicines that are authorized by FDA to treat people with COVID-19. Your healthcare provider may talk with you about clinical trials for which you may be eligible. It is your choice to be treated or not to be treated with PAXLOVID. Should you decide not to receive it or  for your child not to receive it, it will not change your standard medical care.  What if I am pregnant or breastfeeding? There is no experience treating pregnant women or breastfeeding mothers with PAXLOVID. For a mother and unborn baby, the benefit of taking PAXLOVID may be greater than the risk from the treatment. If you are pregnant, discuss your options and specific situation with your healthcare provider. It is recommended that you use effective barrier contraception or do not have sexual activity while taking PAXLOVID. If you are breastfeeding, discuss your options and specific situation with your healthcare provider. 4 Revised: 26 May 2020   How do I report side effects with  PAXLOVID? Contact your healthcare provider if you have any side effects that bother you or do not go away. Report side effects to FDA MedWatch at SmoothHits.hu or call 1-800-FDA1088 or you can report side effects to [employer]. at the contact information provided below. Website Fax number Telephone number www.pfizersafetyreporting.com [phone] How should I store [place]? Store PAXLOVID tablets at room temperature, between 68?F to 77?F (20?C to 25?C). How can I learn more about COVID-19? ? Ask your healthcare provider. ? Visit https://jacobson-johnson.com/. ? Contact your local or state public health department. What is an Emergency Use Authorization (EUA)? The [place] FDA has made PAXLOVID available under an emergency access mechanism called an Emergency Use Authorization (EUA). The EUA is supported by a Education officer, museum and Human Service (HHS) declaration that circumstances exist to justify the emergency use of drugs and biological products during the COVID-19 pandemic. PAXLOVID for the treatment of mild-to-moderate COVID-19 in adults and children [80 years of age and older weighing at least 25 pounds (64 kg)] with positive results of direct SARS-CoV-2 viral testing, and who are at high risk for progression to severe COVID-19, including hospitalization or death, has not undergone the same type of review as an FDA-approved product. In issuing an EUA under the OTLXB-26 public health emergency, the FDA has determined, among other things, that based on the total amount of scientific evidence available including data from adequate and well-controlled clinical trials, if available, it is reasonable to believe that the product may be effective for diagnosing, treating, or preventing COVID-19, or a serious or life-threatening disease or condition caused by COVID-19; that the known and potential benefits of the product, when used to diagnose, treat, or prevent  such disease or condition, outweigh the known and potential risks of such product; and that there are no adequate, approved, and available alternatives. All of these criteria must be met to allow for the product to be used in the treatment of patients during the COVID-19 pandemic. The EUA for PAXLOVID is in effect for the duration of the COVID-19 declaration justifying emergency use of this product, unless terminated or revoked (after which the products may no longer be used under the EUA). 5 Revised: 26 May 2020     Additional Information For general questions, visit the website or call the telephone number provided below. Website Telephone number www.COVID19oralRx.com [phone] (1-877-C19-PACK) You can also go to www.pfizermedinfo.com or call [phone] for more information. [id number] Revised: 26 May 2020

## 2020-10-05 NOTE — Progress Notes (Signed)
Virtual Visit via Video Note  I connected with Tyrone Curtis  on 10/05/20 at  5:20 PM EDT by a video enabled telemedicine application and verified that I am speaking with the correct person using two identifiers.  Location patient: home, [place] Location provider:work or home office Persons participating in the virtual visit: patient, provider, patient's wife  I discussed the limitations of evaluation and management by telemedicine and the availability of in person appointments. The patient expressed understanding and agreed to proceed.   HPI:  Acute telemedicine visit for Covid19: -Onset: 2 days ago -Symptoms include: nasal, cough, body aches, fever -Denies:CP, SOB, NVD, inability to eat/drink/get out of bed -Pertinent past medical history: see below -Pertinent medication allergies:  Allergies  Allergen Reactions   Zyrtec D [Cetirizine-Pseudoephedrine Er]     "talking out of his head"   Other   -COVID-19 vaccine status: vaccinated x2 and boosted x2 -GFR 74 on recent labs  ROS: See pertinent positives and negatives per HPI.  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  Symptoms: anemia, hemoccult+.  [facility].   ESOPHAGOGASTRODUODENOSCOPY  09/23/12   normal.  Symptoms: anemia, hemoccult +, GERD.   EYE SURGERY Bilateral    cataract   FRACTURE SURGERY Left    1957 left arm   small bowel mass  1991   removed part of small intestine benign mass   SMALL INTESTINE SURGERY       Current Outpatient Medications:    Acetaminophen (TYLENOL PO), Take by mouth as needed., Disp: , Rfl:    ASPIRIN 81 PO, Take 162 mg by mouth daily., Disp: , Rfl:    atorvastatin (LIPITOR) 80 MG tablet, TAKE 1 TABLET BY MOUTH DAILY, Disp: 90 tablet, Rfl: 3   loratadine (CLARITIN) 10 MG tablet, Take 10 mg by mouth daily., Disp: , Rfl:    Multiple Vitamin (MULTIVITAMINS PO), Take by mouth daily., Disp: , Rfl:    nirmatrelvir/ritonavir EUA (PAXLOVID) TABS, Take 3 tablets by mouth 2 (two) times daily for 5 days. (Take nirmatrelvir 150 mg two tablets twice daily for 5 days and ritonavir 100 mg one tablet twice daily for 5 days) Patient GFR is 74, Disp: 30 tablet, Rfl: 0   omeprazole (PRILOSEC) 40 MG capsule, TAKE 1 CAPSULE(40 MG) BY MOUTH DAILY, Disp: 90 capsule, Rfl: 1  EXAM:  VITALS per patient if applicable:  GENERAL: alert, oriented, appears well and in no acute distress  HEENT: atraumatic, conjunttiva clear, no obvious  abnormalities on inspection of external nose and ears  NECK: normal movements of the head and neck  LUNGS: on inspection no signs of respiratory distress, breathing rate appears normal, no obvious gross SOB, gasping or wheezing  CV: no obvious cyanosis  MS: moves all visible extremities without noticeable abnormality  PSYCH/NEURO: [place] and cooperative, no obvious depression or anxiety, speech and thought processing grossly intact  ASSESSMENT AND PLAN:  Discussed the following assessment and plan:  COVID-19   Discussed treatment options, ideal treatment window, potential complications, isolation and precautions for COVID-19.   After lengthy discussion, the patient opted for treatment with Paxlovid due to being higher risk for complications of covid or severe disease and other factors. He agrees to hold his statin. Discussed EUA status of this drug and the fact that there is preliminary limited knowledge of risks/interactions/side effects per EUA document vs possible benefits and precautions. This information was shared with patient during the visit and also was provided in patient instructions. Also, advised that patient discuss risks/interactions and use with pharmacist/treatment team as well. Other symptomatic care measures summarized in patient instructions. Advised to seek prompt in person care if worsening, new symptoms arise, or if is not improving with treatment. Discussed options for inperson care if PCP office not available. Did let this patient know that I only do telemedicine on Tuesdays and Thursdays for [place]. Advised to schedule follow up visit with PCP or [facility] if any further questions or concerns to avoid delays in care.   I discussed the assessment and treatment plan with the patient. The patient was provided an opportunity to ask questions and all were answered. The patient agreed with the plan and demonstrated an understanding of the instructions.     Lucretia Kern, DO

## 2020-10-06 NOTE — Telephone Encounter (Signed)
Pt has a VV on 7/28 with Dr. Maudie Mercury

## 2020-10-10 ENCOUNTER — Encounter: Payer: Self-pay | Admitting: Emergency Medicine

## 2020-10-24 ENCOUNTER — Encounter: Payer: Self-pay | Admitting: Emergency Medicine

## 2020-11-28 DIAGNOSIS — C44629 Squamous cell carcinoma of skin of left upper limb, including shoulder: Secondary | ICD-10-CM | POA: Diagnosis not present

## 2020-11-28 DIAGNOSIS — D2261 Melanocytic nevi of right upper limb, including shoulder: Secondary | ICD-10-CM | POA: Diagnosis not present

## 2020-11-28 DIAGNOSIS — L570 Actinic keratosis: Secondary | ICD-10-CM | POA: Diagnosis not present

## 2020-11-28 DIAGNOSIS — D2271 Melanocytic nevi of right lower limb, including hip: Secondary | ICD-10-CM | POA: Diagnosis not present

## 2020-11-28 DIAGNOSIS — X32XXXA Exposure to sunlight, initial encounter: Secondary | ICD-10-CM | POA: Diagnosis not present

## 2020-11-28 DIAGNOSIS — Z85828 Personal history of other malignant neoplasm of skin: Secondary | ICD-10-CM | POA: Diagnosis not present

## 2020-11-28 DIAGNOSIS — D485 Neoplasm of uncertain behavior of skin: Secondary | ICD-10-CM | POA: Diagnosis not present

## 2020-11-28 DIAGNOSIS — D2262 Melanocytic nevi of left upper limb, including shoulder: Secondary | ICD-10-CM | POA: Diagnosis not present

## 2020-11-28 DIAGNOSIS — D0461 Carcinoma in situ of skin of right upper limb, including shoulder: Secondary | ICD-10-CM | POA: Diagnosis not present

## 2020-12-04 ENCOUNTER — Ambulatory Visit (INDEPENDENT_AMBULATORY_CARE_PROVIDER_SITE_OTHER): Payer: PPO

## 2020-12-04 VITALS — Ht 72.0 in | Wt 189.2 lb

## 2020-12-04 DIAGNOSIS — Z0000 Encounter for general adult medical examination without abnormal findings: Secondary | ICD-10-CM | POA: Diagnosis not present

## 2020-12-04 NOTE — Progress Notes (Signed)
I connected with Tyrone Curtis today by telephone and verified that I am speaking with the correct person using two identifiers. Location patient: home Location provider: work Persons participating in the virtual visit: patient, provider.   I discussed the limitations, risks, security and privacy concerns of performing an evaluation and management service by telephone and the availability of in person appointments. I also discussed with the patient that there may be a patient responsible charge related to this service. The patient expressed understanding and verbally consented to this telephonic visit.    Interactive audio and video telecommunications were attempted between this provider and patient, however failed, due to patient having technical difficulties OR patient did not have access to video capability.  We continued and completed visit with audio only.  Some vital signs may be absent or patient reported.   Time Spent with patient on telephone encounter: 40 minutes  Subjective:   Tyrone Curtis is a 70 y.o. male who presents for Medicare Annual/Subsequent preventive examination.  Review of Systems     Cardiac Risk Factors include: advanced age (>32men, >71 women);dyslipidemia     Objective:    Today's Vitals   12/04/20 1158  Weight: 189 lb 3.2 oz (85.8 kg)  Height: 6' (1.829 m)   Body mass index is 25.66 kg/m.  Advanced Directives 12/04/2020 08/11/2019 05/17/2019 12/17/2018 08/29/2015 08/01/2014  Does Patient Have a Medical Advance Directive? Yes Yes Yes Yes Yes No  Type of Advance Directive Living will;Healthcare Power of [place];Living will [place];Living will Living will;Healthcare Power of Attorney -  Does patient want to make changes to medical advance directive? No - Patient declined No - Patient declined - - - -  Copy of [place] in Chart? No - copy requested - No - copy requested No - copy  requested No - copy requested -  Would patient like information on creating a medical advance directive? - - No - Patient declined - - No - patient declined information    Current Medications (verified) Outpatient Encounter Medications as of 12/04/2020  Medication Sig   Acetaminophen (TYLENOL PO) Take by mouth as needed.   ASPIRIN 81 PO Take 162 mg by mouth daily.   atorvastatin (LIPITOR) 80 MG tablet TAKE 1 TABLET BY MOUTH DAILY   loratadine (CLARITIN) 10 MG tablet Take 10 mg by mouth daily.   Multiple Vitamin (MULTIVITAMINS PO) Take by mouth daily.   omeprazole (PRILOSEC) 40 MG capsule TAKE 1 CAPSULE(40 MG) BY MOUTH DAILY   No facility-administered encounter medications on file as of 12/04/2020.    Allergies (verified) Zyrtec d [cetirizine-pseudoephedrine er] and Other   History: Past Medical History:  Diagnosis Date   Allergic rhinitis, cause unspecified    Allergy    Anxiety state, unspecified    Arthritis    Basal cell carcinoma    facial   Cataract    Chicken pox    CVA (cerebral vascular accident) ([place])    Depression    Diaphragmatic hernia without mention of obstruction or gangrene    Dysphagia, unspecified(787.20)    Erectile dysfunction    Herpes zoster without mention of complication    Hyperlipidemia    Phreesia 04/18/2020   Internal hemorrhoids without mention of complication    Measles    Mumps    x 2   Personal history of colonic polyps    Pure hypercholesterolemia    Regurgitation    Stroke ([place])  Phreesia 04/18/2020   Past Surgical History:  Procedure Laterality Date   basal cell carcinoma of anterior chest  2000   resection; [place]   cataract surgery  2010   with lens repacement Left   COLONOSCOPY  09/23/12   normal.  Symptoms: anemia, hemoccult+.  [facility].   ESOPHAGOGASTRODUODENOSCOPY  09/23/12   normal.  Symptoms: anemia, hemoccult +, GERD.   EYE SURGERY Bilateral    cataract   FRACTURE SURGERY Left    1957 left arm   small  bowel mass  1991   removed part of small intestine benign mass   SMALL INTESTINE SURGERY     Family History  Problem Relation Age of Onset   Hyperlipidemia Sister    Mental illness Sister        anxiety   Liver disease Sister    Hyperlipidemia Brother    Diabetes Brother    Arthritis Brother        Knee replacement   Bipolar disorder Brother    Liver disease Brother    Heart disease Brother 16       AMI/CAD with stenting   Diabetes Mother    Depression Mother    Esophageal varices Mother    Hyperlipidemia Mother    Mental illness Mother    Liver disease Mother    Cancer Father        unknown   Social History   Socioeconomic History   Marital status: Married    Spouse name: [name]   Number of children: 1   Years of education: Not on file   Highest education level: Not on file  Occupational History   Occupation: Disability    Comment: CVA   Occupation: Previous Financial controller of [employer]  Tobacco Use   Smoking status: Former    Packs/day: 2.00    Years: 25.00    Pack years: 50.00    Types: Cigarettes   Smokeless tobacco: Never   Tobacco comments:    quit in 1984  Vaping Use   Vaping Use: Never used  Substance and Sexual Activity   Alcohol use: Yes    Alcohol/week: 6.0 standard drinks    Types: 6 Cans of beer per week    Comment: 6 drinks/week   Drug use: No   Sexual activity: Yes  Other Topics Concern   Not on file  Social History Narrative   Marital status:  Married x 43 years      Children: 1 daughte (45)r; 1 grandson (17)      Lives: with wife; has mountain house and [place] house at [employer]      Employment: disabled since CVA      Tobacco: none      Alcohol:  1-2 drinks per night      Drugs:  None      Exercise: very active in the yard. No formal exercise program in 2019.      ADLs: independent with ADLs; drives locally; maintains own yard.        Advanced Directives:  NONE in 2019.  FULL CODE; no prolonged measures.         Social Determinants of  Health   Financial Resource Strain: Low Risk    Difficulty of Paying Living Expenses: Not hard at all  Food Insecurity: No Food Insecurity   Worried About Charity fundraiser in the Last Year: Never true   Ran Out of Food in the Last Year: Never true  Transportation Needs: No Transportation  Needs   Lack of Transportation (Medical): No   Lack of Transportation (Non-Medical): No  Physical Activity: Sufficiently Active   Days of Exercise per Week: 5 days   Minutes of Exercise per Session: 30 min  Stress: No Stress Concern Present   Feeling of Stress : Not at all  Social Connections: Socially Integrated   Frequency of Communication with Friends and Family: More than three times a week   Frequency of Social Gatherings with Friends and Family: More than three times a week   Attends Religious Services: 1 to 4 times per year   Active Member of [employer] or Organizations: Yes   Attends Archivist Meetings: 1 to 4 times per year   Marital Status: Married    Tobacco Counseling Counseling given: Not Answered Tobacco comments: quit in 1984   Clinical Intake:  Pre-visit preparation completed: Yes  Pain : No/denies pain     BMI - recorded: 25.66 Nutritional Status: BMI 25 -29 Overweight Nutritional Risks: None Diabetes: No  How often do you need to have someone help you when you read instructions, pamphlets, or other written materials from your doctor or pharmacy?: 1 - Never What is the last grade level you completed in school?: Bachelor's Degree in Business Administration, Associates Degree in Computer Science  Diabetic? no  Interpreter Needed?: No  Information entered by :: Lisette Abu, LPN   Activities of Daily Living In your present state of health, do you have any difficulty performing the following activities: 12/04/2020  Hearing? Y  Comment tinnitus  Vision? N  Difficulty concentrating or making decisions? N  Walking or climbing stairs? N  Dressing or  bathing? N  Doing errands, shopping? N  Preparing Food and eating ? N  Using the Toilet? N  In the past six months, have you accidently leaked urine? N  Do you have problems with loss of bowel control? N  Managing your Medications? N  Managing your Finances? N  Housekeeping or managing your Housekeeping? N  Some recent data might be hidden    Patient Care Team: Horald Pollen, MD as PCP - General (Internal Medicine) Minna Merritts, MD as Consulting Physician (Cardiology) Dasher, Rayvon Char, MD (Dermatology) Vladimir Crofts, MD as Consulting Physician (Neurology)  Indicate any recent Medical Services you may have received from other than [facility] providers in the past year (date may be approximate).     Assessment:   This is a routine wellness examination for [place].  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 No hearing aids.  Vision Screening - Comments:: Patient had lens implants after cataracts removed.  Eye exam done annually by: [facility].  Dietary issues and exercise activities discussed: Current Exercise Habits: Home exercise routine, Type of exercise: walking, Time (Minutes): 30, Frequency (Times/Week): 5, Weekly Exercise (Minutes/Week): 150, Intensity: Moderate, Exercise limited by: None identified   Goals Addressed             This Visit's Progress    Client understands the importance of follow-up with providers by attending scheduled visits       My goal is to lose 6 pounds to get back to my ideal weight of 182 pounds.      Depression Screen PHQ 2/9 Scores 12/04/2020 08/16/2020 04/27/2020 04/19/2020 08/11/2019 03/03/2019 12/17/2018  PHQ - 2 Score 0 0 0 0 0 0 0  PHQ- 9 Score - - - - - - -    Fall  Risk Fall Risk  12/04/2020 08/16/2020 04/27/2020 04/19/2020 08/11/2019  Falls in the past year? 0 1 0 0 0  Comment - - - - -  Number falls in past yr: 0 0 - 0 -  Injury with Fall? 0 0 - 0 -  Comment - - - - -   Risk for fall due to : No Fall Risks No Fall Risks - - -  Follow up Falls evaluation completed Falls evaluation completed Falls evaluation completed Falls evaluation completed Falls evaluation completed    [place]:  Any stairs in or around the home? Yes  If so, are there any without handrails? No  Home free of loose throw rugs in walkways, pet beds, electrical cords, etc? Yes  Adequate lighting in your home to reduce risk of falls? Yes   ASSISTIVE DEVICES UTILIZED TO PREVENT FALLS:  Life alert? No  Use of a cane, walker or w/c? No  Grab bars in the bathroom? Yes  Shower chair or bench in shower? Yes  Elevated toilet seat or a handicapped toilet? Yes   TIMED UP AND GO:  Was the test performed? No .  Length of time to ambulate 10 feet: n/a sec.   Gait steady and fast without use of assistive device  Cognitive Function: Normal cognitive status assessed by direct observation by this Nurse Health Advisor. No abnormalities found.       6CIT Screen 08/11/2019 12/17/2018  What Year? 0 points 0 points  What month? 0 points 0 points  What time? 0 points 0 points  Count back from 20 0 points 0 points  Months in reverse 0 points 0 points  Repeat phrase 0 points 0 points  Total Score 0 0    Immunizations Immunization History  Administered Date(s) Administered   Fluad Quad(high Dose 65+) 12/08/2018   Influenza Split 01/02/2007, 02/06/2009, 04/03/2011, 12/16/2011   Influenza, High Dose Seasonal PF 01/22/2018, 12/11/2019, 12/13/2019   Influenza,inj,Quad PF,6+ Mos 01/03/2014, 01/09/2015, 01/16/2016, 01/20/2017   Influenza-Unspecified 12/04/2012   PFIZER Comirnaty(Gray Top)Covid-19 Tri-Sucrose Vaccine 04/20/2019, 05/11/2019, 12/24/2019, 06/16/2020   Pneumococcal Conjugate-13 08/01/2014   Pneumococcal Polysaccharide-23 07/16/2016   Tdap 04/30/2011   Zoster Recombinat (Shingrix) 08/16/2020   Zoster, Live 12/29/2014    TDAP status: Up to  date  Flu Vaccine status: Due, Education has been provided regarding the importance of this vaccine. Advised may receive this vaccine at local pharmacy or Health Dept. Aware to provide a copy of the vaccination record if obtained from local pharmacy or Health Dept. Verbalized acceptance and understanding.  Pneumococcal vaccine status: Up to date  Covid-19 vaccine status: Completed vaccines  Qualifies for Shingles Vaccine? Yes   Zostavax completed Yes   Shingrix Completed?: No.    Education has been provided regarding the importance of this vaccine. Patient has been advised to call insurance company to determine out of pocket expense if they have not yet received this vaccine. Advised may also receive vaccine at local pharmacy or Health Dept. Verbalized acceptance and understanding.  Screening Tests Health Maintenance  Topic Date Due   INFLUENZA VACCINE  10/09/2020   Zoster Vaccines- Shingrix (2 of 2) 10/11/2020   COVID-19 Vaccine (5 - Booster for Pfizer series) 10/16/2020   TETANUS/TDAP  04/29/2021   COLONOSCOPY (Pts 45-53yrs Insurance coverage will need to be confirmed)  09/25/2022   Hepatitis C Screening  Completed   HPV VACCINES  Aged Out    Health Maintenance  Health Maintenance Due  Topic Date  Due   INFLUENZA VACCINE  10/09/2020   Zoster Vaccines- Shingrix (2 of 2) 10/11/2020   COVID-19 Vaccine (5 - Booster for Pfizer series) 10/16/2020    Colorectal cancer screening: Type of screening: Colonoscopy. Completed 09/24/2012. Repeat every 10 years  Lung Cancer Screening: (Low Dose CT Chest recommended if Age 65-80 years, 30 pack-year currently smoking OR have quit w/in 15years.) does not qualify.   Lung Cancer Screening Referral: no  Additional Screening:  Hepatitis C Screening: does qualify; Completed: yes  Vision Screening: Recommended annual ophthalmology exams for early detection of glaucoma and other disorders of the eye. Is the patient up to date with their annual eye  exam?  Yes  Who is the provider or what is the name of the office in which the patient attends annual eye exams? [facility] If pt is not established with a provider, would they like to be referred to a provider to establish care? No .   Dental Screening: Recommended annual dental exams for proper oral hygiene  Community Resource Referral / Chronic Care Management: CRR required this visit?  No   CCM required this visit?  No      Plan:     I have personally reviewed and noted the following in the patient's chart:   Medical and social history Use of alcohol, tobacco or illicit drugs  Current medications and supplements including opioid prescriptions. Patient is not currently taking opioid prescriptions. Functional ability and status Nutritional status Physical activity Advanced directives List of other physicians Hospitalizations, surgeries, and ER visits in previous 12 months Vitals Screenings to include cognitive, depression, and falls Referrals and appointments  In addition, I have reviewed and discussed with patient certain preventive protocols, quality metrics, and best practice recommendations. A written personalized care plan for preventive services as well as general preventive health recommendations were provided to patient.     Sheral Flow, LPN   4/40/[id number]   Nurse Notes:  Patient provided weight and height. Patient is cogitatively intact. There were no vitals filed for this visit. Hearing Screening - Comments:: Patient has tinnitus due to history of CVA. No hearing aids.  Vision Screening - Comments:: Patient had lens implants after cataracts removed.  Eye exam done annually by: [facility].

## 2020-12-04 NOTE — Patient Instructions (Signed)
Tyrone Curtis , Thank you for taking time to come for your Medicare Wellness Visit. I appreciate your ongoing commitment to your health goals. Please review the following plan we discussed and let me know if I can assist you in the future.   Screening recommendations/referrals: Colonoscopy: 09/24/2012;due every 10 years Recommended yearly ophthalmology/optometry visit for glaucoma screening and checkup Recommended yearly dental visit for hygiene and checkup  Vaccinations: Influenza vaccine: due Fall 2022 Pneumococcal vaccine: 08/01/2014, 07/16/2016 Tdap vaccine: 04/30/2011; due every 10 years Shingles vaccine: never done; can check with local pharmacy    Covid-19: 04/20/2019, 05/11/2019, 12/24/2019, 06/16/2020  Advanced directives: Please bring a copy of your health care power of attorney and living will to the office at your convenience.  Conditions/risks identified: Yes; Client understands the importance of follow-up with providers by attending scheduled visits and discussed goals to eat healthier, increase physical activity, exercise the brain, socialize more, get enough sleep and make time for laughter.  Next appointment: Please schedule your next Medicare Wellness Visit with your Nurse Health Advisor in 1 year by calling [phone].  Preventive Care 72 Years and Older, Male Preventive care refers to lifestyle choices and visits with your health care provider that can promote health and wellness. What does preventive care include? A yearly physical exam. This is also called an annual well check. Dental exams once or twice a year. Routine eye exams. Ask your health care provider how often you should have your eyes checked. Personal lifestyle choices, including: Daily care of your teeth and gums. Regular physical activity. Eating a healthy diet. Avoiding tobacco and drug use. Limiting alcohol use. Practicing safe sex. Taking low doses of aspirin every day. Taking vitamin and mineral  supplements as recommended by your health care provider. What happens during an annual well check? The services and screenings done by your health care provider during your annual well check will depend on your age, overall health, lifestyle risk factors, and family history of disease. Counseling  Your health care provider may ask you questions about your: Alcohol use. Tobacco use. Drug use. Emotional well-being. Home and relationship well-being. Sexual activity. Eating habits. History of falls. Memory and ability to understand (cognition). Work and work Statistician. Screening  You may have the following tests or measurements: Height, weight, and BMI. Blood pressure. Lipid and cholesterol levels. These may be checked every 5 years, or more frequently if you are over 72 years old. Skin check. Lung cancer screening. You may have this screening every year starting at age 72 if you have a 30-pack-year history of smoking and currently smoke or have quit within the past 15 years. Fecal occult blood test (FOBT) of the stool. You may have this test every year starting at age 72. Flexible sigmoidoscopy or colonoscopy. You may have a sigmoidoscopy every 5 years or a colonoscopy every 10 years starting at age 72. Prostate cancer screening. Recommendations will vary depending on your family history and other risks. Hepatitis C blood test. Hepatitis B blood test. Sexually transmitted disease (STD) testing. Diabetes screening. This is done by checking your blood sugar (glucose) after you have not eaten for a while (fasting). You may have this done every 1-3 years. Abdominal aortic aneurysm (AAA) screening. You may need this if you are a current or former smoker. Osteoporosis. You may be screened starting at age 72 if you are at high risk. Talk with your health care provider about your test results, treatment options, and if necessary, the need for more tests.  Vaccines  Your health care provider  may recommend certain vaccines, such as: Influenza vaccine. This is recommended every year. Tetanus, diphtheria, and acellular pertussis (Tdap, Td) vaccine. You may need a Td booster every 10 years. Zoster vaccine. You may need this after age 72. Pneumococcal 13-valent conjugate (PCV13) vaccine. One dose is recommended after age 72 Pneumococcal polysaccharide (PPSV23) vaccine. One dose is recommended after age 72. Talk to your health care provider about which screenings and vaccines you need and how often you need them. This information is not intended to replace advice given to you by your health care provider. Make sure you discuss any questions you have with your health care provider. Document Released: 03/24/2015 Document Revised: 11/15/2015 Document Reviewed: 12/27/2014 Elsevier Interactive Patient Education  2017 [employer] Prevention in the Home Falls can cause injuries. They can happen to people of all ages. There are many things you can do to make your home safe and to help prevent falls. What can I do on the outside of my home? Regularly fix the edges of walkways and driveways and fix any cracks. Remove anything that might make you trip as you walk through a door, such as a raised step or threshold. Trim any bushes or trees on the path to your home. Use bright outdoor lighting. Clear any walking paths of anything that might make someone trip, such as rocks or tools. Regularly check to see if handrails are loose or broken. Make sure that both sides of any steps have handrails. Any raised decks and porches should have guardrails on the edges. Have any leaves, snow, or ice cleared regularly. Use sand or salt on walking paths during winter. Clean up any spills in your garage right away. This includes oil or grease spills. What can I do in the bathroom? Use night lights. Install grab bars by the toilet and in the tub and shower. Do not use towel bars as grab bars. Use  non-skid mats or decals in the tub or shower. If you need to sit down in the shower, use a plastic, non-slip stool. Keep the floor dry. Clean up any water that spills on the floor as soon as it happens. Remove soap buildup in the tub or shower regularly. Attach bath mats securely with double-sided non-slip rug tape. Do not have throw rugs and other things on the floor that can make you trip. What can I do in the bedroom? Use night lights. Make sure that you have a light by your bed that is easy to reach. Do not use any sheets or blankets that are too big for your bed. They should not hang down onto the floor. Have a firm chair that has side arms. You can use this for support while you get dressed. Do not have throw rugs and other things on the floor that can make you trip. What can I do in the kitchen? Clean up any spills right away. Avoid walking on wet floors. Keep items that you use a lot in easy-to-reach places. If you need to reach something above you, use a strong step stool that has a grab bar. Keep electrical cords out of the way. Do not use floor polish or wax that makes floors slippery. If you must use wax, use non-skid floor wax. Do not have throw rugs and other things on the floor that can make you trip. What can I do with my stairs? Do not leave any items on the stairs. Make sure that  there are handrails on both sides of the stairs and use them. Fix handrails that are broken or loose. Make sure that handrails are as long as the stairways. Check any carpeting to make sure that it is firmly attached to the stairs. Fix any carpet that is loose or worn. Avoid having throw rugs at the top or bottom of the stairs. If you do have throw rugs, attach them to the floor with carpet tape. Make sure that you have a light switch at the top of the stairs and the bottom of the stairs. If you do not have them, ask someone to add them for you. What else can I do to help prevent falls? Wear  shoes that: Do not have high heels. Have rubber bottoms. Are comfortable and fit you well. Are closed at the toe. Do not wear sandals. If you use a stepladder: Make sure that it is fully opened. Do not climb a closed stepladder. Make sure that both sides of the stepladder are locked into place. Ask someone to hold it for you, if possible. Clearly mark and make sure that you can see: Any grab bars or handrails. First and last steps. Where the edge of each step is. Use tools that help you move around (mobility aids) if they are needed. These include: Canes. Walkers. Scooters. Crutches. Turn on the lights when you go into a dark area. Replace any light bulbs as soon as they burn out. Set up your furniture so you have a clear path. Avoid moving your furniture around. If any of your floors are uneven, fix them. If there are any pets around you, be aware of where they are. Review your medicines with your doctor. Some medicines can make you feel dizzy. This can increase your chance of falling. Ask your doctor what other things that you can do to help prevent falls. This information is not intended to replace advice given to you by your health care provider. Make sure you discuss any questions you have with your health care provider. Document Released: 12/22/2008 Document Revised: 08/03/2015 Document Reviewed: 04/01/2014 Elsevier Interactive Patient Education  2017 [employer].

## 2020-12-12 DIAGNOSIS — C44629 Squamous cell carcinoma of skin of left upper limb, including shoulder: Secondary | ICD-10-CM | POA: Diagnosis not present

## 2020-12-15 ENCOUNTER — Encounter: Payer: Self-pay | Admitting: Emergency Medicine

## 2020-12-21 NOTE — Telephone Encounter (Signed)
Updated vaccines in patient chart.

## 2020-12-25 ENCOUNTER — Encounter: Payer: Self-pay | Admitting: Emergency Medicine

## 2020-12-27 DIAGNOSIS — D0461 Carcinoma in situ of skin of right upper limb, including shoulder: Secondary | ICD-10-CM | POA: Diagnosis not present

## 2021-01-22 ENCOUNTER — Encounter: Payer: Self-pay | Admitting: Emergency Medicine

## 2021-01-22 ENCOUNTER — Other Ambulatory Visit: Payer: Self-pay | Admitting: Emergency Medicine

## 2021-01-22 MED ORDER — TRAMADOL HCL 50 MG PO TABS: 50.0000 mg | ORAL_TABLET | Freq: Three times a day (TID) | ORAL | 0 refills | Status: AC | PRN

## 2021-01-22 NOTE — Progress Notes (Signed)
Tramadol prescription sent for treatment of headaches.

## 2021-02-02 ENCOUNTER — Encounter: Payer: Self-pay | Admitting: Emergency Medicine

## 2021-02-05 ENCOUNTER — Other Ambulatory Visit: Payer: Self-pay | Admitting: Emergency Medicine

## 2021-02-05 MED ORDER — TRAMADOL HCL 50 MG PO TABS: 50.0000 mg | ORAL_TABLET | Freq: Three times a day (TID) | ORAL | 0 refills | Status: DC | PRN

## 2021-02-05 MED ORDER — TRAMADOL HCL 50 MG PO TABS: 50.0000 mg | ORAL_TABLET | Freq: Three times a day (TID) | ORAL | 0 refills | Status: AC | PRN

## 2021-02-27 DIAGNOSIS — Z9849 Cataract extraction status, unspecified eye: Secondary | ICD-10-CM | POA: Diagnosis not present

## 2021-02-27 DIAGNOSIS — Z87891 Personal history of nicotine dependence: Secondary | ICD-10-CM | POA: Diagnosis not present

## 2021-02-27 DIAGNOSIS — R519 Headache, unspecified: Secondary | ICD-10-CM | POA: Diagnosis not present

## 2021-02-27 DIAGNOSIS — Z7982 Long term (current) use of aspirin: Secondary | ICD-10-CM | POA: Diagnosis not present

## 2021-02-27 DIAGNOSIS — E785 Hyperlipidemia, unspecified: Secondary | ICD-10-CM | POA: Diagnosis not present

## 2021-02-27 DIAGNOSIS — I479 Paroxysmal tachycardia, unspecified: Secondary | ICD-10-CM | POA: Diagnosis not present

## 2021-02-27 DIAGNOSIS — K219 Gastro-esophageal reflux disease without esophagitis: Secondary | ICD-10-CM | POA: Diagnosis not present

## 2021-02-27 DIAGNOSIS — E663 Overweight: Secondary | ICD-10-CM | POA: Diagnosis not present

## 2021-02-27 DIAGNOSIS — I69354 Hemiplegia and hemiparesis following cerebral infarction affecting left non-dominant side: Secondary | ICD-10-CM | POA: Diagnosis not present

## 2021-02-27 DIAGNOSIS — J309 Allergic rhinitis, unspecified: Secondary | ICD-10-CM | POA: Diagnosis not present

## 2021-02-27 DIAGNOSIS — I10 Essential (primary) hypertension: Secondary | ICD-10-CM | POA: Diagnosis not present

## 2021-03-27 ENCOUNTER — Ambulatory Visit (INDEPENDENT_AMBULATORY_CARE_PROVIDER_SITE_OTHER): Payer: PPO | Admitting: Emergency Medicine

## 2021-03-27 ENCOUNTER — Encounter: Payer: Self-pay | Admitting: Emergency Medicine

## 2021-03-27 ENCOUNTER — Other Ambulatory Visit: Payer: Self-pay

## 2021-03-27 VITALS — BP 128/62 | HR 63 | Temp 98.7°F | Ht 72.0 in | Wt 197.0 lb

## 2021-03-27 DIAGNOSIS — E785 Hyperlipidemia, unspecified: Secondary | ICD-10-CM | POA: Diagnosis not present

## 2021-03-27 DIAGNOSIS — Z8673 Personal history of transient ischemic attack (TIA), and cerebral infarction without residual deficits: Secondary | ICD-10-CM

## 2021-03-27 DIAGNOSIS — Z23 Encounter for immunization: Secondary | ICD-10-CM

## 2021-03-27 DIAGNOSIS — Z125 Encounter for screening for malignant neoplasm of prostate: Secondary | ICD-10-CM | POA: Diagnosis not present

## 2021-03-27 DIAGNOSIS — R7303 Prediabetes: Secondary | ICD-10-CM

## 2021-03-27 LAB — COMPREHENSIVE METABOLIC PANEL
ALT: 15 U/L (ref 0–53)
AST: 19 U/L (ref 0–37)
Albumin: 4.6 g/dL (ref 3.5–5.2)
Alkaline Phosphatase: 65 U/L (ref 39–117)
BUN: 13 mg/dL (ref 6–23)
CO2: 28 mEq/L (ref 19–32)
Calcium: 9.8 mg/dL (ref 8.4–10.5)
Chloride: 103 mEq/L (ref 96–112)
Creatinine, Ser: 0.95 mg/dL (ref 0.40–1.50)
GFR: 79.89 mL/min (ref >60.00)
Glucose, Bld: 98 mg/dL (ref 70–99)
Potassium: 4.7 mEq/L (ref 3.5–5.1)
Sodium: 139 mEq/L (ref 135–145)
Total Bilirubin: 1.1 mg/dL (ref 0.2–1.2)
Total Protein: 7.4 g/dL (ref 6.0–8.3)

## 2021-03-27 LAB — LIPID PANEL
Cholesterol: 174 mg/dL (ref 0–200)
HDL: 44.5 mg/dL (ref >39.00)
LDL Cholesterol: 110 mg/dL — ABNORMAL HIGH (ref 0–99)
NonHDL: 129.73
Total CHOL/HDL Ratio: 4
Triglycerides: 101 mg/dL (ref 0.0–149.0)
VLDL: 20.2 mg/dL (ref 0.0–40.0)

## 2021-03-27 LAB — HEMOGLOBIN A1C: Hgb A1c MFr Bld: 6 % (ref 4.6–6.5)

## 2021-03-27 LAB — PSA: PSA: 1.17 ng/mL (ref 0.10–4.00)

## 2021-03-27 NOTE — Assessment & Plan Note (Signed)
Diet and nutrition discussed.  Hemoglobin A1c done today. Advised to decrease amount of daily carbohydrate intake.

## 2021-03-27 NOTE — Patient Instructions (Signed)
Health Maintenance After Age 73 After age 73, you are at a higher risk for certain long-term diseases and infections as well as injuries from falls. Falls are a major cause of broken bones and head injuries in people who are older than age 73. Getting regular preventive care can help to keep you healthy and well. Preventive care includes getting regular testing and making lifestyle changes as recommended by your health care provider. Talk with your health care provider about: Which screenings and tests you should have. A screening is a test that checks for a disease when you have no symptoms. A diet and exercise plan that is right for you. What should I know about screenings and tests to prevent falls? Screening and testing are the best ways to find a health problem early. Early diagnosis and treatment give you the best chance of managing medical conditions that are common after age 73. Certain conditions and lifestyle choices may make you more likely to have a fall. Your health care provider may recommend: Regular vision checks. Poor vision and conditions such as cataracts can make you more likely to have a fall. If you wear glasses, make sure to get your prescription updated if your vision changes. Medicine review. Work with your health care provider to regularly review all of the medicines you are taking, including over-the-counter medicines. Ask your health care provider about any side effects that may make you more likely to have a fall. Tell your health care provider if any medicines that you take make you feel dizzy or sleepy. Strength and balance checks. Your health care provider may recommend certain tests to check your strength and balance while standing, walking, or changing positions. Foot health exam. Foot pain and numbness, as well as not wearing proper footwear, can make you more likely to have a fall. Screenings, including: Osteoporosis screening. Osteoporosis is a condition that causes  the bones to get weaker and break more easily. Blood pressure screening. Blood pressure changes and medicines to control blood pressure can make you feel dizzy. Depression screening. You may be more likely to have a fall if you have a fear of falling, feel depressed, or feel unable to do activities that you used to do. Alcohol use screening. Using too much alcohol can affect your balance and may make you more likely to have a fall. Follow these instructions at home: Lifestyle Do not drink alcohol if: Your health care provider tells you not to drink. If you drink alcohol: Limit how much you have to: 0-1 drink a day for women. 0-2 drinks a day for men. Know how much alcohol is in your drink. In the [place] one drink equals one 12 oz bottle of beer (355 mL), one 5 oz glass of wine (148 mL), or one 1 oz glass of hard liquor (44 mL). Do not use any products that contain nicotine or tobacco. These products include cigarettes, chewing tobacco, and vaping devices, such as e-cigarettes. If you need help quitting, ask your health care provider. Activity  Follow a regular exercise program to stay fit. This will help you maintain your balance. Ask your health care provider what types of exercise are appropriate for you. If you need a cane or walker, use it as recommended by your health care provider. Wear supportive shoes that have nonskid soles. Safety  Remove any tripping hazards, such as rugs, cords, and clutter. Install safety equipment such as grab bars in bathrooms and safety rails on stairs. Keep rooms and walkways   well-lit. General instructions Talk with your health care provider about your risks for falling. Tell your health care provider if: You fall. Be sure to tell your health care provider about all falls, even ones that seem minor. You feel dizzy, tiredness (fatigue), or off-balance. Take over-the-counter and prescription medicines only as told by your health care provider. These include  supplements. Eat a healthy diet and maintain a healthy weight. A healthy diet includes low-fat dairy products, low-fat (lean) meats, and fiber from whole grains, beans, and lots of fruits and vegetables. Stay current with your vaccines. Schedule regular health, dental, and eye exams. Summary Having a healthy lifestyle and getting preventive care can help to protect your health and wellness after age 73. Screening and testing are the best way to find a health problem early and help you avoid having a fall. Early diagnosis and treatment give you the best chance for managing medical conditions that are more common for people who are older than age 73. Falls are a major cause of broken bones and head injuries in people who are older than age 73. Take precautions to prevent a fall at home. Work with your health care provider to learn what changes you can make to improve your health and wellness and to prevent falls. This information is not intended to replace advice given to you by your health care provider. Make sure you discuss any questions you have with your health care provider. Document Revised: 07/17/2020 Document Reviewed: 07/17/2020 Elsevier Patient Education  2022 [employer]  

## 2021-03-27 NOTE — Assessment & Plan Note (Signed)
Stable.  Secondary prevention discussed. Normal blood pressure readings BP Readings from Last 3 Encounters:  03/27/21 128/62  08/16/20 116/62  04/27/20 (!) 149/74  Continue atorvastatin 80 mg daily. Continue daily baby aspirin 81 mg. Diet and nutrition discussed.

## 2021-03-27 NOTE — Assessment & Plan Note (Signed)
Diet and nutrition discussed.  Continue atorvastatin 80 mg daily. 

## 2021-03-27 NOTE — Progress Notes (Signed)
Tyrone Curtis 71 y.o.   Chief Complaint  Patient presents with   Follow-up    6 month f /u on cholesterol    HISTORY OF PRESENT ILLNESS: This is a 71 y.o. male with a history of a stroke, prediabetes, and dyslipidemia, here for follow-up. Also has history of occasional cluster headaches. Presently on atorvastatin 80 mg and daily baby aspirin. Blood pressure under control. No other complaints or medical concerns today. Overall doing very well.  HPI   Prior to Admission medications   Medication Sig Start Date End Date Taking? Authorizing Provider  Acetaminophen (TYLENOL PO) Take by mouth as needed.   Yes [provider]  ASPIRIN 81 PO Take 162 mg by mouth daily.   Yes [provider]  atorvastatin (LIPITOR) 80 MG tablet TAKE 1 TABLET BY MOUTH DAILY 09/25/20  Yes Paolo Okane, Ines Bloomer, MD  FLUZONE HIGH-DOSE QUADRIVALENT 0.7 ML SUSY  12/15/20  Yes [provider]  loratadine (CLARITIN) 10 MG tablet Take 10 mg by mouth daily.   Yes [provider]  Multiple Vitamin (MULTIVITAMINS PO) Take by mouth daily.   Yes [provider]  omeprazole (PRILOSEC) 40 MG capsule TAKE 1 CAPSULE(40 MG) BY MOUTH DAILY 09/25/20  Yes Crystian Frith, Ines Bloomer, MD  traMADol (ULTRAM) 50 MG tablet Take 50 mg by mouth 3 (three) times daily as needed. 03/01/21  Yes [provider]    Allergies  Allergen Reactions   Zyrtec D [Cetirizine-Pseudoephedrine Er]     "talking out of his head"   Other     Patient Active Problem List   Diagnosis Date Noted   Prediabetes 01/02/2018   Overweight (BMI 25.0-29.9) 01/02/2018   History of basal cell carcinoma 08/12/2017   Paroxysmal tachycardia ([place]) 08/23/2016   Smoking history 08/23/2016   Adjustment disorder with mixed anxiety and depressed mood 07/16/2016   Hyperlipidemia 01/06/2014   Gastroesophageal reflux disease without esophagitis 01/06/2014   Hemiparesis affecting left side as late effect of  cerebrovascular accident (CVA) ([place]) 01/06/2014   Insomnia 07/26/2013   Erectile dysfunction 12/16/2011    Past Medical History:  Diagnosis Date   Allergic rhinitis, cause unspecified    Allergy    Anxiety state, unspecified    Arthritis    Basal cell carcinoma    facial   Cataract    Chicken pox    CVA (cerebral vascular accident) ([place])    Depression    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  Symptoms: anemia, hemoccult+.  [facility].   ESOPHAGOGASTRODUODENOSCOPY  09/23/12   normal.  Symptoms: anemia, hemoccult +, GERD.   EYE SURGERY Bilateral    cataract   FRACTURE SURGERY Left    1957 left arm   small bowel mass  1991   removed part of small intestine benign  mass   SMALL INTESTINE SURGERY      Social History   Socioeconomic History   Marital status: Married    Spouse name: [name]   Number of children: 1   Years of education: Not on file   Highest education level: Not on file  Occupational History   Occupation: Disability    Comment: CVA   Occupation: Previous Financial controller of [employer]  Tobacco Use   Smoking status: Former    Packs/day: 2.00    Years: 25.00    Pack years: 50.00    Types: Cigarettes   Smokeless tobacco: Never   Tobacco comments:    quit in 1984  Vaping Use   Vaping Use: Never used  Substance and Sexual Activity   Alcohol use: Yes    Alcohol/week: 6.0  standard drinks    Types: 6 Cans of beer per week    Comment: 6 drinks/week   Drug use: No   Sexual activity: Yes  Other Topics Concern   Not on file  Social History Narrative   Marital status:  Married x 43 years      Children: 1 daughte (45)r; 1 grandson (17)      Lives: with wife; has mountain house and [place] house at [employer]      Employment: disabled since CVA      Tobacco: none      Alcohol:  1-2 drinks per night      Drugs:  None      Exercise: very active in the yard. No formal exercise program in 2019.      ADLs: independent with ADLs; drives locally; maintains own yard.        Advanced Directives:  NONE in 2019.  FULL CODE; no prolonged measures.         Social Determinants of Health   Financial Resource Strain: Low Risk    Difficulty of Paying Living Expenses: Not hard at all  Food Insecurity: No Food Insecurity   Worried About Charity fundraiser in the Last Year: Never true   [place] in the Last Year: Never true  Transportation Needs: No Transportation Needs   Lack of Transportation (Medical): No   Lack of Transportation (Non-Medical): No  Physical Activity: Sufficiently Active   Days of Exercise per Week: 5 days   Minutes of Exercise per Session: 30 min  Stress: No Stress Concern Present   Feeling of Stress : Not at all  Social Connections: Socially Integrated   Frequency of Communication with Friends and Family: More than three times a week   Frequency of Social Gatherings with Friends and Family: More than three times a week   Attends Religious Services: 1 to 4 times per year   Active Member of [employer] or Organizations: Yes   Attends Archivist Meetings: 1 to 4 times per year   Marital Status: Married  Human resources officer Violence: Not At Risk   Fear of Current or Ex-Partner: No   Emotionally Abused: No   Physically Abused: No   Sexually Abused: No    Family History  Problem Relation Age of Onset   Hyperlipidemia Sister    Mental  illness Sister        anxiety   Liver disease Sister    Hyperlipidemia Brother    Diabetes Brother    Arthritis Brother        Knee replacement   Bipolar disorder Brother    Liver disease Brother  Heart disease Brother 5       AMI/CAD with stenting   Diabetes Mother    Depression Mother    Esophageal varices Mother    Hyperlipidemia Mother    Mental illness Mother    Liver disease Mother    Cancer Father        unknown     Review of Systems  Constitutional: Negative.  Negative for chills and fever.  HENT: Negative.  Negative for congestion and sore throat.   Eyes: Negative.   Respiratory: Negative.  Negative for cough and shortness of breath.   Cardiovascular: Negative.  Negative for chest pain and palpitations.  Gastrointestinal:  Negative for abdominal pain, blood in stool, diarrhea, melena, nausea and vomiting.  Genitourinary: Negative.  Negative for dysuria and hematuria.  Skin: Negative.  Negative for rash.  Neurological:  Positive for headaches (Cluster headaches). Negative for dizziness.  All other systems reviewed and are negative.  Today's Vitals   03/27/21 0825  BP: 128/62  Pulse: 63  Temp: 98.7 F (37.1 C)  TempSrc: Oral  SpO2: 98%  Weight: 197 lb (89.4 kg)  Height: 6' (1.829 m)   Body mass index is 26.72 kg/m.  Physical Exam Vitals reviewed.  Constitutional:      Appearance: Normal appearance.  HENT:     Head: Normocephalic.     Right Ear: Tympanic membrane, ear canal and external ear normal.     Left Ear: Tympanic membrane, ear canal and external ear normal.     Mouth/Throat:     Mouth: Mucous membranes are moist.     Pharynx: Oropharynx is clear.  Eyes:     Extraocular Movements: Extraocular movements intact.     Conjunctiva/sclera: Conjunctivae normal.     Pupils: Pupils are equal, round, and reactive to light.  Cardiovascular:     Rate and Rhythm: Normal rate and regular rhythm.     Pulses: Normal pulses.     Heart sounds: Normal  heart sounds.  Pulmonary:     Effort: Pulmonary effort is normal.     Breath sounds: Normal breath sounds.  Abdominal:     General: Bowel sounds are normal. There is no distension.     Palpations: Abdomen is soft.     Tenderness: There is no abdominal tenderness.  Musculoskeletal:        General: Normal range of motion.     Cervical back: Normal range of motion and neck supple.     Right lower leg: No edema.     Left lower leg: No edema.  Skin:    General: Skin is warm and dry.     Capillary Refill: Capillary refill takes less than 2 seconds.  Neurological:     General: No focal deficit present.     Mental Status: He is alert and oriented to person, place, and time.  Psychiatric:        Mood and Affect: Mood normal.        Behavior: Behavior normal.     ASSESSMENT & PLAN: Problem List Items Addressed This Visit       Other   Dyslipidemia - Primary    Diet and nutrition discussed.  Continue atorvastatin 80 mg daily.      Relevant Orders   Comprehensive metabolic panel (Completed)   Lipid panel (Completed)   Prediabetes    Diet and nutrition discussed.  Hemoglobin A1c done today. Advised to decrease amount of daily carbohydrate intake.      Relevant Orders  Comprehensive metabolic panel (Completed)   Hemoglobin A1c (Completed)   History of stroke    Stable.  Secondary prevention discussed. Normal blood pressure readings BP Readings from Last 3 Encounters:  03/27/21 128/62  08/16/20 116/62  04/27/20 (!) 149/74  Continue atorvastatin 80 mg daily. Continue daily baby aspirin 81 mg. Diet and nutrition discussed.       Other Visit Diagnoses     Need for shingles vaccine       Relevant Orders   Varicella-zoster vaccine IM (Shingrix) (Completed)   Prostate cancer screening       Relevant Orders   PSA (Completed)      Patient Instructions  Health Maintenance After Age 42 After age 43, you are at a higher risk for certain long-term diseases and infections  as well as injuries from falls. Falls are a major cause of broken bones and head injuries in people who are older than age 11. Getting regular preventive care can help to keep you healthy and well. Preventive care includes getting regular testing and making lifestyle changes as recommended by your health care provider. Talk with your health care provider about: Which screenings and tests you should have. A screening is a test that checks for a disease when you have no symptoms. A diet and exercise plan that is right for you. What should I know about screenings and tests to prevent falls? Screening and testing are the best ways to find a health problem early. Early diagnosis and treatment give you the best chance of managing medical conditions that are common after age 4. Certain conditions and lifestyle choices may make you more likely to have a fall. Your health care provider may recommend: Regular vision checks. Poor vision and conditions such as cataracts can make you more likely to have a fall. If you wear glasses, make sure to get your prescription updated if your vision changes. Medicine review. Work with your health care provider to regularly review all of the medicines you are taking, including over-the-counter medicines. Ask your health care provider about any side effects that may make you more likely to have a fall. Tell your health care provider if any medicines that you take make you feel dizzy or sleepy. Strength and balance checks. Your health care provider may recommend certain tests to check your strength and balance while standing, walking, or changing positions. Foot health exam. Foot pain and numbness, as well as not wearing proper footwear, can make you more likely to have a fall. Screenings, including: Osteoporosis screening. Osteoporosis is a condition that causes the bones to get weaker and break more easily. Blood pressure screening. Blood pressure changes and medicines to  control blood pressure can make you feel dizzy. Depression screening. You may be more likely to have a fall if you have a fear of falling, feel depressed, or feel unable to do activities that you used to do. Alcohol use screening. Using too much alcohol can affect your balance and may make you more likely to have a fall. Follow these instructions at home: Lifestyle Do not drink alcohol if: Your health care provider tells you not to drink. If you drink alcohol: Limit how much you have to: 0-1 drink a day for women. 0-2 drinks a day for men. Know how much alcohol is in your drink. In the [place] one drink equals one 12 oz bottle of beer (355 mL), one 5 oz glass of wine (148 mL), or one 1 oz glass of hard liquor (44  mL). Do not use any products that contain nicotine or tobacco. These products include cigarettes, chewing tobacco, and vaping devices, such as e-cigarettes. If you need help quitting, ask your health care provider. Activity  Follow a regular exercise program to stay fit. This will help you maintain your balance. Ask your health care provider what types of exercise are appropriate for you. If you need a cane or walker, use it as recommended by your health care provider. Wear supportive shoes that have nonskid soles. Safety  Remove any tripping hazards, such as rugs, cords, and clutter. Install safety equipment such as grab bars in bathrooms and safety rails on stairs. Keep rooms and walkways well-lit. General instructions Talk with your health care provider about your risks for falling. Tell your health care provider if: You fall. Be sure to tell your health care provider about all falls, even ones that seem minor. You feel dizzy, tiredness (fatigue), or off-balance. Take over-the-counter and prescription medicines only as told by your health care provider. These include supplements. Eat a healthy diet and maintain a healthy weight. A healthy diet includes low-fat dairy products,  low-fat (lean) meats, and fiber from whole grains, beans, and lots of fruits and vegetables. Stay current with your vaccines. Schedule regular health, dental, and eye exams. Summary Having a healthy lifestyle and getting preventive care can help to protect your health and wellness after age 11. Screening and testing are the best way to find a health problem early and help you avoid having a fall. Early diagnosis and treatment give you the best chance for managing medical conditions that are more common for people who are older than age 4. Falls are a major cause of broken bones and head injuries in people who are older than age 65. Take precautions to prevent a fall at home. Work with your health care provider to learn what changes you can make to improve your health and wellness and to prevent falls. This information is not intended to replace advice given to you by your health care provider. Make sure you discuss any questions you have with your health care provider. Document Revised: 07/17/2020 Document Reviewed: 07/17/2020 Elsevier Patient Education  2022 [place], MD [facility]

## 2021-04-05 ENCOUNTER — Encounter: Payer: Self-pay | Admitting: Emergency Medicine

## 2021-04-11 ENCOUNTER — Other Ambulatory Visit: Payer: Self-pay | Admitting: Emergency Medicine

## 2021-04-11 DIAGNOSIS — G44011 Episodic cluster headache, intractable: Secondary | ICD-10-CM

## 2021-04-11 DIAGNOSIS — Z8673 Personal history of transient ischemic attack (TIA), and cerebral infarction without residual deficits: Secondary | ICD-10-CM

## 2021-04-11 NOTE — Telephone Encounter (Signed)
Recommend 2 things: #1 brain MRI #2 neurology evaluation Orders placed today for both of these. Follow up with me after these are done. Thanks

## 2021-04-12 NOTE — Progress Notes (Signed)
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  Cautioned to monitor for new/unusual rash and contact us if one develops. Follow up in 6 months.   Subjective:  Tyrone Curtis is a 71 year old right-handed male with history of right PCA stroke who presents for cluster headache.  History supplemented by referring provider's note.  He is also accompanied by his wife who provides collateral history.  He began having intermittent facial pain since around 2005.  He describes a left retroorbital pain radiating to the upper teeth on the left.  It is a 10/10 paroxysmal sharp pain lasting several seconds.  There is associated left ptosis, conjunctival injection, lacrimation, nasal congestion and left cheek swelling.  Left eye appears "sunken".  It is triggered by touching face, chewing, washing face and brushing teeth.  It tends to not be spontaneous.  He may have several months during the year where he no longer has the pain.  However, it is unpredictable when it occurs.  In 2015, he had a CT of the sinuses which showed mild bilateral maxillary sinus disease and was treated with antibiotics for presumed sinus infection.  However pain continued.    It was most recently severe in December but now the pain is less, about 3-4/10.  He takes tramadol as needed.  History of right PCA infarct   PAST MEDICAL HISTORY: Past Medical History:  Diagnosis Date   Allergic rhinitis, cause unspecified    Allergy    Anxiety state, unspecified    Arthritis    Basal cell carcinoma    facial   Cataract    Chicken pox    CVA (cerebral vascular accident) ([place])     Depression    Diaphragmatic hernia without mention of obstruction or gangrene    Dysphagia, unspecified(787.20)    Erectile dysfunction    Herpes zoster without mention of complication    Hyperlipidemia    Phreesia 04/18/2020   Internal hemorrhoids without mention of complication    Measles    Mumps    x 2   Personal history of colonic polyps    Pure hypercholesterolemia    Regurgitation    Stroke ([place])    Phreesia 04/18/2020    PAST SURGICAL HISTORY: Past Surgical History:  Procedure Laterality Date   basal cell carcinoma of anterior chest  2000   resection; [facility]   cataract surgery  2010   with lens repacement Left   COLONOSCOPY  09/23/12   normal.  Symptoms: anemia, hemoccult+.  [facility].   ESOPHAGOGASTRODUODENOSCOPY  09/23/12   normal.  Symptoms: anemia, hemoccult +, GERD.   EYE SURGERY Bilateral    cataract   FRACTURE SURGERY Left    1957 left arm   small bowel mass  1991   removed part of small intestine benign mass   SMALL INTESTINE SURGERY      MEDICATIONS: Current Outpatient Medications on File Prior to Visit  Medication Sig Dispense Refill   Acetaminophen (TYLENOL PO) Take by mouth as needed.     ASPIRIN 81 PO Take 162 mg by mouth daily.     atorvastatin (LIPITOR) 80 MG tablet TAKE 1 TABLET BY MOUTH DAILY 90 tablet 3   FLUZONE HIGH-DOSE  QUADRIVALENT 0.7 ML SUSY      loratadine (CLARITIN) 10 MG tablet Take 10 mg by mouth daily.     Multiple Vitamin (MULTIVITAMINS PO) Take by mouth daily.     omeprazole (PRILOSEC) 40 MG capsule TAKE 1 CAPSULE(40 MG) BY MOUTH DAILY 90 capsule 1   traMADol (ULTRAM) 50 MG tablet Take 50 mg by mouth 3 (three) times daily as needed.     No current facility-administered medications on file prior to visit.    ALLERGIES: Allergies  Allergen Reactions   Zyrtec D [Cetirizine-Pseudoephedrine Er]     "talking out of his head"   Other     FAMILY HISTORY: Family History  Problem Relation Age of Onset    Hyperlipidemia Sister    Mental illness Sister        anxiety   Liver disease Sister    Hyperlipidemia Brother    Diabetes Brother    Arthritis Brother        Knee replacement   Bipolar disorder Brother    Liver disease Brother    Heart disease Brother 109       AMI/CAD with stenting   Diabetes Mother    Depression Mother    Esophageal varices Mother    Hyperlipidemia Mother    Mental illness Mother    Liver disease Mother    Cancer Father        unknown    Objective:  Blood pressure (!) 148/75, pulse 64, height 6' (1.829 m), weight 201 lb 9.6 oz (91.4 kg), SpO2 98 %. General: No acute distress.  Patient appears well-groomed.   Head:  Normocephalic/atraumatic Eyes:  fundi examined but not visualized Neck: supple, no paraspinal tenderness, full range of motion Back: No paraspinal tenderness Heart: regular rate and rhythm Lungs: Clear to auscultation bilaterally. Vascular: No carotid bruits. Neurological Exam: Mental status: alert and oriented to person, place, and time, speech fluent and not dysarthric, language intact. Cranial nerves: CN I: not tested CN II: pupils equal, round and reactive to light, Left homonymous hemianopsia CN III, IV, VI:  full range of motion, no nystagmus, no ptosis CN V: facial sensation intact. CN VII: upper and lower face symmetric CN VIII: reduced hearing bilaterally CN IX, X: gag intact, uvula midline CN XI: sternocleidomastoid and trapezius muscles intact CN XII: tongue midline Bulk & Tone: normal, no fasciculations. Motor:  muscle strength 5/5 throughout Sensation:  Temperature sensation reduced in left upper and lower extremities, vibratory sensation intact Deep Tendon Reflexes:  2+ throughout,  toes downgoing.   Finger to nose testing:  Without dysmetria.  Heel to shin:  Without dysmetria.   Gait:  Normal station and stride.  Romberg negative.    Thank you for allowing me to take part in the care of this patient.  Metta Clines,  DO  CC: Horald Pollen, MD

## 2021-04-13 ENCOUNTER — Other Ambulatory Visit: Payer: Self-pay

## 2021-04-13 ENCOUNTER — Encounter: Payer: Self-pay | Admitting: Neurology

## 2021-04-13 ENCOUNTER — Ambulatory Visit: Payer: PPO | Admitting: Neurology

## 2021-04-13 VITALS — BP 148/75 | HR 64 | Ht 72.0 in | Wt 201.6 lb

## 2021-04-13 DIAGNOSIS — G44059 Short lasting unilateral neuralgiform headache with conjunctival injection and tearing (SUNCT), not intractable: Secondary | ICD-10-CM

## 2021-04-13 MED ORDER — LAMOTRIGINE 25 MG PO TABS: ORAL_TABLET | ORAL | 0 refills | Status: DC

## 2021-04-13 NOTE — Patient Instructions (Addendum)
Start lamotrigine 25mg  tablet:     AM   PM Week 1&2   25mg  (1 tab) Week 3&4   25mg  (1 tab)  25mg  (1 tab) Week 5 and thereafter 50mg  (2 tabs)  50mg  (2 tabs)  At beginning of week 5, call and we will get a level of the medication at the end of that week.  Call if you develop any new unusual rash.  2.  Follow up in 6 months.

## 2021-04-18 ENCOUNTER — Encounter: Payer: Self-pay | Admitting: Emergency Medicine

## 2021-04-21 ENCOUNTER — Other Ambulatory Visit: Payer: Self-pay | Admitting: Emergency Medicine

## 2021-04-21 DIAGNOSIS — K219 Gastro-esophageal reflux disease without esophagitis: Secondary | ICD-10-CM

## 2021-04-26 ENCOUNTER — Other Ambulatory Visit: Payer: Self-pay

## 2021-04-26 ENCOUNTER — Encounter: Payer: Self-pay | Admitting: Neurology

## 2021-04-26 ENCOUNTER — Ambulatory Visit
Admission: RE | Admit: 2021-04-26 | Discharge: 2021-04-26 | Disposition: A | Discharge status: Home / Self Care | Payer: PPO | Source: Ambulatory Visit | Attending: Emergency Medicine | Admitting: Emergency Medicine

## 2021-04-26 DIAGNOSIS — I619 Nontraumatic intracerebral hemorrhage, unspecified: Secondary | ICD-10-CM | POA: Diagnosis not present

## 2021-04-26 DIAGNOSIS — I6782 Cerebral ischemia: Secondary | ICD-10-CM | POA: Diagnosis not present

## 2021-04-26 DIAGNOSIS — G44011 Episodic cluster headache, intractable: Secondary | ICD-10-CM

## 2021-04-26 DIAGNOSIS — I63331 Cerebral infarction due to thrombosis of right posterior cerebral artery: Secondary | ICD-10-CM | POA: Diagnosis not present

## 2021-04-26 DIAGNOSIS — Z8673 Personal history of transient ischemic attack (TIA), and cerebral infarction without residual deficits: Secondary | ICD-10-CM

## 2021-04-26 DIAGNOSIS — R609 Edema, unspecified: Secondary | ICD-10-CM | POA: Diagnosis not present

## 2021-04-26 MED ORDER — GADOBENATE DIMEGLUMINE 529 MG/ML IV SOLN
20.0000 mL | Freq: Once | INTRAVENOUS | Status: AC | PRN
  Administered 2021-04-26: 20 mL via INTRAVENOUS

## 2021-04-26 MED ORDER — TOPIRAMATE 25 MG PO TABS: ORAL_TABLET | ORAL | 3 refills | Status: DC

## 2021-04-26 NOTE — Progress Notes (Signed)
Start topiramate 25mg  - take 1 tablet at bedtime for a week, then increase to 1 tablet twice daily.  If no improvement in 5-6 weeks, contact us and we can increase dose if needed.  Side effects may include numbness and tingling, so he shouldn't be concerned if he begins experiencing this.  It typically improves once his body adjusts to the medication.  He should stay well-hydrated as rarely it may contribute to kidney stones.

## 2021-05-01 ENCOUNTER — Encounter: Payer: Self-pay | Admitting: Neurology

## 2021-05-23 ENCOUNTER — Other Ambulatory Visit: Payer: Self-pay | Admitting: Neurology

## 2021-05-29 DIAGNOSIS — Z85828 Personal history of other malignant neoplasm of skin: Secondary | ICD-10-CM | POA: Diagnosis not present

## 2021-05-29 DIAGNOSIS — D2262 Melanocytic nevi of left upper limb, including shoulder: Secondary | ICD-10-CM | POA: Diagnosis not present

## 2021-05-29 DIAGNOSIS — X32XXXA Exposure to sunlight, initial encounter: Secondary | ICD-10-CM | POA: Diagnosis not present

## 2021-05-29 DIAGNOSIS — L820 Inflamed seborrheic keratosis: Secondary | ICD-10-CM | POA: Diagnosis not present

## 2021-05-29 DIAGNOSIS — L538 Other specified erythematous conditions: Secondary | ICD-10-CM | POA: Diagnosis not present

## 2021-05-29 DIAGNOSIS — D2261 Melanocytic nevi of right upper limb, including shoulder: Secondary | ICD-10-CM | POA: Diagnosis not present

## 2021-05-29 DIAGNOSIS — L570 Actinic keratosis: Secondary | ICD-10-CM | POA: Diagnosis not present

## 2021-05-29 DIAGNOSIS — D225 Melanocytic nevi of trunk: Secondary | ICD-10-CM | POA: Diagnosis not present

## 2021-06-12 ENCOUNTER — Encounter: Payer: Self-pay | Admitting: Emergency Medicine

## 2021-06-12 ENCOUNTER — Encounter: Payer: Self-pay | Admitting: Neurology

## 2021-06-12 NOTE — Telephone Encounter (Signed)
This issue/question needs to be addressed by Dr. Tomi Likens.  Thanks.

## 2021-09-13 ENCOUNTER — Encounter: Payer: PPO | Admitting: Emergency Medicine

## 2021-09-25 DIAGNOSIS — H53461 Homonymous bilateral field defects, right side: Secondary | ICD-10-CM | POA: Diagnosis not present

## 2021-09-25 DIAGNOSIS — H524 Presbyopia: Secondary | ICD-10-CM | POA: Diagnosis not present

## 2021-09-25 DIAGNOSIS — H52223 Regular astigmatism, bilateral: Secondary | ICD-10-CM | POA: Diagnosis not present

## 2021-09-25 DIAGNOSIS — H5203 Hypermetropia, bilateral: Secondary | ICD-10-CM | POA: Diagnosis not present

## 2021-09-25 DIAGNOSIS — H53462 Homonymous bilateral field defects, left side: Secondary | ICD-10-CM | POA: Diagnosis not present

## 2021-09-25 DIAGNOSIS — H43813 Vitreous degeneration, bilateral: Secondary | ICD-10-CM | POA: Diagnosis not present

## 2021-09-25 DIAGNOSIS — Z961 Presence of intraocular lens: Secondary | ICD-10-CM | POA: Diagnosis not present

## 2021-09-26 ENCOUNTER — Encounter: Payer: Self-pay | Admitting: Emergency Medicine

## 2021-09-26 ENCOUNTER — Ambulatory Visit (INDEPENDENT_AMBULATORY_CARE_PROVIDER_SITE_OTHER): Payer: PPO

## 2021-09-26 ENCOUNTER — Ambulatory Visit (INDEPENDENT_AMBULATORY_CARE_PROVIDER_SITE_OTHER): Payer: PPO | Admitting: Emergency Medicine

## 2021-09-26 VITALS — BP 132/78 | HR 61 | Temp 98.1°F | Ht 72.0 in | Wt 196.0 lb

## 2021-09-26 DIAGNOSIS — G8929 Other chronic pain: Secondary | ICD-10-CM

## 2021-09-26 DIAGNOSIS — M25511 Pain in right shoulder: Secondary | ICD-10-CM

## 2021-09-26 DIAGNOSIS — Z1329 Encounter for screening for other suspected endocrine disorder: Secondary | ICD-10-CM | POA: Diagnosis not present

## 2021-09-26 DIAGNOSIS — Z13228 Encounter for screening for other metabolic disorders: Secondary | ICD-10-CM

## 2021-09-26 DIAGNOSIS — Z130 Encounter for screening for diseases of the blood and blood-forming organs and certain disorders involving the immune mechanism: Secondary | ICD-10-CM

## 2021-09-26 DIAGNOSIS — Z13220 Encounter for screening for lipoid disorders: Secondary | ICD-10-CM

## 2021-09-26 DIAGNOSIS — Z0000 Encounter for general adult medical examination without abnormal findings: Secondary | ICD-10-CM

## 2021-09-26 LAB — CBC WITH DIFFERENTIAL/PLATELET
Basophils Absolute: 0 10*3/uL (ref 0.0–0.1)
Basophils Relative: 0.5 % (ref 0.0–3.0)
Eosinophils Absolute: 0.2 10*3/uL (ref 0.0–0.7)
Eosinophils Relative: 2.4 % (ref 0.0–5.0)
HCT: 43.2 % (ref 39.0–52.0)
Hemoglobin: 14.4 g/dL (ref 13.0–17.0)
Lymphocytes Relative: 20.8 % (ref 12.0–46.0)
Lymphs Abs: 1.6 10*3/uL (ref 0.7–4.0)
MCHC: 33.2 g/dL (ref 30.0–36.0)
MCV: 94 fl (ref 78.0–100.0)
Monocytes Absolute: 0.6 10*3/uL (ref 0.1–1.0)
Monocytes Relative: 7.3 % (ref 3.0–12.0)
Neutro Abs: 5.2 10*3/uL (ref 1.4–7.7)
Neutrophils Relative %: 69 % (ref 43.0–77.0)
Platelets: 236 10*3/uL (ref 150.0–400.0)
RBC: 4.6 Mil/uL (ref 4.22–5.81)
RDW: 12.6 % (ref 11.5–15.5)
WBC: 7.5 10*3/uL (ref 4.0–10.5)

## 2021-09-26 LAB — COMPREHENSIVE METABOLIC PANEL
ALT: 16 U/L (ref 0–53)
AST: 21 U/L (ref 0–37)
Albumin: 4.5 g/dL (ref 3.5–5.2)
Alkaline Phosphatase: 67 U/L (ref 39–117)
BUN: 9 mg/dL (ref 6–23)
CO2: 28 mEq/L (ref 19–32)
Calcium: 9.5 mg/dL (ref 8.4–10.5)
Chloride: 104 mEq/L (ref 96–112)
Creatinine, Ser: 0.93 mg/dL (ref 0.40–1.50)
GFR: 81.67 mL/min (ref >60.00)
Glucose, Bld: 92 mg/dL (ref 70–99)
Potassium: 4.4 mEq/L (ref 3.5–5.1)
Sodium: 140 mEq/L (ref 135–145)
Total Bilirubin: 0.9 mg/dL (ref 0.2–1.2)
Total Protein: 7.3 g/dL (ref 6.0–8.3)

## 2021-09-26 LAB — LIPID PANEL
Cholesterol: 133 mg/dL (ref 0–200)
HDL: 41.4 mg/dL (ref >39.00)
LDL Cholesterol: 76 mg/dL (ref 0–99)
NonHDL: 91.25
Total CHOL/HDL Ratio: 3
Triglycerides: 78 mg/dL (ref 0.0–149.0)
VLDL: 15.6 mg/dL (ref 0.0–40.0)

## 2021-09-26 LAB — HEMOGLOBIN A1C: Hgb A1c MFr Bld: 6 % (ref 4.6–6.5)

## 2021-09-26 NOTE — Progress Notes (Addendum)
Tyrone Curtis 71 y.o.   Chief Complaint  Patient presents with  . Physical    Possible bursitis in right arm.     HISTORY OF PRESENT ILLNESS: This is a 71 y.o. male here for annual exam. Injury to right shoulder about 2 months ago.  Hurting since. No other complaints or medical concerns. Otherwise doing well.  HPI   Prior to Admission medications   Medication Sig Start Date End Date Taking? Authorizing Provider  Acetaminophen (TYLENOL PO) Take by mouth as needed.   Yes [provider]  ASPIRIN 81 PO Take 162 mg by mouth daily.   Yes [provider]  atorvastatin (LIPITOR) 80 MG tablet TAKE 1 TABLET BY MOUTH DAILY 09/25/20  Yes Yavonne Kiss, [place], MD  FLUZONE HIGH-DOSE QUADRIVALENT 0.7 ML SUSY  12/15/20  Yes [provider]  lamoTRIgine (LAMICTAL) 25 MG tablet TAKE 1 TABLET EVERY MORNING FOR 14 DAYS THEN TAKE 1 TABLET TWICE DAILY FOR 14 DAYS THEN TAKE 2 TABLETS BY MOUTH TWICE DAILY THEREAFTER 05/30/21  Yes Jaffe, Adam R, DO  loratadine (CLARITIN) 10 MG tablet Take 10 mg by mouth daily.   Yes [provider]  Multiple Vitamin (MULTIVITAMINS PO) Take by mouth daily.   Yes [provider]  omeprazole (PRILOSEC) 40 MG capsule TAKE 1 CAPSULE(40 MG) BY MOUTH DAILY 04/22/21  Yes Quantavia Frith, Ines Bloomer, MD  topiramate (TOPAMAX) 25 MG tablet take 1 tablet at bedtime for a week, then increase to 1 tablet twice daily. 04/26/21  Yes Jaffe, Adam R, DO  traMADol (ULTRAM) 50 MG tablet Take 50 mg by mouth 3 (three) times daily as needed. 03/01/21  Yes [provider]    Allergies  Allergen Reactions  . Zyrtec D [Cetirizine-Pseudoephedrine Er]     "talking out of his head"  . Other     Patient Active Problem List   Diagnosis Date Noted  . History of stroke 03/27/2021  . Prediabetes 01/02/2018  . Overweight (BMI 25.0-29.9) 01/02/2018  . History of basal cell carcinoma 08/12/2017  . Paroxysmal tachycardia ([place]) 08/23/2016  . Smoking  history 08/23/2016  . Adjustment disorder with mixed anxiety and depressed mood 07/16/2016  . Dyslipidemia 01/06/2014  . Gastroesophageal reflux disease without esophagitis 01/06/2014  . Hemiparesis affecting left side as late effect of cerebrovascular accident (CVA) ([place]) 01/06/2014  . Insomnia 07/26/2013  . Erectile dysfunction 12/16/2011    Past Medical History:  Diagnosis Date  . Allergic rhinitis, cause unspecified   . Allergy   . Anxiety state, unspecified   . Arthritis   . Basal cell carcinoma    facial  . Cataract   . Chicken pox   . CVA (cerebral vascular accident) ([place])   . Depression   . 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 Dysphagia, unspecified(787.20)   . Erectile dysfunction   . Herpes zoster without mention of complication   . Hyperlipidemia    Phreesia 04/18/2020  . Internal hemorrhoids without mention of complication   . Measles   . Mumps    x 2  . Personal history of colonic polyps   . Pure hypercholesterolemia   . Regurgitation   . Stroke [facility])    Phreesia 04/18/2020    Past Surgical History:  Procedure Laterality Date  . basal cell carcinoma of anterior chest  2000   resection; [facility]  . cataract surgery  2010   with lens repacement Left  . COLONOSCOPY  09/23/12   normal.  Symptoms:  anemia, hemoccult+.  [facility].  . ESOPHAGOGASTRODUODENOSCOPY  09/23/12   normal.  Symptoms: anemia, hemoccult +, GERD.  [facility] EYE SURGERY Bilateral    cataract  . FRACTURE SURGERY Left    1957 left arm  . small bowel mass  1991   removed part of small intestine benign mass  . SMALL INTESTINE SURGERY      Social History   Socioeconomic History  . Marital status: Married    Spouse name: [name]  . Number of children: 1  . Years of education: Not on file  . Highest education level: Not on file  Occupational History  . Occupation: Disability    Comment: CVA  . Occupation: Previous Financial controller of [employer]  Tobacco Use  .  Smoking status: Former    Packs/day: 2.00    Years: 25.00    Total pack years: 50.00    Types: Cigarettes  . Smokeless tobacco: Never  . Tobacco comments:    quit in 1984  Vaping Use  . Vaping Use: Never used  Substance and Sexual Activity  . Alcohol use: Yes    Alcohol/week: 6.0 standard drinks of alcohol    Types: 6 Cans of beer per week    Comment: 6 drinks/week  . Drug use: No  . Sexual activity: Yes  Other Topics Concern  . Not on file  Social History Narrative   Marital status:  Married x 43 years      Children: 1 daughte (45)r; 1 grandson (13)      Lives: with wife; has mountain house and [place] house at [employer]      Employment: disabled since CVA      Tobacco: none      Alcohol:  1-2 drinks per night      Drugs:  None      Exercise: very active in the yard. No formal exercise program in 2019.      ADLs: independent with ADLs; drives locally; maintains own yard.        Advanced Directives:  NONE in 2019.  FULL CODE; no prolonged measures.         Social Determinants of Health   Financial Resource Strain: Low Risk  (12/04/2020)   Overall Financial Resource Strain (CARDIA)   . Difficulty of Paying Living Expenses: Not hard at all  Food Insecurity: No Food Insecurity (12/04/2020)   Hunger Vital Sign   . Worried About Charity fundraiser in the Last Year: Never true   . Ran Out of Food in the Last Year: Never true  Transportation Needs: No Transportation Needs (12/04/2020)   PRAPARE - Transportation   . Lack of Transportation (Medical): No   . Lack of Transportation (Non-Medical): No  Physical Activity: Sufficiently Active (12/04/2020)   Exercise Vital Sign   . Days of Exercise per Week: 5 days   . Minutes of Exercise per Session: 30 min  Stress: No Stress Concern Present (12/04/2020)   [place]   . Feeling of Stress : Not at all  Social Connections: Socially Integrated (12/04/2020)   Social  Connection and Isolation Panel [NHANES]   . Frequency of Communication with Friends and Family: More than three times a week   . Frequency of Social Gatherings with Friends and Family: More than three times a week   . Attends Religious Services: 1 to 4 times per year   . Active Member of Clubs or Organizations: Yes   . Attends  Club or Organization Meetings: 1 to 4 times per year   . Marital Status: Married  Human resources officer Violence: Not At Risk (12/04/2020)   Humiliation, Afraid, Rape, and Kick questionnaire   . Fear of Current or Ex-Partner: No   . Emotionally Abused: No   . Physically Abused: No   . Sexually Abused: No    Family History  Problem Relation Age of Onset  . Hyperlipidemia Sister   . Mental illness Sister        anxiety  . Liver disease Sister   . Hyperlipidemia Brother   . Diabetes Brother   . Arthritis Brother        Knee replacement  . Bipolar disorder Brother   . Liver disease Brother   . Heart disease Brother 42       AMI/CAD with stenting  . Diabetes Mother   . Depression Mother   . Esophageal varices Mother   . Hyperlipidemia Mother   . Mental illness Mother   . Liver disease Mother   . Cancer Father        unknown     Review of Systems  Constitutional: Negative.  Negative for chills and fever.  HENT: Negative.  Negative for congestion and sore throat.   Respiratory: Negative.  Negative for cough and shortness of breath.   Cardiovascular: Negative.  Negative for chest pain and palpitations.  Gastrointestinal:  Negative for abdominal pain, diarrhea, nausea and vomiting.  Genitourinary: Negative.   Musculoskeletal:  Positive for joint pain (Right shoulder pain).  Skin: Negative.  Negative for rash.  Neurological: Negative.  Negative for dizziness and headaches.  All other systems reviewed and are negative.  Today's Vitals   09/26/21 0803  BP: 132/78  Pulse: 61  Temp: 98.1 F (36.7 C)  TempSrc: Oral  SpO2: 97%  Weight: 196 lb (88.9 kg)   Height: 6' (1.829 m)   Body mass index is 26.58 kg/m. Wt Readings from Last 3 Encounters:  09/26/21 196 lb (88.9 kg)  04/13/21 201 lb 9.6 oz (91.4 kg)  03/27/21 197 lb (89.4 kg)     Physical Exam Vitals reviewed.  Constitutional:      Appearance: Normal appearance.  HENT:     Head: Normocephalic.     Right Ear: Tympanic membrane, ear canal and external ear normal.     Left Ear: Tympanic membrane, ear canal and external ear normal.     Mouth/Throat:     Mouth: Mucous membranes are moist.     Pharynx: Oropharynx is clear.  Eyes:     Extraocular Movements: Extraocular movements intact.     Conjunctiva/sclera: Conjunctivae normal.     Pupils: Pupils are equal, round, and reactive to light.  Cardiovascular:     Rate and Rhythm: Normal rate and regular rhythm.     Pulses: Normal pulses.     Heart sounds: Normal heart sounds.  Pulmonary:     Effort: Pulmonary effort is normal.     Breath sounds: Normal breath sounds.  Abdominal:     Palpations: Abdomen is soft.     Tenderness: There is no abdominal tenderness.  Musculoskeletal:     Cervical back: No tenderness.     Right lower leg: No edema.     Left lower leg: No edema.     Comments: Right shoulder: Limited range of motion due to pain.  No swelling or ecchymosis.  No significant tenderness Right upper extremity: Neurovascularly intact.  Lymphadenopathy:     Cervical: No cervical  adenopathy.  Skin:    General: Skin is warm and dry.     Capillary Refill: Capillary refill takes less than 2 seconds.  Neurological:     General: No focal deficit present.     Mental Status: He is alert and oriented to person, place, and time.  Psychiatric:        Mood and Affect: Mood normal.        Behavior: Behavior normal.     ASSESSMENT & PLAN: Problem List Items Addressed This Visit   None Visit Diagnoses     Routine general medical examination at a health care facility    -  Primary   Screening for deficiency anemia        Relevant Orders   CBC with Differential   Screening for lipoid disorders       Relevant Orders   Lipid panel   Screening for endocrine, metabolic and immunity disorder       Relevant Orders   Comprehensive metabolic panel   Hemoglobin A1c   Chronic right shoulder pain       Relevant Orders   DG Shoulder Right   Ambulatory referral to Sports Medicine      Modifiable risk factors discussed with patient. Anticipatory guidance according to age provided. The following topics were also discussed: Social Determinants of Health Smoking.  Non-smoker Diet and nutrition.  Good eating habits Benefits of exercise.  Staying physically active Cancer screening and review of most recent colonoscopy report Vaccinations recommendations Cardiovascular risk assessment The 10-year ASCVD risk score (Arnett DK, et al., 2019) is: 22.7%   Values used to calculate the score:     Age: 19 years     Sex: Male     Is Non-Hispanic African American: No     Diabetic: No     Tobacco smoker: No     Systolic Blood Pressure: 034 mmHg     Is BP treated: No     HDL Cholesterol: 44.5 mg/dL     Total Cholesterol: 174 mg/dL Differential diagnosis of chronic right shoulder pain, need for x-ray, need for orthopedic evaluation. Mental health including depression and anxiety Fall and accident prevention  Patient Instructions  Health Maintenance, Male Adopting a healthy lifestyle and getting preventive care are important in promoting health and wellness. Ask your health care provider about: The right schedule for you to have regular tests and exams. Things you can do on your own to prevent diseases and keep yourself healthy. What should I know about diet, weight, and exercise? Eat a healthy diet  Eat a diet that includes plenty of vegetables, fruits, low-fat dairy products, and lean protein. Do not eat a lot of foods that are high in solid fats, added sugars, or sodium. Maintain a healthy weight Body mass index  (BMI) is a measurement that can be used to identify possible weight problems. It estimates body fat based on height and weight. Your health care provider can help determine your BMI and help you achieve or maintain a healthy weight. Get regular exercise Get regular exercise. This is one of the most important things you can do for your health. Most adults should: Exercise for at least 150 minutes each week. The exercise should increase your heart rate and make you sweat (moderate-intensity exercise). Do strengthening exercises at least twice a week. This is in addition to the moderate-intensity exercise. Spend less time sitting. Even light physical activity can be beneficial. Watch cholesterol and blood lipids Have your blood tested  for lipids and cholesterol at 71 years of age, then have this test every 5 years. You may need to have your cholesterol levels checked more often if: Your lipid or cholesterol levels are high. You are older than 73 years of age. You are at high risk for heart disease. What should I know about cancer screening? Many types of cancers can be detected early and may often be prevented. Depending on your health history and family history, you may need to have cancer screening at various ages. This may include screening for: Colorectal cancer. Prostate cancer. Skin cancer. Lung cancer. What should I know about heart disease, diabetes, and high blood pressure? Blood pressure and heart disease High blood pressure causes heart disease and increases the risk of stroke. This is more likely to develop in people who have high blood pressure readings or are overweight. Talk with your health care provider about your target blood pressure readings. Have your blood pressure checked: Every 3-5 years if you are 32-40 years of age. Every year if you are 15 years old or older. If you are between the ages of 2 and 33 and are a current or former smoker, ask your health care provider if  you should have a one-time screening for abdominal aortic aneurysm (AAA). Diabetes Have regular diabetes screenings. This checks your fasting blood sugar level. Have the screening done: Once every three years after age 43 if you are at a normal weight and have a low risk for diabetes. More often and at a younger age if you are overweight or have a high risk for diabetes. What should I know about preventing infection? Hepatitis B If you have a higher risk for hepatitis B, you should be screened for this virus. Talk with your health care provider to find out if you are at risk for hepatitis B infection. Hepatitis C Blood testing is recommended for: Everyone born from [birth year] through 1967. Anyone with known risk factors for hepatitis C. Sexually transmitted infections (STIs) You should be screened each year for STIs, including gonorrhea and chlamydia, if: You are sexually active and are younger than 73 years of age. You are older than 73 years of age and your health care provider tells you that you are at risk for this type of infection. Your sexual activity has changed since you were last screened, and you are at increased risk for chlamydia or gonorrhea. Ask your health care provider if you are at risk. Ask your health care provider about whether you are at high risk for HIV. Your health care provider may recommend a prescription medicine to help prevent HIV infection. If you choose to take medicine to prevent HIV, you should first get tested for HIV. You should then be tested every 3 months for as long as you are taking the medicine. Follow these instructions at home: Alcohol use Do not drink alcohol if your health care provider tells you not to drink. If you drink alcohol: Limit how much you have to 0-2 drinks a day. Know how much alcohol is in your drink. In the [place] one drink equals one 12 oz bottle of beer (355 mL), one 5 oz glass of wine (148 mL), or one 1 oz glass of hard liquor (44  mL). Lifestyle Do not use any products that contain nicotine or tobacco. These products include cigarettes, chewing tobacco, and vaping devices, such as e-cigarettes. If you need help quitting, ask your health care provider. Do not use street drugs. Do not  share needles. Ask your health care provider for help if you need support or information about quitting drugs. General instructions Schedule regular health, dental, and eye exams. Stay current with your vaccines. Tell your health care provider if: You often feel depressed. You have ever been abused or do not feel safe at home. Summary Adopting a healthy lifestyle and getting preventive care are important in promoting health and wellness. Follow your health care provider's instructions about healthy diet, exercising, and getting tested or screened for diseases. Follow your health care provider's instructions on monitoring your cholesterol and blood pressure. This information is not intended to replace advice given to you by your health care provider. Make sure you discuss any questions you have with your health care provider. Document Revised: 07/17/2020 Document Reviewed: 07/17/2020 Elsevier Patient Education  [place], MD [facility]

## 2021-09-26 NOTE — Patient Instructions (Signed)
Health Maintenance, Male Adopting a healthy lifestyle and getting preventive care are important in promoting health and wellness. Ask your health care provider about: The right schedule for you to have regular tests and exams. Things you can do on your own to prevent diseases and keep yourself healthy. What should I know about diet, weight, and exercise? Eat a healthy diet  Eat a diet that includes plenty of vegetables, fruits, low-fat dairy products, and lean protein. Do not eat a lot of foods that are high in solid fats, added sugars, or sodium. Maintain a healthy weight Body mass index (BMI) is a measurement that can be used to identify possible weight problems. It estimates body fat based on height and weight. Your health care provider can help determine your BMI and help you achieve or maintain a healthy weight. Get regular exercise Get regular exercise. This is one of the most important things you can do for your health. Most adults should: Exercise for at least 150 minutes each week. The exercise should increase your heart rate and make you sweat (moderate-intensity exercise). Do strengthening exercises at least twice a week. This is in addition to the moderate-intensity exercise. Spend less time sitting. Even light physical activity can be beneficial. Watch cholesterol and blood lipids Have your blood tested for lipids and cholesterol at 71 years of age, then have this test every 5 years. You may need to have your cholesterol levels checked more often if: Your lipid or cholesterol levels are high. You are older than 73 years of age. You are at high risk for heart disease. What should I know about cancer screening? Many types of cancers can be detected early and may often be prevented. Depending on your health history and family history, you may need to have cancer screening at various ages. This may include screening for: Colorectal cancer. Prostate cancer. Skin cancer. Lung  cancer. What should I know about heart disease, diabetes, and high blood pressure? Blood pressure and heart disease High blood pressure causes heart disease and increases the risk of stroke. This is more likely to develop in people who have high blood pressure readings or are overweight. Talk with your health care provider about your target blood pressure readings. Have your blood pressure checked: Every 3-5 years if you are 18-39 years of age. Every year if you are 40 years old or older. If you are between the ages of 65 and 75 and are a current or former smoker, ask your health care provider if you should have a one-time screening for abdominal aortic aneurysm (AAA). Diabetes Have regular diabetes screenings. This checks your fasting blood sugar level. Have the screening done: Once every three years after age 45 if you are at a normal weight and have a low risk for diabetes. More often and at a younger age if you are overweight or have a high risk for diabetes. What should I know about preventing infection? Hepatitis B If you have a higher risk for hepatitis B, you should be screened for this virus. Talk with your health care provider to find out if you are at risk for hepatitis B infection. Hepatitis C Blood testing is recommended for: Everyone born from 1945 through 1965. Anyone with known risk factors for hepatitis C. Sexually transmitted infections (STIs) You should be screened each year for STIs, including gonorrhea and chlamydia, if: You are sexually active and are younger than 73 years of age. You are older than 73 years of age and your   health care provider tells you that you are at risk for this type of infection. Your sexual activity has changed since you were last screened, and you are at increased risk for chlamydia or gonorrhea. Ask your health care provider if you are at risk. Ask your health care provider about whether you are at high risk for HIV. Your health care provider  may recommend a prescription medicine to help prevent HIV infection. If you choose to take medicine to prevent HIV, you should first get tested for HIV. You should then be tested every 3 months for as long as you are taking the medicine. Follow these instructions at home: Alcohol use Do not drink alcohol if your health care provider tells you not to drink. If you drink alcohol: Limit how much you have to 0-2 drinks a day. Know how much alcohol is in your drink. In the [place] one drink equals one 12 oz bottle of beer (355 mL), one 5 oz glass of wine (148 mL), or one 1 oz glass of hard liquor (44 mL). Lifestyle Do not use any products that contain nicotine or tobacco. These products include cigarettes, chewing tobacco, and vaping devices, such as e-cigarettes. If you need help quitting, ask your health care provider. Do not use street drugs. Do not share needles. Ask your health care provider for help if you need support or information about quitting drugs. General instructions Schedule regular health, dental, and eye exams. Stay current with your vaccines. Tell your health care provider if: You often feel depressed. You have ever been abused or do not feel safe at home. Summary Adopting a healthy lifestyle and getting preventive care are important in promoting health and wellness. Follow your health care provider's instructions about healthy diet, exercising, and getting tested or screened for diseases. Follow your health care provider's instructions on monitoring your cholesterol and blood pressure. This information is not intended to replace advice given to you by your health care provider. Make sure you discuss any questions you have with your health care provider. Document Revised: 07/17/2020 Document Reviewed: 07/17/2020 Elsevier Patient Education  2023 Elsevier Inc.  

## 2021-09-28 NOTE — Progress Notes (Unsigned)
Subjective:    CC: R shoulder pain  I, Molly Weber, LAT, ATC, am serving as scribe for Dr. Lynne Leader.  HPI: Pt is a 71 y/o male presenting w/ c/o R shoulder pain x .  He locates his pain to .  Radiating pain: R shoulder mechanical symptoms: Aggravating factors: Treatments tried:   Diagnostic testing: R shoulder XR- 09/26/21  Pertinent review of Systems: ***  Relevant historical information: ***   Objective:   There were no vitals filed for this visit. General: Well Developed, well nourished, and in no acute distress.   MSK: ***  Lab and Radiology Results Results for orders placed or performed in visit on 09/26/21 (from the past 72 hour(s))  CBC with Differential     Status: None   Collection Time: 09/26/21  9:02 AM  Result Value Ref Range   WBC 7.5 4.0 - 10.5 K/uL   RBC 4.60 4.22 - 5.81 Mil/uL   Hemoglobin 14.4 13.0 - 17.0 g/dL   HCT 43.2 39.0 - 52.0 %   MCV 94.0 78.0 - 100.0 fl   MCHC 33.2 30.0 - 36.0 g/dL   RDW 12.6 11.5 - 15.5 %   Platelets 236.0 150.0 - 400.0 K/uL   Neutrophils Relative % 69.0 43.0 - 77.0 %   Lymphocytes Relative 20.8 12.0 - 46.0 %   Monocytes Relative 7.3 3.0 - 12.0 %   Eosinophils Relative 2.4 0.0 - 5.0 %   Basophils Relative 0.5 0.0 - 3.0 %   Neutro Abs 5.2 1.4 - 7.7 K/uL   Lymphs Abs 1.6 0.7 - 4.0 K/uL   Monocytes Absolute 0.6 0.1 - 1.0 K/uL   Eosinophils Absolute 0.2 0.0 - 0.7 K/uL   Basophils Absolute 0.0 0.0 - 0.1 K/uL  Comprehensive metabolic panel     Status: None   Collection Time: 09/26/21  9:02 AM  Result Value Ref Range   Sodium 140 135 - 145 mEq/L   Potassium 4.4 3.5 - 5.1 mEq/L   Chloride 104 96 - 112 mEq/L   CO2 28 19 - 32 mEq/L   Glucose, Bld 92 70 - 99 mg/dL   BUN 9 6 - 23 mg/dL   Creatinine, Ser 0.93 0.40 - 1.50 mg/dL   Total Bilirubin 0.9 0.2 - 1.2 mg/dL   Alkaline Phosphatase 67 39 - 117 U/L   AST 21 0 - 37 U/L   ALT 16 0 - 53 U/L   Total Protein 7.3 6.0 - 8.3 g/dL   Albumin 4.5 3.5 - 5.2 g/dL   GFR  81.67 >60.00 mL/min    Comment: Calculated using the CKD-EPI Creatinine Equation (2021)   Calcium 9.5 8.4 - 10.5 mg/dL  Hemoglobin A1c     Status: None   Collection Time: 09/26/21  9:02 AM  Result Value Ref Range   Hgb A1c MFr Bld 6.0 4.6 - 6.5 %    Comment: Glycemic Control Guidelines for People with Diabetes:Non Diabetic:  <6%Goal of Therapy: <7%Additional Action Suggested:  >8%   Lipid panel     Status: None   Collection Time: 09/26/21  9:02 AM  Result Value Ref Range   Cholesterol 133 0 - 200 mg/dL    Comment: ATP III Classification       Desirable:  < 200 mg/dL               Borderline High:  200 - 239 mg/dL          High:  > = 240 mg/dL  Triglycerides 78.0 0.0 - 149.0 mg/dL    Comment: Normal:  <150 mg/dLBorderline High:  150 - 199 mg/dL   HDL 41.40 >39.00 mg/dL   VLDL 15.6 0.0 - 40.0 mg/dL   LDL Cholesterol 76 0 - 99 mg/dL   Total CHOL/HDL Ratio 3     Comment:                Men          Women1/2 Average Risk     3.4          3.3Average Risk          5.0          4.42X Average Risk          9.6          7.13X Average Risk          15.0          11.0                       NonHDL 91.25     Comment: NOTE:  Non-HDL goal should be 30 mg/dL higher than patient's LDL goal (i.e. LDL goal of < 70 mg/dL, would have non-HDL goal of < 100 mg/dL)   DG Shoulder Right  Result Date: 09/26/2021 CLINICAL DATA:  Right shoulder pain EXAM: RIGHT SHOULDER - 3 VIEW COMPARISON:  None Available. FINDINGS: No evidence of fracture or dislocation. 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 Soft tissues are unremarkable. IMPRESSION: No acute osseous abnormality. Electronically Signed   By: Yetta Glassman M.D.   On: 09/26/2021 09:26      Impression and Recommendations:    Assessment and Plan: 71 y.o. male with ***.  PDMP not reviewed this encounter. No orders of the defined types were placed in this encounter.  No orders of the defined types were placed in  this encounter.   Discussed warning signs or symptoms. Please see discharge instructions. Patient expresses understanding.   ***

## 2021-10-01 ENCOUNTER — Ambulatory Visit: Payer: Self-pay

## 2021-10-01 ENCOUNTER — Ambulatory Visit: Payer: PPO | Admitting: Family Medicine

## 2021-10-01 VITALS — BP 140/78 | HR 65 | Ht 72.0 in | Wt 204.6 lb

## 2021-10-01 DIAGNOSIS — M65341 Trigger finger, right ring finger: Secondary | ICD-10-CM | POA: Diagnosis not present

## 2021-10-01 DIAGNOSIS — M25511 Pain in right shoulder: Secondary | ICD-10-CM | POA: Diagnosis not present

## 2021-10-01 DIAGNOSIS — M65331 Trigger finger, right middle finger: Secondary | ICD-10-CM

## 2021-10-01 DIAGNOSIS — G8929 Other chronic pain: Secondary | ICD-10-CM

## 2021-10-01 MED ORDER — METHYLPREDNISOLONE ACETATE 40 MG/ML IJ SUSP
40.0000 mg | Freq: Once | INTRAMUSCULAR | Status: AC
  Administered 2021-10-01: 40 mg via INTRA_ARTICULAR

## 2021-10-01 NOTE — Patient Instructions (Addendum)
Thank you for coming in today.   You received an injection today. Seek immediate medical attention if the joint becomes red, extremely painful, or is oozing fluid.   I've referred you to Physical Therapy.  Let us know if you don't hear from them in one week.   Check back in October

## 2021-10-10 DIAGNOSIS — R6889 Other general symptoms and signs: Secondary | ICD-10-CM | POA: Diagnosis not present

## 2021-10-10 DIAGNOSIS — R293 Abnormal posture: Secondary | ICD-10-CM | POA: Diagnosis not present

## 2021-10-10 DIAGNOSIS — M6281 Muscle weakness (generalized): Secondary | ICD-10-CM | POA: Diagnosis not present

## 2021-10-10 DIAGNOSIS — M25511 Pain in right shoulder: Secondary | ICD-10-CM | POA: Diagnosis not present

## 2021-10-10 DIAGNOSIS — G8929 Other chronic pain: Secondary | ICD-10-CM | POA: Diagnosis not present

## 2021-10-10 DIAGNOSIS — M25611 Stiffness of right shoulder, not elsewhere classified: Secondary | ICD-10-CM | POA: Diagnosis not present

## 2021-10-16 ENCOUNTER — Other Ambulatory Visit: Payer: Self-pay | Admitting: Emergency Medicine

## 2021-10-16 DIAGNOSIS — E782 Mixed hyperlipidemia: Secondary | ICD-10-CM

## 2021-10-16 DIAGNOSIS — K219 Gastro-esophageal reflux disease without esophagitis: Secondary | ICD-10-CM

## 2021-10-17 DIAGNOSIS — M25611 Stiffness of right shoulder, not elsewhere classified: Secondary | ICD-10-CM | POA: Diagnosis not present

## 2021-10-17 DIAGNOSIS — M25511 Pain in right shoulder: Secondary | ICD-10-CM | POA: Diagnosis not present

## 2021-10-17 DIAGNOSIS — M6281 Muscle weakness (generalized): Secondary | ICD-10-CM | POA: Diagnosis not present

## 2021-10-17 DIAGNOSIS — R293 Abnormal posture: Secondary | ICD-10-CM | POA: Diagnosis not present

## 2021-10-17 DIAGNOSIS — G478 Other sleep disorders: Secondary | ICD-10-CM | POA: Diagnosis not present

## 2021-10-24 DIAGNOSIS — G478 Other sleep disorders: Secondary | ICD-10-CM | POA: Diagnosis not present

## 2021-10-24 DIAGNOSIS — R293 Abnormal posture: Secondary | ICD-10-CM | POA: Diagnosis not present

## 2021-10-24 DIAGNOSIS — M25611 Stiffness of right shoulder, not elsewhere classified: Secondary | ICD-10-CM | POA: Diagnosis not present

## 2021-10-24 DIAGNOSIS — M25511 Pain in right shoulder: Secondary | ICD-10-CM | POA: Diagnosis not present

## 2021-10-24 DIAGNOSIS — M6281 Muscle weakness (generalized): Secondary | ICD-10-CM | POA: Diagnosis not present

## 2021-10-25 ENCOUNTER — Ambulatory Visit: Payer: PPO | Admitting: Neurology

## 2021-11-01 DIAGNOSIS — M25611 Stiffness of right shoulder, not elsewhere classified: Secondary | ICD-10-CM | POA: Diagnosis not present

## 2021-11-01 DIAGNOSIS — G478 Other sleep disorders: Secondary | ICD-10-CM | POA: Diagnosis not present

## 2021-11-01 DIAGNOSIS — M6281 Muscle weakness (generalized): Secondary | ICD-10-CM | POA: Diagnosis not present

## 2021-11-01 DIAGNOSIS — M25511 Pain in right shoulder: Secondary | ICD-10-CM | POA: Diagnosis not present

## 2021-11-01 DIAGNOSIS — R293 Abnormal posture: Secondary | ICD-10-CM | POA: Diagnosis not present

## 2021-11-07 DIAGNOSIS — M6281 Muscle weakness (generalized): Secondary | ICD-10-CM | POA: Diagnosis not present

## 2021-11-07 DIAGNOSIS — M25611 Stiffness of right shoulder, not elsewhere classified: Secondary | ICD-10-CM | POA: Diagnosis not present

## 2021-11-07 DIAGNOSIS — M25511 Pain in right shoulder: Secondary | ICD-10-CM | POA: Diagnosis not present

## 2021-11-07 DIAGNOSIS — G478 Other sleep disorders: Secondary | ICD-10-CM | POA: Diagnosis not present

## 2021-11-07 DIAGNOSIS — R293 Abnormal posture: Secondary | ICD-10-CM | POA: Diagnosis not present

## 2021-11-20 ENCOUNTER — Encounter: Payer: Self-pay | Admitting: Emergency Medicine

## 2021-11-20 NOTE — Telephone Encounter (Signed)
Do not recommend at this time.

## 2021-11-21 DIAGNOSIS — M25511 Pain in right shoulder: Secondary | ICD-10-CM | POA: Diagnosis not present

## 2021-11-21 DIAGNOSIS — G478 Other sleep disorders: Secondary | ICD-10-CM | POA: Diagnosis not present

## 2021-11-21 DIAGNOSIS — M6281 Muscle weakness (generalized): Secondary | ICD-10-CM | POA: Diagnosis not present

## 2021-11-21 DIAGNOSIS — R6889 Other general symptoms and signs: Secondary | ICD-10-CM | POA: Diagnosis not present

## 2021-11-21 DIAGNOSIS — R293 Abnormal posture: Secondary | ICD-10-CM | POA: Diagnosis not present

## 2021-11-21 DIAGNOSIS — R2689 Other abnormalities of gait and mobility: Secondary | ICD-10-CM | POA: Diagnosis not present

## 2021-11-28 DIAGNOSIS — R293 Abnormal posture: Secondary | ICD-10-CM | POA: Diagnosis not present

## 2021-11-28 DIAGNOSIS — M6281 Muscle weakness (generalized): Secondary | ICD-10-CM | POA: Diagnosis not present

## 2021-11-28 DIAGNOSIS — R6889 Other general symptoms and signs: Secondary | ICD-10-CM | POA: Diagnosis not present

## 2021-11-28 DIAGNOSIS — G478 Other sleep disorders: Secondary | ICD-10-CM | POA: Diagnosis not present

## 2021-11-28 DIAGNOSIS — M25511 Pain in right shoulder: Secondary | ICD-10-CM | POA: Diagnosis not present

## 2021-11-28 DIAGNOSIS — R2689 Other abnormalities of gait and mobility: Secondary | ICD-10-CM | POA: Diagnosis not present

## 2021-12-04 DIAGNOSIS — H9193 Unspecified hearing loss, bilateral: Secondary | ICD-10-CM | POA: Diagnosis not present

## 2021-12-04 DIAGNOSIS — M1990 Unspecified osteoarthritis, unspecified site: Secondary | ICD-10-CM | POA: Diagnosis not present

## 2021-12-04 DIAGNOSIS — I69354 Hemiplegia and hemiparesis following cerebral infarction affecting left non-dominant side: Secondary | ICD-10-CM | POA: Diagnosis not present

## 2021-12-04 DIAGNOSIS — K219 Gastro-esophageal reflux disease without esophagitis: Secondary | ICD-10-CM | POA: Diagnosis not present

## 2021-12-04 DIAGNOSIS — G8929 Other chronic pain: Secondary | ICD-10-CM | POA: Diagnosis not present

## 2021-12-04 DIAGNOSIS — H93A3 Pulsatile tinnitus, bilateral: Secondary | ICD-10-CM | POA: Diagnosis not present

## 2021-12-04 DIAGNOSIS — I6782 Cerebral ischemia: Secondary | ICD-10-CM | POA: Diagnosis not present

## 2021-12-04 DIAGNOSIS — I69991 Dysphagia following unspecified cerebrovascular disease: Secondary | ICD-10-CM | POA: Diagnosis not present

## 2021-12-04 DIAGNOSIS — J309 Allergic rhinitis, unspecified: Secondary | ICD-10-CM | POA: Diagnosis not present

## 2021-12-04 DIAGNOSIS — N3281 Overactive bladder: Secondary | ICD-10-CM | POA: Diagnosis not present

## 2021-12-04 DIAGNOSIS — E785 Hyperlipidemia, unspecified: Secondary | ICD-10-CM | POA: Diagnosis not present

## 2021-12-04 DIAGNOSIS — E663 Overweight: Secondary | ICD-10-CM | POA: Diagnosis not present

## 2021-12-05 ENCOUNTER — Ambulatory Visit: Payer: PPO

## 2021-12-05 DIAGNOSIS — M25511 Pain in right shoulder: Secondary | ICD-10-CM | POA: Diagnosis not present

## 2021-12-05 DIAGNOSIS — R6889 Other general symptoms and signs: Secondary | ICD-10-CM | POA: Diagnosis not present

## 2021-12-05 DIAGNOSIS — M6281 Muscle weakness (generalized): Secondary | ICD-10-CM | POA: Diagnosis not present

## 2021-12-05 DIAGNOSIS — R2689 Other abnormalities of gait and mobility: Secondary | ICD-10-CM | POA: Diagnosis not present

## 2021-12-05 DIAGNOSIS — G478 Other sleep disorders: Secondary | ICD-10-CM | POA: Diagnosis not present

## 2021-12-05 DIAGNOSIS — R293 Abnormal posture: Secondary | ICD-10-CM | POA: Diagnosis not present

## 2022-01-07 ENCOUNTER — Ambulatory Visit: Payer: PPO | Admitting: Family Medicine

## 2022-01-07 ENCOUNTER — Ambulatory Visit (INDEPENDENT_AMBULATORY_CARE_PROVIDER_SITE_OTHER): Payer: PPO

## 2022-01-07 ENCOUNTER — Ambulatory Visit: Payer: PPO

## 2022-01-07 VITALS — BP 120/60 | HR 75 | Temp 97.8°F | Ht 72.0 in | Wt 207.4 lb

## 2022-01-07 VITALS — BP 120/68 | HR 78 | Ht 72.0 in | Wt 207.0 lb

## 2022-01-07 DIAGNOSIS — G8929 Other chronic pain: Secondary | ICD-10-CM

## 2022-01-07 DIAGNOSIS — S0181XA Laceration without foreign body of other part of head, initial encounter: Secondary | ICD-10-CM | POA: Diagnosis not present

## 2022-01-07 DIAGNOSIS — M25511 Pain in right shoulder: Secondary | ICD-10-CM | POA: Diagnosis not present

## 2022-01-07 DIAGNOSIS — Z23 Encounter for immunization: Secondary | ICD-10-CM | POA: Diagnosis not present

## 2022-01-07 DIAGNOSIS — Z0000 Encounter for general adult medical examination without abnormal findings: Secondary | ICD-10-CM | POA: Diagnosis not present

## 2022-01-07 DIAGNOSIS — M65341 Trigger finger, right ring finger: Secondary | ICD-10-CM | POA: Diagnosis not present

## 2022-01-07 NOTE — Patient Instructions (Addendum)
[name] , Thank you for taking time to come for your Medicare Wellness Visit. I appreciate your ongoing commitment to your health goals. Please review the following plan we discussed and let me know if I can assist you in the future.   These are the goals we discussed:  Goals      Client understands the importance of follow-up with providers by attending scheduled visits     [place] [facility]        This is a list of the screening recommended for you and due dates:  Health Maintenance  Topic Date Due   Screening for Lung Cancer  Never done   Tetanus Vaccine  04/29/2021   Colon Cancer Screening  09/25/2022   Medicare Annual Wellness Visit  01/08/2023   Pneumonia Vaccine  Completed   Flu Shot  Completed   Hepatitis C Screening: USPSTF Recommendation to screen - Ages 18-79 yo.  Completed   Zoster (Shingles) Vaccine  Completed   HPV Vaccine  Aged Out   COVID-19 Vaccine  Discontinued    Advanced directives: Yes  Conditions/risks identified: Yes  Next appointment: Follow up in one year for your annual wellness visit.   Preventive Care 31 Years and Older, Male  Preventive care refers to lifestyle choices and visits with your health care provider that can promote health and wellness. What does preventive care include? A yearly physical exam. This is also called an annual well check. Dental exams once or twice a year. Routine eye exams. Ask your health care provider how often you should have your eyes checked. Personal lifestyle choices, including: Daily care of your teeth and gums. Regular physical activity. Eating a healthy diet. Avoiding tobacco and drug use. Limiting alcohol use. Practicing safe sex. Taking low doses of aspirin every day. Taking vitamin and mineral supplements as recommended by your health care provider. What happens during an annual well check? The services and screenings done by your health care provider during  your annual well check will depend on your age, overall health, lifestyle risk factors, and family history of disease. Counseling  Your health care provider may ask you questions about your: Alcohol use. Tobacco use. Drug use. Emotional well-being. Home and relationship well-being. Sexual activity. Eating habits. History of falls. Memory and ability to understand (cognition). Work and work Statistician. Screening  You may have the following tests or measurements: Height, weight, and BMI. Blood pressure. Lipid and cholesterol levels. These may be checked every 5 years, or more frequently if you are over 24 years old. Skin check. Lung cancer screening. You may have this screening every year starting at age 32 if you have a 30-pack-year history of smoking and currently smoke or have quit within the past 15 years. Fecal occult blood test (FOBT) of the stool. You may have this test every year starting at age 12. Flexible sigmoidoscopy or colonoscopy. You may have a sigmoidoscopy every 5 years or a colonoscopy every 10 years starting at age 35. Prostate cancer screening. Recommendations will vary depending on your family history and other risks. Hepatitis C blood test. Hepatitis B blood test. Sexually transmitted disease (STD) testing. Diabetes screening. This is done by checking your blood sugar (glucose) after you have not eaten for a while (fasting). You may have this done every 1-3 years. Abdominal aortic aneurysm (AAA) screening. You may need this if you are a current or former smoker. Osteoporosis. You may be screened starting at age 71 if  you are at high risk. Talk with your health care provider about your test results, treatment options, and if necessary, the need for more tests. Vaccines  Your health care provider may recommend certain vaccines, such as: Influenza vaccine. This is recommended every year. Tetanus, diphtheria, and acellular pertussis (Tdap, Td) vaccine. You may need  a Td booster every 10 years. Zoster vaccine. You may need this after age 44. Pneumococcal 13-valent conjugate (PCV13) vaccine. One dose is recommended after age 40. Pneumococcal polysaccharide (PPSV23) vaccine. One dose is recommended after age 68. Talk to your health care provider about which screenings and vaccines you need and how often you need them. This information is not intended to replace advice given to you by your health care provider. Make sure you discuss any questions you have with your health care provider. Document Released: 03/24/2015 Document Revised: 11/15/2015 Document Reviewed: 12/27/2014 Elsevier Interactive Patient Education  2017 [place] Prevention in the Home Falls can cause injuries. They can happen to people of all ages. There are many things you can do to make your home safe and to help prevent falls. What can I do on the outside of my home? Regularly fix the edges of walkways and driveways and fix any cracks. Remove anything that might make you trip as you walk through a door, such as a raised step or threshold. Trim any bushes or trees on the path to your home. Use bright outdoor lighting. Clear any walking paths of anything that might make someone trip, such as rocks or tools. Regularly check to see if handrails are loose or broken. Make sure that both sides of any steps have handrails. Any raised decks and porches should have guardrails on the edges. Have any leaves, snow, or ice cleared regularly. Use sand or salt on walking paths during winter. Clean up any spills in your garage right away. This includes oil or grease spills. What can I do in the bathroom? Use night lights. Install grab bars by the toilet and in the tub and shower. Do not use towel bars as grab bars. Use non-skid mats or decals in the tub or shower. If you need to sit down in the shower, use a plastic, non-slip stool. Keep the floor dry. Clean up any water that spills on the  floor as soon as it happens. Remove soap buildup in the tub or shower regularly. Attach bath mats securely with double-sided non-slip rug tape. Do not have throw rugs and other things on the floor that can make you trip. What can I do in the bedroom? Use night lights. Make sure that you have a light by your bed that is easy to reach. Do not use any sheets or blankets that are too big for your bed. They should not hang down onto the floor. Have a firm chair that has side arms. You can use this for support while you get dressed. Do not have throw rugs and other things on the floor that can make you trip. What can I do in the kitchen? Clean up any spills right away. Avoid walking on wet floors. Keep items that you use a lot in easy-to-reach places. If you need to reach something above you, use a strong step stool that has a grab bar. Keep electrical cords out of the way. Do not use floor polish or wax that makes floors slippery. If you must use wax, use non-skid floor wax. Do not have throw rugs and other things on  the floor that can make you trip. What can I do with my stairs? Do not leave any items on the stairs. Make sure that there are handrails on both sides of the stairs and use them. Fix handrails that are broken or loose. Make sure that handrails are as long as the stairways. Check any carpeting to make sure that it is firmly attached to the stairs. Fix any carpet that is loose or worn. Avoid having throw rugs at the top or bottom of the stairs. If you do have throw rugs, attach them to the floor with carpet tape. Make sure that you have a light switch at the top of the stairs and the bottom of the stairs. If you do not have them, ask someone to add them for you. What else can I do to help prevent falls? Wear shoes that: Do not have high heels. Have rubber bottoms. Are comfortable and fit you well. Are closed at the toe. Do not wear sandals. If you use a stepladder: Make sure that  it is fully opened. Do not climb a closed stepladder. Make sure that both sides of the stepladder are locked into place. Ask someone to hold it for you, if possible. Clearly mark and make sure that you can see: Any grab bars or handrails. First and last steps. Where the edge of each step is. Use tools that help you move around (mobility aids) if they are needed. These include: Canes. Walkers. Scooters. Crutches. Turn on the lights when you go into a dark area. Replace any light bulbs as soon as they burn out. Set up your furniture so you have a clear path. Avoid moving your furniture around. If any of your floors are uneven, fix them. If there are any pets around you, be aware of where they are. Review your medicines with your doctor. Some medicines can make you feel dizzy. This can increase your chance of falling. Ask your doctor what other things that you can do to help prevent falls. This information is not intended to replace advice given to you by your health care provider. Make sure you discuss any questions you have with your health care provider. Document Released: 12/22/2008 Document Revised: 08/03/2015 Document Reviewed: 04/01/2014 Elsevier Interactive Patient Education  2017 [employer].

## 2022-01-07 NOTE — Patient Instructions (Signed)
Thank you for coming in today.   Recheck as needed.   Keep working on the home exercises.

## 2022-01-07 NOTE — Progress Notes (Signed)
Subjective:   Tyrone Curtis is a 71 y.o. male who presents for Medicare Annual/Subsequent preventive examination.  Review of Systems     Cardiac Risk Factors include: advanced age (>46mn, >>55women);dyslipidemia;male gender     Objective:    Today's Vitals   01/07/22 1014  BP: 120/60  Pulse: 75  Temp: 97.8 F (36.6 C)  SpO2: 98%  Weight: 207 lb 6.4 oz (94.1 kg)  Height: 6' (1.829 m)  PainSc: 0-No pain   Body mass index is 28.13 kg/m.     01/07/2022   11:02 AM 12/04/2020   11:27 AM 08/11/2019    8:09 AM 05/17/2019    7:34 PM 12/17/2018   11:11 AM 08/29/2015   10:49 AM 08/01/2014    9:22 AM  Advanced Directives  Does Patient Have a Medical Advance Directive? Yes Yes Yes Yes Yes Yes No  Type of AParamed[place]of [place] will Living will;Healthcare Power of [place] Living will;Healthcare Power of Attorney   Does patient want to make changes to medical advance directive?  No - Patient declined No - Patient declined      Copy of [place] Chart? No - copy requested No - copy requested  No - copy requested No - copy requested No - copy requested   Would patient like information on creating a medical advance directive?    No - Patient declined   No - patient declined information    Current Medications (verified) Outpatient Encounter Medications as of 01/07/2022  Medication Sig   Acetaminophen (TYLENOL PO) Take by mouth as needed.   ASPIRIN 81 PO Take 162 mg by mouth daily.   atorvastatin (LIPITOR) 80 MG tablet TAKE 1 TABLET BY MOUTH DAILY   loratadine (CLARITIN) 10 MG tablet Take 10 mg by mouth daily.   Multiple Vitamin (MULTIVITAMINS PO) Take by mouth daily.   omeprazole (PRILOSEC) 40 MG capsule TAKE 1 CAPSULE(40 MG) BY MOUTH DAILY   traMADol (ULTRAM) 50 MG tablet Take 50 mg by mouth 3 (three) times daily as needed.   [DISCONTINUED]  FLUZONE HIGH-DOSE QUADRIVALENT 0.7 ML SUSY    No facility-administered encounter medications on file as of 01/07/2022.    Allergies (verified) Zyrtec d [cetirizine-pseudoephedrine er] and Other   History: Past Medical History:  Diagnosis Date   Allergic rhinitis, cause unspecified    Allergy    Anxiety state, unspecified    Arthritis    Basal cell carcinoma    facial   Cataract    Chicken pox    CVA (cerebral vascular accident) ([place]    Depression    Diaphragmatic hernia without mention of obstruction or gangrene    Dysphagia, unspecified(787.20)    Erectile dysfunction    Herpes zoster without mention of complication    Hyperlipidemia    Phreesia 04/18/2020   Internal hemorrhoids without mention of complication    Measles    Mumps    x 2   Personal history of colonic polyps    Pure hypercholesterolemia    Regurgitation    Stroke ([facility]    Phreesia 04/18/2020   Past Surgical History:  Procedure Laterality Date   basal cell carcinoma of anterior chest  2000   resection; [place]   cataract surgery  2010   with lens repacement Left   COLONOSCOPY  09/23/12   normal.  Symptoms: anemia, hemoccult+.  [facility].   ESOPHAGOGASTRODUODENOSCOPY  09/23/12  normal.  Symptoms: anemia, hemoccult +, GERD.   EYE SURGERY Bilateral    cataract   FRACTURE SURGERY Left    1957 left arm   small bowel mass  1991   removed part of small intestine benign mass   SMALL INTESTINE SURGERY     Family History  Problem Relation Age of Onset   Hyperlipidemia Sister    Mental illness Sister        anxiety   Liver disease Sister    Hyperlipidemia Brother    Diabetes Brother    Arthritis Brother        Knee replacement   Bipolar disorder Brother    Liver disease Brother    Heart disease Brother 24       AMI/CAD with stenting   Diabetes Mother    Depression Mother    Esophageal varices Mother    Hyperlipidemia Mother    Mental illness Mother    Liver disease Mother     Cancer Father        unknown   Social History   Socioeconomic History   Marital status: Married    Spouse name: [name]   Number of children: 1   Years of education: Not on file   Highest education level: Not on file  Occupational History   Occupation: Disability    Comment: CVA   Occupation: Previous Financial controller of [employer]  Tobacco Use   Smoking status: Former    Packs/day: 2.00    Years: 25.00    Total pack years: 50.00    Types: Cigarettes   Smokeless tobacco: Never   Tobacco comments:    quit in 1984  Vaping Use   Vaping Use: Never used  Substance and Sexual Activity   Alcohol use: Yes    Alcohol/week: 6.0 standard drinks of alcohol    Types: 6 Cans of beer per week    Comment: 6 drinks/week   Drug use: No   Sexual activity: Yes  Other Topics Concern   Not on file  Social History Narrative   Marital status:  Married x 43 years      Children: 1 daughte (45)r; 1 grandson (17)      Lives: with wife; has mountain house and beach house at [employer]      Employment: disabled since CVA      Tobacco: none      Alcohol:  1-2 drinks per night      Drugs:  None      Exercise: very active in the yard. No formal exercise program in 2019.      ADLs: independent with ADLs; drives locally; maintains own yard.        Advanced Directives:  NONE in 2019.  FULL CODE; no prolonged measures.         Social Determinants of Health   Financial Resource Strain: Low Risk  (01/07/2022)   Overall Financial Resource Strain (CARDIA)    Difficulty of Paying Living Expenses: Not hard at all  Food Insecurity: No Food Insecurity (01/07/2022)   Hunger Vital Sign    Worried About Running Out of Food in the Last Year: Never true    Ran Out of Food in the Last Year: Never true  Transportation Needs: No Transportation Needs (01/07/2022)   PRAPARE - Hydrologist (Medical): No    Lack of Transportation (Non-Medical): No  Physical Activity: Sufficiently Active  (01/07/2022)   Exercise Vital Sign    Days of  Exercise per Week: 5 days    Minutes of Exercise per Session: 30 min  Stress: No Stress Concern Present (01/07/2022)   [place]    Feeling of Stress : Not at all  Social Connections: [place] (01/07/2022)   Social Connection and Isolation Panel [NHANES]    Frequency of Communication with Friends and Family: More than three times a week    Frequency of Social Gatherings with Friends and Family: More than three times a week    Attends Religious Services: More than 4 times per year    Active Member of [employer] or Organizations: Yes    Attends Music therapist: More than 4 times per year    Marital Status: Married    Tobacco Counseling Counseling given: Not Answered Tobacco comments: quit in 1984   Clinical Intake:  Pre-visit preparation completed: Yes  Pain : No/denies pain Pain Score: 0-No pain     BMI - recorded: 28.13 Nutritional Status: BMI 25 -29 Overweight Nutritional Risks: None Diabetes: No  How often do you need to have someone help you when you read instructions, pamphlets, or other written materials from your doctor or pharmacy?: 1 - Never What is the last grade level you completed in school?: [employer], B. Degree in Mathematics  Diabetic? no  Interpreter Needed?: No  Information entered by :: Lisette Abu, LPN.   Activities of Daily Living    01/07/2022   11:04 AM  In your present state of health, do you have any difficulty performing the following activities:  Hearing? 0  Vision? 0  Difficulty concentrating or making decisions? 0  Walking or climbing stairs? 0  Dressing or bathing? 0  Doing errands, shopping? 0  Preparing Food and eating ? N  Using the Toilet? N  In the past six months, have you accidently leaked urine? N  Do you have problems with loss of bowel control? N  Managing your Medications? N  Managing  your Finances? N  Housekeeping or managing your Housekeeping? N    Patient Care Team: Horald Pollen, MD as PCP - General (Internal Medicine) Minna Merritts, MD as Consulting Physician (Cardiology) Dasher, Rayvon Char, MD (Dermatology) Vladimir Crofts, MD as Consulting Physician (Neurology) Pieter Partridge, DO as Consulting Physician (Neurology) Idelle Leech, OD as Consulting Physician (Optometry)  Indicate any recent Medical Services you may have received from other than [facility] providers in the past year (date may be approximate).     Assessment:   This is a routine wellness examination for [place].  Hearing/Vision screen Hearing Screening - Comments:: Issues with tinnitus; no hearing aids. Vision Screening - Comments:: Lens implants done.  No rx ey glasses. Eye xam done by: Jomarie Longs, OD.  Dietary issues and exercise activities discussed: Current Exercise Habits: Home exercise routine, Type of exercise: walking;treadmill;stretching;strength training/weights;exercise ball;calisthenics;Other - see comments (working inthe yard & wood shed), Time (Minutes): 30, Frequency (Times/Week): 5, Weekly Exercise (Minutes/Week): 150, Intensity: Moderate, Exercise limited by: None identified   Goals Addressed             This Visit's Progress    Client understands the importance of follow-up with providers by attending scheduled visits       [place] for the Winter Exercise Program      Depression Screen    01/07/2022   10:33 AM 03/27/2021   11:37 AM 12/04/2020   11:37 AM 08/16/2020    8:02 AM  04/27/2020    3:53 PM 04/19/2020    8:59 AM 08/11/2019    8:08 AM  PHQ 2/9 Scores  PHQ - 2 Score 0 0 0 0 0 0 0    Fall Risk    01/07/2022   11:04 AM 03/27/2021   11:37 AM 12/04/2020   11:28 AM 08/16/2020    8:02 AM 04/27/2020    3:53 PM  Fall Risk   Falls in the past year? 0 0 0 1 0  Number falls in past yr: 0 0 0 0   Injury with Fall? 0 0 0 0   Risk for fall due to : No Fall  Risks  No Fall Risks No Fall Risks   Follow up Falls prevention discussed  Falls evaluation completed Falls evaluation completed Falls evaluation completed    [place]:  Any stairs in or around the home? Yes  If so, are there any without handrails? No  Home free of loose throw rugs in walkways, pet beds, electrical cords, etc? Yes  Adequate lighting in your home to reduce risk of falls? Yes   ASSISTIVE DEVICES UTILIZED TO PREVENT FALLS:  Life alert? No  Use of a cane, walker or w/c? No  Grab bars in the bathroom? Yes  Shower chair or bench in shower? Yes  Elevated toilet seat or a handicapped toilet? Yes   TIMED UP AND GO:  Was the test performed? Yes .  Length of time to ambulate 10 feet: 6 sec.   Gait steady and fast without use of assistive device  Cognitive Function:        01/07/2022   11:05 AM 08/11/2019    8:06 AM 12/17/2018   11:20 AM  6CIT Screen  What Year? 0 points 0 points 0 points  What month? 0 points 0 points 0 points  What time? 0 points 0 points 0 points  Count back from 20 0 points 0 points 0 points  Months in reverse 0 points 0 points 0 points  Repeat phrase 0 points 0 points 0 points  Total Score 0 points 0 points 0 points    Immunizations Immunization History  Administered Date(s) Administered   Fluad Quad(high Dose 65+) 12/08/2018   Influenza Split 01/02/2007, 02/06/2009, 04/03/2011, 12/16/2011   Influenza, High Dose Seasonal PF 01/22/2018, 12/11/2019, 12/13/2019   Influenza,inj,Quad PF,6+ Mos 01/03/2014, 01/09/2015, 01/16/2016, 01/20/2017   Influenza-Unspecified 12/04/2012, 12/15/2020   PFIZER Comirnaty(Gray Top)Covid-19 Tri-Sucrose Vaccine 04/20/2019, 05/11/2019, 12/24/2019, 06/16/2020, 12/15/2020   Pneumococcal Conjugate-13 08/01/2014   Pneumococcal Polysaccharide-23 07/16/2016   Tdap 04/30/2011   Zoster Recombinat (Shingrix) 08/16/2020, 03/27/2021   Zoster, Live 12/29/2014    TDAP status: Up to  date  Flu Vaccine status: Completed at today's visit  Pneumococcal vaccine status: Up to date  Covid-19 vaccine status: Completed vaccines  Qualifies for Shingles Vaccine? Yes   Zostavax completed Yes   Shingrix Completed?: Yes  Screening Tests Health Maintenance  Topic Date Due   Lung Cancer Screening  Never done   TETANUS/TDAP  04/29/2021   INFLUENZA VACCINE  10/09/2021   COLONOSCOPY (Pts 45-9yr Insurance coverage will need to be confirmed)  09/25/2022   Medicare Annual Wellness (AWV)  01/08/2023   Pneumonia Vaccine 71 Years old  Completed   Hepatitis C Screening  Completed   Zoster Vaccines- Shingrix  Completed   HPV VACCINES  Aged Out   COVID-19 Vaccine  Discontinued    Health Maintenance  Health Maintenance Due  Topic Date Due  Lung Cancer Screening  Never done   TETANUS/TDAP  04/29/2021   INFLUENZA VACCINE  10/09/2021    Colorectal cancer screening: Type of screening: Colonoscopy. Completed 09/24/2012. Repeat every 10 years  Lung Cancer Screening: (Low Dose CT Chest recommended if Age 68-80 years, 30 pack-year currently smoking OR have quit w/in 15years.) does not qualify.   Lung Cancer Screening Referral: no  Additional Screening:  Hepatitis C Screening: does qualify; Completed 01/09/2015  Vision Screening: Recommended annual ophthalmology exams for early detection of glaucoma and other disorders of the eye. Is the patient up to date with their annual eye exam?  Yes  Who is the provider or what is the name of the office in which the patient attends annual eye exams? Jomarie Longs, OD. If pt is not established with a provider, would they like to be referred to a provider to establish care? No .   Dental Screening: Recommended annual dental exams for proper oral hygiene  Community Resource Referral / Chronic Care Management: CRR required this visit?  No   CCM required this visit?  No      Plan:     I have personally reviewed and noted the following  in the patient's chart:   Medical and social history Use of alcohol, tobacco or illicit drugs  Current medications and supplements including opioid prescriptions. Patient is not currently taking opioid prescriptions. Functional ability and status Nutritional status Physical activity Advanced directives List of other physicians Hospitalizations, surgeries, and ER visits in previous 12 months Vitals Screenings to include cognitive, depression, and falls Referrals and appointments  In addition, I have reviewed and discussed with patient certain preventive protocols, quality metrics, and best practice recommendations. A written personalized care plan for preventive services as well as general preventive health recommendations were provided to patient.     Sheral Flow, LPN   [id number]   Nurse Notes: N/A

## 2022-01-07 NOTE — Progress Notes (Signed)
   I, Peterson Lombard, LAT, ATC acting as a scribe for Lynne Leader, MD.  Tyrone Curtis is a 71 y.o. male who presents to [place] [facility] today for his 73-monthfollow-up of chronic right shoulder pain and trigger finger in his right hand.  Patient was last seen by Dr. 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   Exam:  BP 120/68   Pulse 78   Ht 6' (1.829 m)   Wt 207 lb (93.9 kg)   SpO2 98%   BMI 28.07 kg/m  General: Well Developed, well nourished, and in no acute distress.   MSK: Right shoulder normal appearing normal motion.  Intact strength.  Right hand normal motion and grip.      Assessment and Plan: 71y.o. male with right shoulder pain thought to be due to impingement.  Significant improvement with physical therapy.  Plan to continue home exercise program and check back as needed.  Right fourth digit trigger finger.  Doing well with injection 3 months ago.  Watchful waiting for now.    Recheck back as needed.    Discussed warning signs or symptoms. Please see discharge instructions. Patient expresses understanding.   The above documentation has been reviewed and is accurate and complete ELynne Leader M.D.

## 2022-01-07 NOTE — Progress Notes (Signed)
After obtaining consent, and per orders of Dr. Mitchel Honour, injection of Td given in the left deltoid by Marrian Salvage. Patient instructed to report any adverse reaction to me immediately.

## 2022-01-29 DIAGNOSIS — H524 Presbyopia: Secondary | ICD-10-CM | POA: Diagnosis not present

## 2022-01-29 DIAGNOSIS — D044 Carcinoma in situ of skin of scalp and neck: Secondary | ICD-10-CM | POA: Diagnosis not present

## 2022-01-29 DIAGNOSIS — H53462 Homonymous bilateral field defects, left side: Secondary | ICD-10-CM | POA: Diagnosis not present

## 2022-01-29 DIAGNOSIS — H43813 Vitreous degeneration, bilateral: Secondary | ICD-10-CM | POA: Diagnosis not present

## 2022-01-29 DIAGNOSIS — H52223 Regular astigmatism, bilateral: Secondary | ICD-10-CM | POA: Diagnosis not present

## 2022-01-29 DIAGNOSIS — D0422 Carcinoma in situ of skin of left ear and external auricular canal: Secondary | ICD-10-CM | POA: Diagnosis not present

## 2022-01-29 DIAGNOSIS — D2271 Melanocytic nevi of right lower limb, including hip: Secondary | ICD-10-CM | POA: Diagnosis not present

## 2022-01-29 DIAGNOSIS — D485 Neoplasm of uncertain behavior of skin: Secondary | ICD-10-CM | POA: Diagnosis not present

## 2022-01-29 DIAGNOSIS — D2261 Melanocytic nevi of right upper limb, including shoulder: Secondary | ICD-10-CM | POA: Diagnosis not present

## 2022-01-29 DIAGNOSIS — Z961 Presence of intraocular lens: Secondary | ICD-10-CM | POA: Diagnosis not present

## 2022-01-29 DIAGNOSIS — Z85828 Personal history of other malignant neoplasm of skin: Secondary | ICD-10-CM | POA: Diagnosis not present

## 2022-01-29 DIAGNOSIS — H5203 Hypermetropia, bilateral: Secondary | ICD-10-CM | POA: Diagnosis not present

## 2022-01-29 DIAGNOSIS — X32XXXA Exposure to sunlight, initial encounter: Secondary | ICD-10-CM | POA: Diagnosis not present

## 2022-01-29 DIAGNOSIS — L570 Actinic keratosis: Secondary | ICD-10-CM | POA: Diagnosis not present

## 2022-01-29 DIAGNOSIS — D2262 Melanocytic nevi of left upper limb, including shoulder: Secondary | ICD-10-CM | POA: Diagnosis not present

## 2022-01-29 DIAGNOSIS — H53461 Homonymous bilateral field defects, right side: Secondary | ICD-10-CM | POA: Diagnosis not present

## 2022-03-19 DIAGNOSIS — D0422 Carcinoma in situ of skin of left ear and external auricular canal: Secondary | ICD-10-CM | POA: Diagnosis not present

## 2022-03-28 DIAGNOSIS — D044 Carcinoma in situ of skin of scalp and neck: Secondary | ICD-10-CM | POA: Diagnosis not present

## 2022-04-01 ENCOUNTER — Encounter: Payer: Self-pay | Admitting: Emergency Medicine

## 2022-04-01 ENCOUNTER — Ambulatory Visit (INDEPENDENT_AMBULATORY_CARE_PROVIDER_SITE_OTHER): Payer: PPO | Admitting: Emergency Medicine

## 2022-04-01 VITALS — BP 126/80 | HR 75 | Temp 97.9°F | Ht 72.0 in | Wt 202.2 lb

## 2022-04-01 DIAGNOSIS — Z1211 Encounter for screening for malignant neoplasm of colon: Secondary | ICD-10-CM

## 2022-04-01 DIAGNOSIS — Z8673 Personal history of transient ischemic attack (TIA), and cerebral infarction without residual deficits: Secondary | ICD-10-CM | POA: Diagnosis not present

## 2022-04-01 DIAGNOSIS — R7303 Prediabetes: Secondary | ICD-10-CM

## 2022-04-01 DIAGNOSIS — M25511 Pain in right shoulder: Secondary | ICD-10-CM

## 2022-04-01 DIAGNOSIS — K219 Gastro-esophageal reflux disease without esophagitis: Secondary | ICD-10-CM

## 2022-04-01 DIAGNOSIS — E785 Hyperlipidemia, unspecified: Secondary | ICD-10-CM

## 2022-04-01 DIAGNOSIS — G8929 Other chronic pain: Secondary | ICD-10-CM | POA: Diagnosis not present

## 2022-04-01 LAB — COMPREHENSIVE METABOLIC PANEL
ALT: 21 U/L (ref 0–53)
AST: 23 U/L (ref 0–37)
Albumin: 4.6 g/dL (ref 3.5–5.2)
Alkaline Phosphatase: 71 U/L (ref 39–117)
BUN: 10 mg/dL (ref 6–23)
CO2: 29 mEq/L (ref 19–32)
Calcium: 10 mg/dL (ref 8.4–10.5)
Chloride: 103 mEq/L (ref 96–112)
Creatinine, Ser: 1.05 mg/dL (ref 0.40–1.50)
GFR: 70.34 mL/min (ref >60.00)
Glucose, Bld: 106 mg/dL — ABNORMAL HIGH (ref 70–99)
Potassium: 5.1 mEq/L (ref 3.5–5.1)
Sodium: 139 mEq/L (ref 135–145)
Total Bilirubin: 0.7 mg/dL (ref 0.2–1.2)
Total Protein: 7.7 g/dL (ref 6.0–8.3)

## 2022-04-01 LAB — LIPID PANEL
Cholesterol: 133 mg/dL (ref 0–200)
HDL: 33.7 mg/dL — ABNORMAL LOW (ref >39.00)
LDL Cholesterol: 69 mg/dL (ref 0–99)
NonHDL: 99.33
Total CHOL/HDL Ratio: 4
Triglycerides: 154 mg/dL — ABNORMAL HIGH (ref 0.0–149.0)
VLDL: 30.8 mg/dL (ref 0.0–40.0)

## 2022-04-01 LAB — HEMOGLOBIN A1C: Hgb A1c MFr Bld: 6.1 % (ref 4.6–6.5)

## 2022-04-01 NOTE — Assessment & Plan Note (Signed)
Stable.  Diet and nutrition discussed. Cardiovascular risks associated with prediabetes discussed Hemoglobin A1c done today.

## 2022-04-01 NOTE — Assessment & Plan Note (Signed)
Stable.  Diet and nutrition discussed. Continue atorvastatin 80 mg daily. The 10-year ASCVD risk score (Arnett DK, et al., 2019) is: 19.6%   Values used to calculate the score:     Age: 72 years     Sex: Male     Is Non-Hispanic African American: No     Diabetic: No     Tobacco smoker: No     Systolic Blood Pressure: 446 mmHg     Is BP treated: No     HDL Cholesterol: 41.4 mg/dL     Total Cholesterol: 133 mg/dL

## 2022-04-01 NOTE — Patient Instructions (Signed)
Health Maintenance After Age 74 After age 74, you are at a higher risk for certain long-term diseases and infections as well as injuries from falls. Falls are a major cause of broken bones and head injuries in people who are older than age 74. Getting regular preventive care can help to keep you healthy and well. Preventive care includes getting regular testing and making lifestyle changes as recommended by your health care provider. Talk with your health care provider about: Which screenings and tests you should have. A screening is a test that checks for a disease when you have no symptoms. A diet and exercise plan that is right for you. What should I know about screenings and tests to prevent falls? Screening and testing are the best ways to find a health problem early. Early diagnosis and treatment give you the best chance of managing medical conditions that are common after age 74. Certain conditions and lifestyle choices may make you more likely to have a fall. Your health care provider may recommend: Regular vision checks. Poor vision and conditions such as cataracts can make you more likely to have a fall. If you wear glasses, make sure to get your prescription updated if your vision changes. Medicine review. Work with your health care provider to regularly review all of the medicines you are taking, including over-the-counter medicines. Ask your health care provider about any side effects that may make you more likely to have a fall. Tell your health care provider if any medicines that you take make you feel dizzy or sleepy. Strength and balance checks. Your health care provider may recommend certain tests to check your strength and balance while standing, walking, or changing positions. Foot health exam. Foot pain and numbness, as well as not wearing proper footwear, can make you more likely to have a fall. Screenings, including: Osteoporosis screening. Osteoporosis is a condition that causes  the bones to get weaker and break more easily. Blood pressure screening. Blood pressure changes and medicines to control blood pressure can make you feel dizzy. Depression screening. You may be more likely to have a fall if you have a fear of falling, feel depressed, or feel unable to do activities that you used to do. Alcohol use screening. Using too much alcohol can affect your balance and may make you more likely to have a fall. Follow these instructions at home: Lifestyle Do not drink alcohol if: Your health care provider tells you not to drink. If you drink alcohol: Limit how much you have to: 0-1 drink a day for women. 0-2 drinks a day for men. Know how much alcohol is in your drink. In the [place] one drink equals one 12 oz bottle of beer (355 mL), one 5 oz glass of wine (148 mL), or one 1 oz glass of hard liquor (44 mL). Do not use any products that contain nicotine or tobacco. These products include cigarettes, chewing tobacco, and vaping devices, such as e-cigarettes. If you need help quitting, ask your health care provider. Activity  Follow a regular exercise program to stay fit. This will help you maintain your balance. Ask your health care provider what types of exercise are appropriate for you. If you need a cane or walker, use it as recommended by your health care provider. Wear supportive shoes that have nonskid soles. Safety  Remove any tripping hazards, such as rugs, cords, and clutter. Install safety equipment such as grab bars in bathrooms and safety rails on stairs. Keep rooms and walkways   well-lit. General instructions Talk with your health care provider about your risks for falling. Tell your health care provider if: You fall. Be sure to tell your health care provider about all falls, even ones that seem minor. You feel dizzy, tiredness (fatigue), or off-balance. Take over-the-counter and prescription medicines only as told by your health care provider. These include  supplements. Eat a healthy diet and maintain a healthy weight. A healthy diet includes low-fat dairy products, low-fat (lean) meats, and fiber from whole grains, beans, and lots of fruits and vegetables. Stay current with your vaccines. Schedule regular health, dental, and eye exams. Summary Having a healthy lifestyle and getting preventive care can help to protect your health and wellness after age 74. Screening and testing are the best way to find a health problem early and help you avoid having a fall. Early diagnosis and treatment give you the best chance for managing medical conditions that are more common for people who are older than age 74. Falls are a major cause of broken bones and head injuries in people who are older than age 74. Take precautions to prevent a fall at home. Work with your health care provider to learn what changes you can make to improve your health and wellness and to prevent falls. This information is not intended to replace advice given to you by your health care provider. Make sure you discuss any questions you have with your health care provider. Document Revised: 07/17/2020 Document Reviewed: 07/17/2020 Elsevier Patient Education  2023 Elsevier Inc.  

## 2022-04-01 NOTE — Assessment & Plan Note (Signed)
Secondary prevention discussed. Blood pressure well-controlled. Continue atorvastatin 80 mg daily History of prediabetes.  Well-controlled. Diet and nutrition discussed. Benefits of exercise discussed. Continue baby aspirin daily

## 2022-04-01 NOTE — Assessment & Plan Note (Signed)
Much improved with physical therapy

## 2022-04-01 NOTE — Assessment & Plan Note (Signed)
Stable.  Continue taking daily omeprazole 40 mg Tried stopping it but symptoms came back

## 2022-04-01 NOTE — Progress Notes (Signed)
Tyrone Curtis 72 y.o.   Chief Complaint  Patient presents with   Follow-up    68mth f/u appt, sinus issues     HISTORY OF PRESENT ILLNESS: This is a 72y.o. male here for 689-monthollow-up Occasional sinus congestion Overall doing well No other complaints or medical concerns today.  HPI   Prior to Admission medications   Medication Sig Start Date End Date Taking? Authorizing Provider  Acetaminophen (TYLENOL PO) Take by mouth as needed.   Yes [provider]  ASPIRIN 81 PO Take 162 mg by mouth daily.   Yes [provider]  atorvastatin (LIPITOR) 80 MG tablet TAKE 1 TABLET BY MOUTH DAILY 10/17/21  Yes [name], MiInes BloomerMD  loratadine (CLARITIN) 10 MG tablet Take 10 mg by mouth daily.   Yes [provider]  Multiple Vitamin (MULTIVITAMINS PO) Take by mouth daily.   Yes [provider]  omeprazole (PRILOSEC) 40 MG capsule TAKE 1 CAPSULE(40 MG) BY MOUTH DAILY 10/17/21  Yes [name], MiInes BloomerMD  traMADol (ULTRAM) 50 MG tablet Take 50 mg by mouth 3 (three) times daily as needed. 03/01/21  Yes [provider]    Allergies  Allergen Reactions   Zyrtec D [Cetirizine-Pseudoephedrine Er]     "talking out of his head"   Other     Patient Active Problem List   Diagnosis Date Noted   Trigger ring finger of right hand 10/01/2021   Chronic right shoulder pain 10/01/2021   History of stroke 03/27/2021   Prediabetes 01/02/2018   Overweight (BMI 25.0-29.9) 01/02/2018   History of basal cell carcinoma 08/12/2017   Par[place]mal tachycardia ([place]/15/2018   Smoking history 08/23/2016   Adjustment disorder with mixed anxiety and depressed mood 07/16/2016   Dyslipidemia 01/06/2014   Gastroesophageal reflux disease without esophagitis 01/06/2014   Hemiparesis affecting left side as late effect of cerebrovascular accident (CVA) ([place]/29/2015   Insomnia 07/26/2013   Erectile dysfunction 12/16/2011    Past Medical History:   Diagnosis Date   Allergic rhinitis, cause unspecified    Allergy    Anxiety state, unspecified    Arthritis    Basal cell carcinoma    facial   Cataract    Chicken pox    CVA (cerebral vascular accident) ([facility]   Depression    Diaphragmatic hernia without mention of obstruction or gangrene    Dysphagia, unspecified(787.20)    Erectile dysfunction    Herpes zoster without mention of complication    Hyperlipidemia    Phreesia 04/18/2020   Internal hemorrhoids without mention of complication    Measles    Mumps    x 2   Personal history of colonic polyps    Pure hypercholesterolemia    Regurgitation    Stroke ([place]   Phreesia 04/18/2020    Past Surgical History:  Procedure Laterality Date   basal cell carcinoma of anterior chest  2000   resection; [place]   cataract surgery  2010   with lens repacement Left   COLONOSCOPY  09/23/12   normal.  Symptoms: anemia, hemoccult+.  [facility].   ESOPHAGOGASTRODUODENOSCOPY  09/23/12   normal.  Symptoms: anemia, hemoccult +, GERD.   EYE SURGERY Bilateral    cataract   FRACTURE SURGERY Left    1957 left arm   small bowel mass  1991   removed part of small intestine benign mass   SMALL INTESTINE SURGERY      Social History   Socioeconomic History  Marital status: Married    Spouse name: Tyrone Curtis   Number of children: 1   Years of education: Not on file   Highest education level: Not on file  Occupational History   Occupation: Disability    Comment: CVA   Occupation: Previous Financial controller of [employer]  Tobacco Use   Smoking status: Former    Packs/day: 2.00    Years: 25.00    Total pack years: 50.00    Types: Cigarettes   Smokeless tobacco: Never   Tobacco comments:    quit in 1984  Vaping Use   Vaping Use: Never used  Substance and Sexual Activity   Alcohol use: Yes    Alcohol/week: 6.0 standard drinks of alcohol    Types: 6 Cans of beer per week    Comment: 6 drinks/week   Drug use: No   Sexual activity: Yes   Other Topics Concern   Not on file  Social History Narrative   Marital status:  Married x 43 years      Children: 1 daughte (45)r; 1 grandson (17)      Lives: with wife; has mountain house and [place] house at [employer]      Employment: disabled since CVA      Tobacco: none      Alcohol:  1-2 drinks per night      Drugs:  None      Exercise: very active in the yard. No formal exercise program in 2019.      ADLs: independent with ADLs; drives locally; maintains own yard.        Advanced Directives:  NONE in 2019.  FULL CODE; no prolonged measures.         Social Determinants of Health   Financial Resource Strain: Low Risk  (01/07/2022)   Overall Financial Resource Strain (CARDIA)    Difficulty of Paying Living Expenses: Not hard at all  Food Insecurity: No Food Insecurity (01/07/2022)   Hunger Vital Sign    Worried About Running Out of Food in the Last Year: Never true    Ran Out of Food in the Last Year: Never true  Transportation Needs: No Transportation Needs (01/07/2022)   PRAPARE - Hydrologist (Medical): No    Lack of Transportation (Non-Medical): No  Physical Activity: Sufficiently Active (01/07/2022)   Exercise Vital Sign    Days of Exercise per Week: 5 days    Minutes of Exercise per Session: 30 min  Stress: No Stress Concern Present (01/07/2022)   [place]    Feeling of Stress : Not at all  Social Connections: [place] (01/07/2022)   Social Connection and Isolation Panel [NHANES]    Frequency of Communication with Friends and Family: More than three times a week    Frequency of Social Gatherings with Friends and Family: More than three times a week    Attends Religious Services: More than 4 times per year    Active Member of [employer] or Organizations: Yes    Attends Music therapist: More than 4 times per year    Marital Status: Married  Arboriculturist Violence: Not At Risk (01/07/2022)   Humiliation, Afraid, Rape, and Kick questionnaire    Fear of Current or Ex-Partner: No    Emotionally Abused: No    Physically Abused: No    Sexually Abused: No    Family History  Problem Relation Age of Onset   Hyperlipidemia  Sister    Mental illness Sister        anxiety   Liver disease Sister    Hyperlipidemia Brother    Diabetes Brother    Arthritis Brother        Knee replacement   Bipolar disorder Brother    Liver disease Brother    Heart disease Brother 67       AMI/CAD with stenting   Diabetes Mother    Depression Mother    Esophageal varices Mother    Hyperlipidemia Mother    Mental illness Mother    Liver disease Mother    Cancer Father        unknown     Review of Systems  Constitutional: Negative.  Negative for chills and fever.  HENT:  Positive for congestion. Negative for sore throat.   Respiratory: Negative.  Negative for cough and shortness of breath.   Cardiovascular: Negative.  Negative for chest pain and palpitations.  Gastrointestinal:  Negative for abdominal pain, diarrhea, nausea and vomiting.  Genitourinary: Negative.  Negative for dysuria and hematuria.  Skin: Negative.  Negative for rash.  Neurological: Negative.  Negative for dizziness and headaches.  All other systems reviewed and are negative.  Today's Vitals   04/01/22 0825  BP: 126/80  Pulse: 75  Temp: 97.9 F (36.6 C)  TempSrc: Oral  SpO2: 96%  Weight: 202 lb 4 oz (91.7 kg)  Height: 6' (1.829 m)   Body mass index is 27.43 kg/m.   Physical Exam Vitals reviewed.  Constitutional:      Appearance: Normal appearance.  HENT:     Head: Normocephalic.     Mouth/Throat:     Mouth: Mucous membranes are moist.     Pharynx: Oropharynx is clear.  Eyes:     Extraocular Movements: Extraocular movements intact.     Conjunctiva/sclera: Conjunctivae normal.     Pupils: Pupils are equal, round, and reactive to light.  Cardiovascular:      Rate and Rhythm: Normal rate and regular rhythm.     Pulses: Normal pulses.     Heart sounds: Normal heart sounds.  Pulmonary:     Effort: Pulmonary effort is normal.     Breath sounds: Normal breath sounds.  Abdominal:     Palpations: Abdomen is soft.     Tenderness: There is no abdominal tenderness.  Musculoskeletal:     Cervical back: No tenderness.  Lymphadenopathy:     Cervical: No cervical adenopathy.  Skin:    General: Skin is warm and dry.  Neurological:     General: No focal deficit present.     Mental Status: He is alert and oriented to person, place, and time.  Psychiatric:        Mood and Affect: Mood normal.        Behavior: Behavior normal.      ASSESSMENT & PLAN: A total of 45 minutes was spent with the patient and counseling/coordination of care regarding preparing for this visit, review of most recent office visit notes, review of multiple chronic medical conditions under management, review of all medications, cardiovascular risks associated with prediabetes and dyslipidemia, secondary prevention of stroke, education on nutrition, review of most recent blood work results, prognosis, documentation, and need for follow-up  Problem List Items Addressed This Visit       Digestive   Gastroesophageal reflux disease without esophagitis    Stable.  Continue taking daily omeprazole 40 mg Tried stopping it but symptoms came back  Other   Dyslipidemia - Primary    Stable.  Diet and nutrition discussed. Continue atorvastatin 80 mg daily. The 10-year ASCVD risk score (Arnett DK, et al., 2019) is: 19.6%   Values used to calculate the score:     Age: 86 years     Sex: Male     Is Non-Hispanic African American: No     Diabetic: No     Tobacco smoker: No     Systolic Blood Pressure: 858 mmHg     Is BP treated: No     HDL Cholesterol: 41.4 mg/dL     Total Cholesterol: 133 mg/dL       Relevant Orders   Comprehensive metabolic panel   Lipid panel    Prediabetes    Stable.  Diet and nutrition discussed. Cardiovascular risks associated with prediabetes discussed Hemoglobin A1c done today.      Relevant Orders   Comprehensive metabolic panel   Hemoglobin A1c   History of stroke    Secondary prevention discussed. Blood pressure well-controlled. Continue atorvastatin 80 mg daily History of prediabetes.  Well-controlled. Diet and nutrition discussed. Benefits of exercise discussed. Continue baby aspirin daily      Chronic right shoulder pain    Much improved with physical therapy      Other Visit Diagnoses     Colon cancer screening       Relevant Orders   Cologuard        Patient Instructions  Health Maintenance After Age 77 After age 90, you are at a higher risk for certain long-term diseases and infections as well as injuries from falls. Falls are a major cause of broken bones and head injuries in people who are older than age 34. Getting regular preventive care can help to keep you healthy and well. Preventive care includes getting regular testing and making lifestyle changes as recommended by your health care provider. Talk with your health care provider about: Which screenings and tests you should have. A screening is a test that checks for a disease when you have no symptoms. A diet and exercise plan that is right for you. What should I know about screenings and tests to prevent falls? Screening and testing are the best ways to find a health problem early. Early diagnosis and treatment give you the best chance of managing medical conditions that are common after age 37. Certain conditions and lifestyle choices may make you more likely to have a fall. Your health care provider may recommend: Regular vision checks. Poor vision and conditions such as cataracts can make you more likely to have a fall. If you wear glasses, make sure to get your prescription updated if your vision changes. Medicine review. Work with your  health care provider to regularly review all of the medicines you are taking, including over-the-counter medicines. Ask your health care provider about any side effects that may make you more likely to have a fall. Tell your health care provider if any medicines that you take make you feel dizzy or sleepy. Strength and balance checks. Your health care provider may recommend certain tests to check your strength and balance while standing, walking, or changing positions. Foot health exam. Foot pain and numbness, as well as not wearing proper footwear, can make you more likely to have a fall. Screenings, including: Osteoporosis screening. Osteoporosis is a condition that causes the bones to get weaker and break more easily. Blood pressure screening. Blood pressure changes and medicines to control blood pressure can  make you feel dizzy. Depression screening. You may be more likely to have a fall if you have a fear of falling, feel depressed, or feel unable to do activities that you used to do. Alcohol use screening. Using too much alcohol can affect your balance and may make you more likely to have a fall. Follow these instructions at home: Lifestyle Do not drink alcohol if: Your health care provider tells you not to drink. If you drink alcohol: Limit how much you have to: 0-1 drink a day for women. 0-2 drinks a day for men. Know how much alcohol is in your drink. In the [place] one drink equals one 12 oz bottle of beer (355 mL), one 5 oz glass of wine (148 mL), or one 1 oz glass of hard liquor (44 mL). Do not use any products that contain nicotine or tobacco. These products include cigarettes, chewing tobacco, and vaping devices, such as e-cigarettes. If you need help quitting, ask your health care provider. Activity  Follow a regular exercise program to stay fit. This will help you maintain your balance. Ask your health care provider what types of exercise are appropriate for you. If you need a  cane or walker, use it as recommended by your health care provider. Wear supportive shoes that have nonskid soles. Safety  Remove any tripping hazards, such as rugs, cords, and clutter. Install safety equipment such as grab bars in bathrooms and safety rails on stairs. Keep rooms and walkways well-lit. General instructions Talk with your health care provider about your risks for falling. Tell your health care provider if: You fall. Be sure to tell your health care provider about all falls, even ones that seem minor. You feel dizzy, tiredness (fatigue), or off-balance. Take over-the-counter and prescription medicines only as told by your health care provider. These include supplements. Eat a healthy diet and maintain a healthy weight. A healthy diet includes low-fat dairy products, low-fat (lean) meats, and fiber from whole grains, beans, and lots of fruits and vegetables. Stay current with your vaccines. Schedule regular health, dental, and eye exams. Summary Having a healthy lifestyle and getting preventive care can help to protect your health and wellness after age 74. Screening and testing are the best way to find a health problem early and help you avoid having a fall. Early diagnosis and treatment give you the best chance for managing medical conditions that are more common for people who are older than age 37. Falls are a major cause of broken bones and head injuries in people who are older than age 9. Take precautions to prevent a fall at home. Work with your health care provider to learn what changes you can make to improve your health and wellness and to prevent falls. This information is not intended to replace advice given to you by your health care provider. Make sure you discuss any questions you have with your health care provider. Document Revised: 07/17/2020 Document Reviewed: 07/17/2020 Elsevier Patient Education  [place], [place] [facility]

## 2022-04-08 DIAGNOSIS — D0439 Carcinoma in situ of skin of other parts of face: Secondary | ICD-10-CM | POA: Diagnosis not present

## 2022-04-11 DIAGNOSIS — Z1211 Encounter for screening for malignant neoplasm of colon: Secondary | ICD-10-CM | POA: Diagnosis not present

## 2022-04-23 LAB — COLOGUARD: COLOGUARD: NEGATIVE

## 2022-04-30 ENCOUNTER — Telehealth: Payer: Self-pay | Admitting: Emergency Medicine

## 2022-04-30 DIAGNOSIS — Z1211 Encounter for screening for malignant neoplasm of colon: Secondary | ICD-10-CM

## 2022-04-30 NOTE — Telephone Encounter (Signed)
New referral to GI for colonoscopy placed. Patient notified

## 2022-04-30 NOTE — Telephone Encounter (Signed)
Pt called wanted to know if he could get appt to get a colonoscopy upcoming appt in July for a physical.  Call pt back with update

## 2022-05-06 ENCOUNTER — Encounter: Payer: Self-pay | Admitting: Emergency Medicine

## 2022-05-06 DIAGNOSIS — K219 Gastro-esophageal reflux disease without esophagitis: Secondary | ICD-10-CM

## 2022-05-06 MED ORDER — OMEPRAZOLE 40 MG PO CPDR: DELAYED_RELEASE_CAPSULE | ORAL | 1 refills | Status: DC

## 2022-08-13 ENCOUNTER — Telehealth: Payer: Self-pay

## 2022-08-13 NOTE — Telephone Encounter (Signed)
Called patient lvm to return call, to complete AWV at [phone].  If no return call within 15 minutes, patient may reschedule for the next available appointment with [facility] or [facility].  Caydin Yeatts N. Chin Wachter, LPN. [facility] AWV Team Direct Dial: [phone]  

## 2022-08-27 ENCOUNTER — Ambulatory Visit (INDEPENDENT_AMBULATORY_CARE_PROVIDER_SITE_OTHER): Payer: PPO

## 2022-08-27 VITALS — Ht 72.0 in | Wt 197.0 lb

## 2022-08-27 DIAGNOSIS — Z0000 Encounter for general adult medical examination without abnormal findings: Secondary | ICD-10-CM

## 2022-08-27 NOTE — Progress Notes (Signed)
Subjective:   Tyrone Curtis is a 72 y.o. male who presents for Medicare Annual/Subsequent preventive examination.  I connected with  Tyrone Curtis on 08/27/22 by a audio enabled telemedicine application and verified that I am speaking with the correct person using two identifiers.   Patient Location: Home  Provider Location: Office/Clinic  I discussed the limitations of evaluation and management by telemedicine. The patient expressed understanding and agreed to proceed.  Patient Medicare AWV questionnaire was completed by the patient on 08/26/2022; I have confirmed that all information answered by patient is correct and no changes since this date.  Review of Systems     Cardiac Risk Factors include: advanced age (>60men, >34 women);dyslipidemia;family history of premature cardiovascular disease;male gender     Objective:    Today's Vitals   08/27/22 0942  Weight: 197 lb (89.4 kg)  Height: 6' (1.829 m)  PainSc: 5   PainLoc: Mouth   Body mass index is 26.72 kg/m.     08/27/2022    9:43 AM 01/07/2022   11:02 AM 12/04/2020   11:27 AM 08/11/2019    8:09 AM 05/17/2019    7:34 PM 12/17/2018   11:11 AM 08/29/2015   10:49 AM  Advanced Directives  Does Patient Have a Medical Advance Directive? Yes Yes Yes Yes Yes Yes Yes  Type of Estate agent of [place];Living will Healthcare Power of [place];Living will Living will;Healthcare Power of [employer] Power of [place];Living will Healthcare Power of [place];Living will Living will;Healthcare Power of Attorney  Does patient want to make changes to medical advance directive?   No - Patient declined No - Patient declined     Copy of Healthcare Power of Attorney in Chart? No - copy requested No - copy requested No - copy requested  No - copy requested No - copy requested No - copy requested  Would patient like information on creating a medical advance directive?     No - Patient declined       Current Medications (verified) Outpatient Encounter Medications as of 08/27/2022  Medication Sig   Acetaminophen (TYLENOL PO) Take by mouth as needed.   ASPIRIN 81 PO Take 162 mg by mouth daily.   atorvastatin (LIPITOR) 80 MG tablet TAKE 1 TABLET BY MOUTH DAILY   loratadine (CLARITIN) 10 MG tablet Take 10 mg by mouth daily.   Multiple Vitamin (MULTIVITAMINS PO) Take by mouth daily.   omeprazole (PRILOSEC) 40 MG capsule Take 1 capsule by mouth daily   traMADol (ULTRAM) 50 MG tablet Take 50 mg by mouth 3 (three) times daily as needed.   No facility-administered encounter medications on file as of 08/27/2022.    Allergies (verified) Zyrtec d [cetirizine-pseudoephedrine er] and Other   History: Past Medical History:  Diagnosis Date   Allergic rhinitis, cause unspecified    Allergy    Anxiety state, unspecified    Arthritis    Basal cell carcinoma    facial   Cataract    Chicken pox    CVA (cerebral vascular accident) (HCC)    Depression    Diaphragmatic hernia without mention of obstruction or gangrene    Dysphagia, unspecified(787.20)    Erectile dysfunction    Herpes zoster without mention of complication    Hyperlipidemia    Phreesia 04/18/2020   Internal hemorrhoids without mention of complication    Measles    Mumps    x 2   Personal history of colonic polyps    Pure hypercholesterolemia  Regurgitation    Stroke [facility])    Phreesia 04/18/2020   Past Surgical History:  Procedure Laterality Date   basal cell carcinoma of anterior chest  2000   resection; [place]   cataract surgery  2010   with lens repacement Left   COLONOSCOPY  09/23/12   normal.  Symptoms: anemia, hemoccult+.  [facility].   ESOPHAGOGASTRODUODENOSCOPY  09/23/12   normal.  Symptoms: anemia, hemoccult +, GERD.   EYE SURGERY Bilateral    cataract   FRACTURE SURGERY Left    1957 left arm   small bowel mass  1991   removed part of small intestine benign mass   SMALL INTESTINE SURGERY      Family History  Problem Relation Age of Onset   Hyperlipidemia Sister    Mental illness Sister        anxiety   Liver disease Sister    Hyperlipidemia Brother    Diabetes Brother    Arthritis Brother        Knee replacement   Bipolar disorder Brother    Liver disease Brother    Heart disease Brother 54       AMI/CAD with stenting   Diabetes Mother    Depression Mother    Esophageal varices Mother    Hyperlipidemia Mother    Mental illness Mother    Liver disease Mother    Cancer Father        unknown   Social History   Socioeconomic History   Marital status: Married    Spouse name: [name]   Number of children: 1   Years of education: Not on file   Highest education level: Not on file  Occupational History   Occupation: Disability    Comment: CVA   Occupation: Previous Network engineer of [employer]  Tobacco Use   Smoking status: Former    Packs/day: 2.00    Years: 25.00    Additional pack years: 0.00    Total pack years: 50.00    Types: Cigarettes   Smokeless tobacco: Never   Tobacco comments:    quit in 1984  Vaping Use   Vaping Use: Never used  Substance and Sexual Activity   Alcohol use: Yes    Alcohol/week: 6.0 standard drinks of alcohol    Types: 6 Cans of beer per week    Comment: 6 drinks/week   Drug use: No   Sexual activity: Yes  Other Topics Concern   Not on file  Social History Narrative   Marital status:  Married x 43 years      Children: 1 daughte (45)r; 1 grandson (17)      Lives: with wife; has mountain house and [place] house at [employer]      Employment: disabled since CVA      Tobacco: none      Alcohol:  1-2 drinks per night      Drugs:  None      Exercise: very active in the yard. No formal exercise program in 2019.      ADLs: independent with ADLs; drives locally; maintains own yard.        Advanced Directives:  NONE in 2019.  FULL CODE; no prolonged measures.         Social Determinants of Health   Financial Resource Strain: Low  Risk  (08/27/2022)   Overall Financial Resource Strain (CARDIA)    Difficulty of Paying Living Expenses: Not hard at all  Food Insecurity: No Food Insecurity (08/27/2022)  Hunger Vital Sign    Worried About Running Out of Food in the Last Year: Never true    Ran Out of Food in the Last Year: Never true  Transportation Needs: No Transportation Needs (08/27/2022)   PRAPARE - Administrator, Civil Service (Medical): No    Lack of Transportation (Non-Medical): No  Physical Activity: Sufficiently Active (08/27/2022)   Exercise Vital Sign    Days of Exercise per Week: 7 days    Minutes of Exercise per Session: 60 min  Stress: Stress Concern Present (08/27/2022)   [employer] of Occupational Health - Occupational Stress Questionnaire    Feeling of Stress : To some extent  Social Connections: Unknown (08/27/2022)   Social Connection and Isolation Panel [NHANES]    Frequency of Communication with Friends and Family: More than three times a week    Frequency of Social Gatherings with Friends and Family: Twice a week    Attends Religious Services: Patient unable to answer    Active Member of Clubs or Organizations: No    Attends Banker Meetings: Never    Marital Status: Married    Tobacco Counseling Counseling given: Not Answered Tobacco comments: quit in 1984   Clinical Intake:  Pre-visit preparation completed: Yes  Pain : 0-10 Pain Score: 5  Pain Type: Chronic pain Pain Location: Mouth     BMI - recorded: 26.72 Nutritional Status: BMI 25 -29 Overweight Nutritional Risks: None Diabetes: No  How often do you need to have someone help you when you read instructions, pamphlets, or other written materials from your doctor or pharmacy?: 2 - Rarely What is the last grade level you completed in school?: College Degree  Interpreter Needed?: No  Information entered by :: Susie Cassette, LPN.   Activities of Daily Living    08/27/2022    9:43 AM  08/26/2022   11:59 AM  In your present state of health, do you have any difficulty performing the following activities:  Hearing? 1 1  Vision? 1 1  Difficulty concentrating or making decisions? 0 0  Walking or climbing stairs? 0 0  Dressing or bathing? 0 0  Doing errands, shopping? 1 1  Preparing Food and eating ? N N  Using the Toilet? N N  In the past six months, have you accidently leaked urine? N N  Do you have problems with loss of bowel control? N N  Managing your Medications? N N  Managing your Finances? N N  Housekeeping or managing your Housekeeping? N N    Patient Care Team: Georgina Quint, MD as PCP - General (Internal Medicine) Antonieta Iba, MD as Consulting Physician (Cardiology) Dasher, Cliffton Asters, MD (Dermatology) Lonell Face, MD as Consulting Physician (Neurology) Drema Dallas, DO as Consulting Physician (Neurology) Domingo Madeira, OD as Consulting Physician (Optometry)  Indicate any recent Medical Services you may have received from other than [facility] providers in the past year (date may be approximate).     Assessment:   This is a routine wellness examination for [place].  Hearing/Vision screen Hearing Screening - Comments:: Patient has tinnitus; no hearing aids. Vision Screening - Comments:: Wears no eye glasses - up to date with routine eye exams with [facility], [facility].   Dietary issues and exercise activities discussed:     Goals Addressed             This Visit's Progress    My goal for 2024 is to lose 7-10 pounds.  Depression Screen    08/27/2022    9:52 AM 04/01/2022    8:25 AM 01/07/2022   10:33 AM 03/27/2021   11:37 AM 12/04/2020   11:37 AM 08/16/2020    8:02 AM 04/27/2020    3:53 PM  PHQ 2/9 Scores  PHQ - 2 Score 0 0 0 0 0 0 0  PHQ- 9 Score 0          Fall Risk    08/27/2022    9:43 AM 08/26/2022   11:59 AM 08/09/2022   11:04 AM 08/06/2022    9:38 AM 04/01/2022    8:25 AM  Fall Risk   Falls in the past year? 0 0 0 0 0   Number falls in past yr: 0 0 0 0 0  Injury with Fall? 0 0 0 0 0  Risk for fall due to : No Fall Risks    No Fall Risks  Follow up Falls prevention discussed    Falls evaluation completed    MEDICARE RISK AT HOME:  Medicare Risk at Home - 08/27/22 0944     Any stairs in or around the home? Yes    If so, are there any without handrails? No    Home free of loose throw rugs in walkways, pet beds, electrical cords, etc? Yes    Adequate lighting in your home to reduce risk of falls? Yes    Life alert? No    Use of a cane, walker or w/c? No    Grab bars in the bathroom? Yes    Shower chair or bench in shower? Yes    Elevated toilet seat or a handicapped toilet? Yes             TIMED UP AND GO:  Was the test performed?  No    Cognitive Function:        08/27/2022    9:44 AM 01/07/2022   11:05 AM 08/11/2019    8:06 AM 12/17/2018   11:20 AM  6CIT Screen  What Year? 0 points 0 points 0 points 0 points  What month? 0 points 0 points 0 points 0 points  What time? 0 points 0 points 0 points 0 points  Count back from 20 0 points 0 points 0 points 0 points  Months in reverse 0 points 0 points 0 points 0 points  Repeat phrase 0 points 0 points 0 points 0 points  Total Score 0 points 0 points 0 points 0 points    Immunizations Immunization History  Administered Date(s) Administered   Fluad Quad(high Dose 65+) 12/08/2018, 01/07/2022   Influenza Split 01/02/2007, 02/06/2009, 04/03/2011, 12/16/2011   Influenza, High Dose Seasonal PF 01/22/2018, 12/11/2019, 12/13/2019   Influenza,inj,Quad PF,6+ Mos 01/03/2014, 01/09/2015, 01/16/2016, 01/20/2017   Influenza-Unspecified 12/04/2012, 12/15/2020   PFIZER Comirnaty(Gray Top)Covid-19 Tri-Sucrose Vaccine 04/20/2019, 05/11/2019, 12/24/2019, 06/16/2020, 12/15/2020   Pneumococcal Conjugate-13 08/01/2014   Pneumococcal Polysaccharide-23 07/16/2016   Td 01/07/2022   Tdap 04/30/2011   Zoster Recombinat (Shingrix) 08/16/2020, 03/27/2021    Zoster, Live 12/29/2014    TDAP status: Up to date  Flu Vaccine status: Up to date  Pneumococcal vaccine status: Up to date  Covid-19 vaccine status: Completed vaccines  Qualifies for Shingles Vaccine? Yes   Zostavax completed Yes   Shingrix Completed?: Yes  Screening Tests Health Maintenance  Topic Date Due   Lung Cancer Screening  Never done   Colonoscopy  09/25/2022   INFLUENZA VACCINE  10/10/2022   Medicare Annual Wellness (AWV)  08/27/2023  DTaP/Tdap/Td (3 - Td or Tdap) 01/08/2032   Pneumonia Vaccine 7+ Years old  Completed   Hepatitis C Screening  Completed   Zoster Vaccines- Shingrix  Completed   HPV VACCINES  Aged Out   COVID-19 Vaccine  Discontinued    Health Maintenance  Health Maintenance Due  Topic Date Due   Lung Cancer Screening  Never done   Colonoscopy  09/25/2022    Colorectal cancer screening: Type of screening: Colonoscopy. Completed 09/24/2012. Repeat every 10 years  Lung Cancer Screening: (Low Dose CT Chest recommended if Age 42-80 years, 20 pack-year currently smoking OR have quit w/in 15years.) does qualify.   Lung Cancer Screening Referral: no; will speak with pcp at next visit  Additional Screening:  Hepatitis C Screening: does qualify; Completed 01/09/2015  Vision Screening: Recommended annual ophthalmology exams for early detection of glaucoma and other disorders of the eye. Is the patient up to date with their annual eye exam?  Yes  Who is the provider or what is the name of the office in which the patient attends annual eye exams? [facility], OD. If pt is not established with a provider, would they like to be referred to a provider to establish care? No .   Dental Screening: Recommended annual dental exams for proper oral hygiene   Community Resource Referral / Chronic Care Management: CRR required this visit?  No   CCM required this visit?  No     Plan:     I have personally reviewed and noted the following in the  patient's chart:   Medical and social history Use of alcohol, tobacco or illicit drugs  Current medications and supplements including opioid prescriptions. Patient is not currently taking opioid prescriptions. Functional ability and status Nutritional status Physical activity Advanced directives List of other physicians Hospitalizations, surgeries, and ER visits in previous 12 months Vitals Screenings to include cognitive, depression, and falls Referrals and appointments  In addition, I have reviewed and discussed with patient certain preventive protocols, quality metrics, and best practice recommendations. A written personalized care plan for preventive services as well as general preventive health recommendations were provided to patient.     Mickeal Needy, LPN   11/07/5619   After Visit Summary: (Mail) Due to this being a telephonic visit, the after visit summary with patients personalized plan was offered to patient via mail   Nurse Notes: Normal cognitive status assessed by direct observation via telephone conversation by this Nurse Health Advisor. No abnormalities found.

## 2022-08-27 NOTE — Patient Instructions (Addendum)
Tyrone Curtis , Thank you for taking time to come for your Medicare Wellness Visit. I appreciate your ongoing commitment to your health goals. Please review the following plan we discussed and let me know if I can assist you in the future.   These are the goals we discussed:  Goals      My goal for 2024 is to lose 7-10 pounds.        This is a list of the screening recommended for you and due dates:  Health Maintenance  Topic Date Due   Screening for Lung Cancer  Never done   Colon Cancer Screening  09/25/2022   Flu Shot  10/10/2022   Medicare Annual Wellness Visit  08/27/2023   DTaP/Tdap/Td vaccine (3 - Td or Tdap) 01/08/2032   Pneumonia Vaccine  Completed   Hepatitis C Screening  Completed   Zoster (Shingles) Vaccine  Completed   HPV Vaccine  Aged Out   COVID-19 Vaccine  Discontinued    Advanced directives: Yes  Conditions/risks identified: Yes  Next appointment: 09/02/2023 at 10:30 a.m. telephone visit with Percell Miller, Nurse Health Advisor.  If you need to reschedule or cancel, please call [phone].  Preventive Care 35 Years and Older, Male  Preventive care refers to lifestyle choices and visits with your health care provider that can promote health and wellness. What does preventive care include? A yearly physical exam. This is also called an annual well check. Dental exams once or twice a year. Routine eye exams. Ask your health care provider how often you should have your eyes checked. Personal lifestyle choices, including: Daily care of your teeth and gums. Regular physical activity. Eating a healthy diet. Avoiding tobacco and drug use. Limiting alcohol use. Practicing safe sex. Taking low doses of aspirin every day. Taking vitamin and mineral supplements as recommended by your health care provider. What happens during an annual well check? The services and screenings done by your health care provider during your annual well check will depend on your age, overall  health, lifestyle risk factors, and family history of disease. Counseling  Your health care provider may ask you questions about your: Alcohol use. Tobacco use. Drug use. Emotional well-being. Home and relationship well-being. Sexual activity. Eating habits. History of falls. Memory and ability to understand (cognition). Work and work Astronomer. Screening  You may have the following tests or measurements: Height, weight, and BMI. Blood pressure. Lipid and cholesterol levels. These may be checked every 5 years, or more frequently if you are over 50 years old. Skin check. Lung cancer screening. You may have this screening every year starting at age 54 if you have a 30-pack-year history of smoking and currently smoke or have quit within the past 15 years. Fecal occult blood test (FOBT) of the stool. You may have this test every year starting at age 65. Flexible sigmoidoscopy or colonoscopy. You may have a sigmoidoscopy every 5 years or a colonoscopy every 10 years starting at age 24. Prostate cancer screening. Recommendations will vary depending on your family history and other risks. Hepatitis C blood test. Hepatitis B blood test. Sexually transmitted disease (STD) testing. Diabetes screening. This is done by checking your blood sugar (glucose) after you have not eaten for a while (fasting). You may have this done every 1-3 years. Abdominal aortic aneurysm (AAA) screening. You may need this if you are a current or former smoker. Osteoporosis. You may be screened starting at age 10 if you are at high risk. Talk with your  health care provider about your test results, treatment options, and if necessary, the need for more tests. Vaccines  Your health care provider may recommend certain vaccines, such as: Influenza vaccine. This is recommended every year. Tetanus, diphtheria, and acellular pertussis (Tdap, Td) vaccine. You may need a Td booster every 10 years. Zoster vaccine. You may  need this after age 63. Pneumococcal 13-valent conjugate (PCV13) vaccine. One dose is recommended after age 7. Pneumococcal polysaccharide (PPSV23) vaccine. One dose is recommended after age 31. Talk to your health care provider about which screenings and vaccines you need and how often you need them. This information is not intended to replace advice given to you by your health care provider. Make sure you discuss any questions you have with your health care provider. Document Released: 03/24/2015 Document Revised: 11/15/2015 Document Reviewed: 12/27/2014 Elsevier Interactive Patient Education  2017 [place] Prevention in the Home Falls can cause injuries. They can happen to people of all ages. There are many things you can do to make your home safe and to help prevent falls. What can I do on the outside of my home? Regularly fix the edges of walkways and driveways and fix any cracks. Remove anything that might make you trip as you walk through a door, such as a raised step or threshold. Trim any bushes or trees on the path to your home. Use bright outdoor lighting. Clear any walking paths of anything that might make someone trip, such as rocks or tools. Regularly check to see if handrails are loose or broken. Make sure that both sides of any steps have handrails. Any raised decks and porches should have guardrails on the edges. Have any leaves, snow, or ice cleared regularly. Use sand or salt on walking paths during winter. Clean up any spills in your garage right away. This includes oil or grease spills. What can I do in the bathroom? Use night lights. Install grab bars by the toilet and in the tub and shower. Do not use towel bars as grab bars. Use non-skid mats or decals in the tub or shower. If you need to sit down in the shower, use a plastic, non-slip stool. Keep the floor dry. Clean up any water that spills on the floor as soon as it happens. Remove soap buildup in  the tub or shower regularly. Attach bath mats securely with double-sided non-slip rug tape. Do not have throw rugs and other things on the floor that can make you trip. What can I do in the bedroom? Use night lights. Make sure that you have a light by your bed that is easy to reach. Do not use any sheets or blankets that are too big for your bed. They should not hang down onto the floor. Have a firm chair that has side arms. You can use this for support while you get dressed. Do not have throw rugs and other things on the floor that can make you trip. What can I do in the kitchen? Clean up any spills right away. Avoid walking on wet floors. Keep items that you use a lot in easy-to-reach places. If you need to reach something above you, use a strong step stool that has a grab bar. Keep electrical cords out of the way. Do not use floor polish or wax that makes floors slippery. If you must use wax, use non-skid floor wax. Do not have throw rugs and other things on the floor that can make you trip. What  can I do with my stairs? Do not leave any items on the stairs. Make sure that there are handrails on both sides of the stairs and use them. Fix handrails that are broken or loose. Make sure that handrails are as long as the stairways. Check any carpeting to make sure that it is firmly attached to the stairs. Fix any carpet that is loose or worn. Avoid having throw rugs at the top or bottom of the stairs. If you do have throw rugs, attach them to the floor with carpet tape. Make sure that you have a light switch at the top of the stairs and the bottom of the stairs. If you do not have them, ask someone to add them for you. What else can I do to help prevent falls? Wear shoes that: Do not have high heels. Have rubber bottoms. Are comfortable and fit you well. Are closed at the toe. Do not wear sandals. If you use a stepladder: Make sure that it is fully opened. Do not climb a closed  stepladder. Make sure that both sides of the stepladder are locked into place. Ask someone to hold it for you, if possible. Clearly mark and make sure that you can see: Any grab bars or handrails. First and last steps. Where the edge of each step is. Use tools that help you move around (mobility aids) if they are needed. These include: Canes. Walkers. Scooters. Crutches. Turn on the lights when you go into a dark area. Replace any light bulbs as soon as they burn out. Set up your furniture so you have a clear path. Avoid moving your furniture around. If any of your floors are uneven, fix them. If there are any pets around you, be aware of where they are. Review your medicines with your doctor. Some medicines can make you feel dizzy. This can increase your chance of falling. Ask your doctor what other things that you can do to help prevent falls. This information is not intended to replace advice given to you by your health care provider. Make sure you discuss any questions you have with your health care provider. Document Released: 12/22/2008 Document Revised: 08/03/2015 Document Reviewed: 04/01/2014 Elsevier Interactive Patient Education  2017 [employer].

## 2022-10-02 ENCOUNTER — Encounter: Payer: Self-pay | Admitting: Emergency Medicine

## 2022-10-02 ENCOUNTER — Ambulatory Visit (INDEPENDENT_AMBULATORY_CARE_PROVIDER_SITE_OTHER): Payer: PPO | Admitting: Emergency Medicine

## 2022-10-02 VITALS — BP 134/88 | HR 70 | Temp 97.9°F | Ht 72.0 in | Wt 201.5 lb

## 2022-10-02 DIAGNOSIS — Z0001 Encounter for general adult medical examination with abnormal findings: Secondary | ICD-10-CM

## 2022-10-02 DIAGNOSIS — Z130 Encounter for screening for diseases of the blood and blood-forming organs and certain disorders involving the immune mechanism: Secondary | ICD-10-CM

## 2022-10-02 DIAGNOSIS — E782 Mixed hyperlipidemia: Secondary | ICD-10-CM

## 2022-10-02 DIAGNOSIS — Z13228 Encounter for screening for other metabolic disorders: Secondary | ICD-10-CM

## 2022-10-02 DIAGNOSIS — Z87891 Personal history of nicotine dependence: Secondary | ICD-10-CM

## 2022-10-02 DIAGNOSIS — Z125 Encounter for screening for malignant neoplasm of prostate: Secondary | ICD-10-CM | POA: Diagnosis not present

## 2022-10-02 DIAGNOSIS — Z1329 Encounter for screening for other suspected endocrine disorder: Secondary | ICD-10-CM

## 2022-10-02 DIAGNOSIS — Z8673 Personal history of transient ischemic attack (TIA), and cerebral infarction without residual deficits: Secondary | ICD-10-CM

## 2022-10-02 DIAGNOSIS — K219 Gastro-esophageal reflux disease without esophagitis: Secondary | ICD-10-CM | POA: Diagnosis not present

## 2022-10-02 DIAGNOSIS — J069 Acute upper respiratory infection, unspecified: Secondary | ICD-10-CM | POA: Diagnosis not present

## 2022-10-02 DIAGNOSIS — E785 Hyperlipidemia, unspecified: Secondary | ICD-10-CM

## 2022-10-02 DIAGNOSIS — Z13220 Encounter for screening for lipoid disorders: Secondary | ICD-10-CM

## 2022-10-02 DIAGNOSIS — Z1211 Encounter for screening for malignant neoplasm of colon: Secondary | ICD-10-CM

## 2022-10-02 DIAGNOSIS — R7303 Prediabetes: Secondary | ICD-10-CM | POA: Diagnosis not present

## 2022-10-02 LAB — PSA: PSA: 1.72 ng/mL (ref 0.10–4.00)

## 2022-10-02 LAB — COMPREHENSIVE METABOLIC PANEL
ALT: 15 U/L (ref 0–53)
AST: 22 U/L (ref 0–37)
Albumin: 4 g/dL (ref 3.5–5.2)
Alkaline Phosphatase: 85 U/L (ref 39–117)
BUN: 10 mg/dL (ref 6–23)
CO2: 26 mEq/L (ref 19–32)
Calcium: 9.5 mg/dL (ref 8.4–10.5)
Chloride: 105 mEq/L (ref 96–112)
Creatinine, Ser: 0.89 mg/dL (ref 0.40–1.50)
GFR: 84.62 mL/min (ref >60.00)
Glucose, Bld: 102 mg/dL — ABNORMAL HIGH (ref 70–99)
Potassium: 4.9 mEq/L (ref 3.5–5.1)
Sodium: 141 mEq/L (ref 135–145)
Total Bilirubin: 0.5 mg/dL (ref 0.2–1.2)
Total Protein: 7.1 g/dL (ref 6.0–8.3)

## 2022-10-02 LAB — CBC WITH DIFFERENTIAL/PLATELET
Basophils Absolute: 0.1 10*3/uL (ref 0.0–0.1)
Basophils Relative: 0.7 % (ref 0.0–3.0)
Eosinophils Absolute: 0.3 10*3/uL (ref 0.0–0.7)
Eosinophils Relative: 4 % (ref 0.0–5.0)
HCT: 41.8 % (ref 39.0–52.0)
Hemoglobin: 13.6 g/dL (ref 13.0–17.0)
Lymphocytes Relative: 23.3 % (ref 12.0–46.0)
Lymphs Abs: 1.9 10*3/uL (ref 0.7–4.0)
MCHC: 32.4 g/dL (ref 30.0–36.0)
MCV: 95.4 fl (ref 78.0–100.0)
Monocytes Absolute: 0.7 10*3/uL (ref 0.1–1.0)
Monocytes Relative: 8.9 % (ref 3.0–12.0)
Neutro Abs: 5.2 10*3/uL (ref 1.4–7.7)
Neutrophils Relative %: 63.1 % (ref 43.0–77.0)
Platelets: 252 10*3/uL (ref 150.0–400.0)
RBC: 4.38 Mil/uL (ref 4.22–5.81)
RDW: 13.4 % (ref 11.5–15.5)
WBC: 8.2 10*3/uL (ref 4.0–10.5)

## 2022-10-02 LAB — LIPID PANEL
Cholesterol: 136 mg/dL (ref 0–200)
HDL: 34.7 mg/dL — ABNORMAL LOW (ref >39.00)
LDL Cholesterol: 75 mg/dL (ref 0–99)
NonHDL: 101.32
Total CHOL/HDL Ratio: 4
Triglycerides: 130 mg/dL (ref 0.0–149.0)
VLDL: 26 mg/dL (ref 0.0–40.0)

## 2022-10-02 LAB — HEMOGLOBIN A1C: Hgb A1c MFr Bld: 6 % (ref 4.6–6.5)

## 2022-10-02 MED ORDER — ATORVASTATIN CALCIUM 80 MG PO TABS: ORAL_TABLET | ORAL | 3 refills | Status: DC

## 2022-10-02 NOTE — Assessment & Plan Note (Signed)
Diet and nutrition discussed Cardiovascular risk associated with diabetes discussed Hemoglobin A1c done today

## 2022-10-02 NOTE — Progress Notes (Signed)
Bowen L Osmond 72 y.o.   Chief Complaint  Patient presents with   Annual Exam    Patient states he has been sick with a cold, cough is lingering, coughing up a lot of mucus.      Headache    Have issues with cluster headaches     HISTORY OF PRESENT ILLNESS: This is a 72 y.o. male here for annual exam and follow-up of multiple chronic medical conditions Has been fighting a cold for the last 2 weeks.  Slowly getting better. Has occasional cluster headaches No other complaints or medical concerns today.  Headache  Associated symptoms include coughing. Pertinent negatives include no abdominal pain, fever, nausea or vomiting.     Prior to Admission medications   Medication Sig Start Date End Date Taking? Authorizing Provider  Acetaminophen (TYLENOL PO) Take by mouth as needed.   Yes [provider]  ASPIRIN 81 PO Take 162 mg by mouth daily.   Yes [provider]  loratadine (CLARITIN) 10 MG tablet Take 10 mg by mouth daily.   Yes [provider]  Multiple Vitamin (MULTIVITAMINS PO) Take by mouth daily.   Yes [provider]  omeprazole (PRILOSEC) 40 MG capsule Take 1 capsule by mouth daily 05/06/22  Yes Jarmarcus Wambold, Eilleen Kempf, MD  traMADol (ULTRAM) 50 MG tablet Take 50 mg by mouth 3 (three) times daily as needed. 03/01/21  Yes [provider]  atorvastatin (LIPITOR) 80 MG tablet TAKE 1 TABLET BY MOUTH DAILY 10/02/22   Georgina Quint, MD    Allergies  Allergen Reactions   Zyrtec D [Cetirizine-Pseudoephedrine Er]     "talking out of his head"   Other     Patient Active Problem List   Diagnosis Date Noted   Trigger ring finger of right hand 10/01/2021   Chronic right shoulder pain 10/01/2021   History of stroke 03/27/2021   Prediabetes 01/02/2018   Overweight (BMI 25.0-29.9) 01/02/2018   History of basal cell carcinoma 08/12/2017   Paroxysmal tachycardia (HCC) 08/23/2016   Smoking history 08/23/2016   Adjustment  disorder with mixed anxiety and depressed mood 07/16/2016   Dyslipidemia 01/06/2014   Gastroesophageal reflux disease without esophagitis 01/06/2014   Hemiparesis affecting left side as late effect of cerebrovascular accident (CVA) (HCC) 01/06/2014   Insomnia 07/26/2013   Erectile dysfunction 12/16/2011    Past Medical History:  Diagnosis Date   Allergic rhinitis, cause unspecified    Allergy    Anxiety state, unspecified    Arthritis    Basal cell carcinoma    facial   Cataract    Chicken pox    CVA (cerebral vascular accident) (HCC)    Depression    Diaphragmatic hernia without mention of obstruction or gangrene    Dysphagia, unspecified(787.20)    Erectile dysfunction    Herpes zoster without mention of complication    Hyperlipidemia    Phreesia 04/18/2020   Internal hemorrhoids without mention of complication    Measles    Mumps    x 2   Personal history of colonic polyps    Pure hypercholesterolemia    Regurgitation    Stroke (HCC)    Phreesia 04/18/2020    Past Surgical History:  Procedure Laterality Date   basal cell carcinoma of anterior chest  2000   resection; [facility]   cataract surgery  2010   with lens repacement Left   COLONOSCOPY  09/23/12   normal.  Symptoms: anemia, hemoccult+.  [facility].   ESOPHAGOGASTRODUODENOSCOPY  09/23/12   normal.  Symptoms: anemia, hemoccult +, GERD.   EYE SURGERY Bilateral    cataract   FRACTURE SURGERY Left    1957 left arm   small bowel mass  1991   removed part of small intestine benign mass   SMALL INTESTINE SURGERY      Social History   Socioeconomic History   Marital status: Married    Spouse name: [name]   Number of children: 1   Years of education: Not on file   Highest education level: Not on file  Occupational History   Occupation: Disability    Comment: CVA   Occupation: Previous Network engineer of [employer]  Tobacco Use   Smoking status: Former    Current packs/day: 2.00    Average packs/day: 2.0  packs/day for 25.0 years (50.0 ttl pk-yrs)    Types: Cigarettes   Smokeless tobacco: Never   Tobacco comments:    quit in 1984  Vaping Use   Vaping status: Never Used  Substance and Sexual Activity   Alcohol use: Yes    Alcohol/week: 6.0 standard drinks of alcohol    Types: 6 Cans of beer per week    Comment: 6 drinks/week   Drug use: No   Sexual activity: Yes  Other Topics Concern   Not on file  Social History Narrative   Marital status:  Married x 43 years      Children: 1 daughte (45)r; 1 grandson (17)      Lives: with wife; has mountain house and [place] house at [employer]      Employment: disabled since CVA      Tobacco: none      Alcohol:  1-2 drinks per night      Drugs:  None      Exercise: very active in the yard. No formal exercise program in 2019.      ADLs: independent with ADLs; drives locally; maintains own yard.        Advanced Directives:  NONE in 2019.  FULL CODE; no prolonged measures.         Social Determinants of Health   Financial Resource Strain: Low Risk  (08/27/2022)   Overall Financial Resource Strain (CARDIA)    Difficulty of Paying Living Expenses: Not hard at all  Food Insecurity: No Food Insecurity (08/27/2022)   Hunger Vital Sign    Worried About Running Out of Food in the Last Year: Never true    Ran Out of Food in the Last Year: Never true  Transportation Needs: No Transportation Needs (08/27/2022)   PRAPARE - Administrator, Civil Service (Medical): No    Lack of Transportation (Non-Medical): No  Physical Activity: Sufficiently Active (08/27/2022)   Exercise Vital Sign    Days of Exercise per Week: 7 days    Minutes of Exercise per Session: 60 min  Stress: Stress Concern Present (08/27/2022)   [employer] of Occupational Health - Occupational Stress Questionnaire    Feeling of Stress : To some extent  Social Connections: Unknown (08/27/2022)   Social Connection and Isolation Panel [NHANES]    Frequency of Communication  with Friends and Family: More than three times a week    Frequency of Social Gatherings with Friends and Family: Twice a week    Attends Religious Services: Patient unable to answer    Active Member of Clubs or Organizations: No    Attends Banker Meetings: Never    Marital Status: Married  Catering manager  Violence: Not At Risk (08/27/2022)   Humiliation, Afraid, Rape, and Kick questionnaire    Fear of Current or Ex-Partner: No    Emotionally Abused: No    Physically Abused: No    Sexually Abused: No    Family History  Problem Relation Age of Onset   Hyperlipidemia Sister    Mental illness Sister        anxiety   Liver disease Sister    Hyperlipidemia Brother    Diabetes Brother    Arthritis Brother        Knee replacement   Bipolar disorder Brother    Liver disease Brother    Heart disease Brother 44       AMI/CAD with stenting   Diabetes Mother    Depression Mother    Esophageal varices Mother    Hyperlipidemia Mother    Mental illness Mother    Liver disease Mother    Cancer Father        unknown     Review of Systems  Constitutional: Negative.  Negative for chills and fever.  HENT:  Positive for congestion.   Respiratory:  Positive for cough. Negative for shortness of breath.   Cardiovascular: Negative.  Negative for chest pain and palpitations.  Gastrointestinal:  Negative for abdominal pain, nausea and vomiting.  Genitourinary: Negative.  Negative for dysuria.  Skin: Negative.  Negative for rash.  Neurological:  Positive for headaches.  All other systems reviewed and are negative.   Vitals:   10/02/22 0833  BP: 134/88  Pulse: 70  Temp: 97.9 F (36.6 C)  SpO2: 97%    Physical Exam Vitals reviewed.  Constitutional:      Appearance: Normal appearance.  HENT:     Head: Normocephalic.     Right Ear: Tympanic membrane, ear canal and external ear normal.     Left Ear: Tympanic membrane, ear canal and external ear normal.      Mouth/Throat:     Mouth: Mucous membranes are moist.     Pharynx: Oropharynx is clear.  Eyes:     Extraocular Movements: Extraocular movements intact.     Conjunctiva/sclera: Conjunctivae normal.     Pupils: Pupils are equal, round, and reactive to light.  Cardiovascular:     Rate and Rhythm: Normal rate and regular rhythm.     Pulses: Normal pulses.     Heart sounds: Normal heart sounds.  Pulmonary:     Effort: Pulmonary effort is normal.     Breath sounds: Normal breath sounds.  Abdominal:     Palpations: Abdomen is soft.     Tenderness: There is no abdominal tenderness.  Musculoskeletal:     Cervical back: No tenderness.     Right lower leg: No edema.     Left lower leg: No edema.  Lymphadenopathy:     Cervical: No cervical adenopathy.  Skin:    General: Skin is warm and dry.     Capillary Refill: Capillary refill takes less than 2 seconds.  Neurological:     General: No focal deficit present.     Mental Status: He is alert and oriented to person, place, and time.  Psychiatric:        Mood and Affect: Mood normal.        Behavior: Behavior normal.      ASSESSMENT & PLAN: Problem List Items Addressed This Visit       Respiratory   Acute URI    Running its course without complications May use  over-the-counter Mucinex DM as needed Advised to rest and stay well-hydrated        Digestive   Gastroesophageal reflux disease without esophagitis    Stable and well-controlled. Continues omeprazole 40 mg daily        Other   Dyslipidemia    Eating better and staying physically active Diet and nutrition discussed Continue atorvastatin 80 mg daily Lipid profile done today      Relevant Medications   atorvastatin (LIPITOR) 80 MG tablet   Other Relevant Orders   CBC with Differential/Platelet   Comprehensive metabolic panel   Hemoglobin A1c   Lipid panel   Smoking history    Recommend lung cancer screening      Prediabetes    Diet and nutrition  discussed Cardiovascular risk associated with diabetes discussed Hemoglobin A1c done today      Relevant Orders   Hemoglobin A1c   History of stroke    Secondary stroke prevention measures discussed. BP Readings from Last 3 Encounters:  10/02/22 134/88  04/01/22 126/80  01/07/22 120/68         Other Visit Diagnoses     Encounter for general adult medical examination with abnormal findings    -  Primary   Mixed hyperlipidemia       Relevant Medications   atorvastatin (LIPITOR) 80 MG tablet   History of smoking       Relevant Orders   Ambulatory Referral for Lung Cancer Screening [REF832]   Screening for colon cancer       Relevant Orders   Ambulatory referral to Gastroenterology   Screening for prostate cancer       Relevant Orders   PSA   Colon cancer screening       Relevant Orders   Ambulatory referral to Gastroenterology   Screening for deficiency anemia       Relevant Orders   CBC with Differential/Platelet   Screening for lipoid disorders       Relevant Orders   Lipid panel   Screening for endocrine, metabolic and immunity disorder       Relevant Orders   Comprehensive metabolic panel   Hemoglobin A1c        Modifiable risk factors discussed with patient. Anticipatory guidance according to age provided. The following topics were also discussed: Social Determinants of Health Smoking.  Ex-smoker.  Needs lung cancer screening Diet and nutrition and need to decrease amount of daily carbohydrate intake and daily calories and increase amount of plant-based protein in his diet Benefits of exercise Cancer screening and need for colon cancer screening with colonoscopy Vaccinations review and recommendations Cardiovascular risk assessment Review of multiple chronic medical conditions and their management Review of all medications Mental health including depression and anxiety Fall and accident prevention  Patient Instructions  Health Maintenance After Age  45 After age 28, you are at a higher risk for certain long-term diseases and infections as well as injuries from falls. Falls are a major cause of broken bones and head injuries in people who are older than age 29. Getting regular preventive care can help to keep you healthy and well. Preventive care includes getting regular testing and making lifestyle changes as recommended by your health care provider. Talk with your health care provider about: Which screenings and tests you should have. A screening is a test that checks for a disease when you have no symptoms. A diet and exercise plan that is right for you. What should I know about screenings and  tests to prevent falls? Screening and testing are the best ways to find a health problem early. Early diagnosis and treatment give you the best chance of managing medical conditions that are common after age 11. Certain conditions and lifestyle choices may make you more likely to have a fall. Your health care provider may recommend: Regular vision checks. Poor vision and conditions such as cataracts can make you more likely to have a fall. If you wear glasses, make sure to get your prescription updated if your vision changes. Medicine review. Work with your health care provider to regularly review all of the medicines you are taking, including over-the-counter medicines. Ask your health care provider about any side effects that may make you more likely to have a fall. Tell your health care provider if any medicines that you take make you feel dizzy or sleepy. Strength and balance checks. Your health care provider may recommend certain tests to check your strength and balance while standing, walking, or changing positions. Foot health exam. Foot pain and numbness, as well as not wearing proper footwear, can make you more likely to have a fall. Screenings, including: Osteoporosis screening. Osteoporosis is a condition that causes the bones to get weaker and  break more easily. Blood pressure screening. Blood pressure changes and medicines to control blood pressure can make you feel dizzy. Depression screening. You may be more likely to have a fall if you have a fear of falling, feel depressed, or feel unable to do activities that you used to do. Alcohol use screening. Using too much alcohol can affect your balance and may make you more likely to have a fall. Follow these instructions at home: Lifestyle Do not drink alcohol if: Your health care provider tells you not to drink. If you drink alcohol: Limit how much you have to: 0-1 drink a day for women. 0-2 drinks a day for men. Know how much alcohol is in your drink. In the [place] one drink equals one 12 oz bottle of beer (355 mL), one 5 oz glass of wine (148 mL), or one 1 oz glass of hard liquor (44 mL). Do not use any products that contain nicotine or tobacco. These products include cigarettes, chewing tobacco, and vaping devices, such as e-cigarettes. If you need help quitting, ask your health care provider. Activity  Follow a regular exercise program to stay fit. This will help you maintain your balance. Ask your health care provider what types of exercise are appropriate for you. If you need a cane or walker, use it as recommended by your health care provider. Wear supportive shoes that have nonskid soles. Safety  Remove any tripping hazards, such as rugs, cords, and clutter. Install safety equipment such as grab bars in bathrooms and safety rails on stairs. Keep rooms and walkways well-lit. General instructions Talk with your health care provider about your risks for falling. Tell your health care provider if: You fall. Be sure to tell your health care provider about all falls, even ones that seem minor. You feel dizzy, tiredness (fatigue), or off-balance. Take over-the-counter and prescription medicines only as told by your health care provider. These include supplements. Eat a healthy  diet and maintain a healthy weight. A healthy diet includes low-fat dairy products, low-fat (lean) meats, and fiber from whole grains, beans, and lots of fruits and vegetables. Stay current with your vaccines. Schedule regular health, dental, and eye exams. Summary Having a healthy lifestyle and getting preventive care can help to protect your health  and wellness after age 2. Screening and testing are the best way to find a health problem early and help you avoid having a fall. Early diagnosis and treatment give you the best chance for managing medical conditions that are more common for people who are older than age 29. Falls are a major cause of broken bones and head injuries in people who are older than age 9. Take precautions to prevent a fall at home. Work with your health care provider to learn what changes you can make to improve your health and wellness and to prevent falls. This information is not intended to replace advice given to you by your health care provider. Make sure you discuss any questions you have with your health care provider. Document Revised: 07/17/2020 Document Reviewed: 07/17/2020 Elsevier Patient Education  2024 Elsevier Inc.     Edwina Barth, MD [facility]

## 2022-10-02 NOTE — Assessment & Plan Note (Signed)
Secondary stroke prevention measures discussed. BP Readings from Last 3 Encounters:  10/02/22 134/88  04/01/22 126/80  01/07/22 120/68

## 2022-10-02 NOTE — Assessment & Plan Note (Signed)
Stable and well-controlled. Continues omeprazole 40 mg daily

## 2022-10-02 NOTE — Assessment & Plan Note (Signed)
Recommend lung cancer screening

## 2022-10-02 NOTE — Assessment & Plan Note (Signed)
Eating better and staying physically active Diet and nutrition discussed Continue atorvastatin 80 mg daily Lipid profile done today

## 2022-10-02 NOTE — Patient Instructions (Signed)
Health Maintenance After Age 74 After age 74, you are at a higher risk for certain long-term diseases and infections as well as injuries from falls. Falls are a major cause of broken bones and head injuries in people who are older than age 74. Getting regular preventive care can help to keep you healthy and well. Preventive care includes getting regular testing and making lifestyle changes as recommended by your health care provider. Talk with your health care provider about: Which screenings and tests you should have. A screening is a test that checks for a disease when you have no symptoms. A diet and exercise plan that is right for you. What should I know about screenings and tests to prevent falls? Screening and testing are the best ways to find a health problem early. Early diagnosis and treatment give you the best chance of managing medical conditions that are common after age 74. Certain conditions and lifestyle choices may make you more likely to have a fall. Your health care provider may recommend: Regular vision checks. Poor vision and conditions such as cataracts can make you more likely to have a fall. If you wear glasses, make sure to get your prescription updated if your vision changes. Medicine review. Work with your health care provider to regularly review all of the medicines you are taking, including over-the-counter medicines. Ask your health care provider about any side effects that may make you more likely to have a fall. Tell your health care provider if any medicines that you take make you feel dizzy or sleepy. Strength and balance checks. Your health care provider may recommend certain tests to check your strength and balance while standing, walking, or changing positions. Foot health exam. Foot pain and numbness, as well as not wearing proper footwear, can make you more likely to have a fall. Screenings, including: Osteoporosis screening. Osteoporosis is a condition that causes  the bones to get weaker and break more easily. Blood pressure screening. Blood pressure changes and medicines to control blood pressure can make you feel dizzy. Depression screening. You may be more likely to have a fall if you have a fear of falling, feel depressed, or feel unable to do activities that you used to do. Alcohol use screening. Using too much alcohol can affect your balance and may make you more likely to have a fall. Follow these instructions at home: Lifestyle Do not drink alcohol if: Your health care provider tells you not to drink. If you drink alcohol: Limit how much you have to: 0-1 drink a day for women. 0-2 drinks a day for men. Know how much alcohol is in your drink. In the [place] one drink equals one 12 oz bottle of beer (355 mL), one 5 oz glass of wine (148 mL), or one 1 oz glass of hard liquor (44 mL). Do not use any products that contain nicotine or tobacco. These products include cigarettes, chewing tobacco, and vaping devices, such as e-cigarettes. If you need help quitting, ask your health care provider. Activity  Follow a regular exercise program to stay fit. This will help you maintain your balance. Ask your health care provider what types of exercise are appropriate for you. If you need a cane or walker, use it as recommended by your health care provider. Wear supportive shoes that have nonskid soles. Safety  Remove any tripping hazards, such as rugs, cords, and clutter. Install safety equipment such as grab bars in bathrooms and safety rails on stairs. Keep rooms and walkways   well-lit. General instructions Talk with your health care provider about your risks for falling. Tell your health care provider if: You fall. Be sure to tell your health care provider about all falls, even ones that seem minor. You feel dizzy, tiredness (fatigue), or off-balance. Take over-the-counter and prescription medicines only as told by your health care provider. These include  supplements. Eat a healthy diet and maintain a healthy weight. A healthy diet includes low-fat dairy products, low-fat (lean) meats, and fiber from whole grains, beans, and lots of fruits and vegetables. Stay current with your vaccines. Schedule regular health, dental, and eye exams. Summary Having a healthy lifestyle and getting preventive care can help to protect your health and wellness after age 74. Screening and testing are the best way to find a health problem early and help you avoid having a fall. Early diagnosis and treatment give you the best chance for managing medical conditions that are more common for people who are older than age 74. Falls are a major cause of broken bones and head injuries in people who are older than age 74. Take precautions to prevent a fall at home. Work with your health care provider to learn what changes you can make to improve your health and wellness and to prevent falls. This information is not intended to replace advice given to you by your health care provider. Make sure you discuss any questions you have with your health care provider. Document Revised: 07/17/2020 Document Reviewed: 07/17/2020 Elsevier Patient Education  2024 Elsevier Inc.  

## 2022-10-02 NOTE — Assessment & Plan Note (Signed)
Running its course without complications May use over-the-counter Mucinex DM as needed Advised to rest and stay well-hydrated

## 2022-10-07 ENCOUNTER — Encounter: Payer: Self-pay | Admitting: Gastroenterology

## 2022-10-07 DIAGNOSIS — Z85828 Personal history of other malignant neoplasm of skin: Secondary | ICD-10-CM | POA: Diagnosis not present

## 2022-10-07 DIAGNOSIS — D044 Carcinoma in situ of skin of scalp and neck: Secondary | ICD-10-CM | POA: Diagnosis not present

## 2022-10-07 DIAGNOSIS — D2271 Melanocytic nevi of right lower limb, including hip: Secondary | ICD-10-CM | POA: Diagnosis not present

## 2022-10-07 DIAGNOSIS — L570 Actinic keratosis: Secondary | ICD-10-CM | POA: Diagnosis not present

## 2022-10-07 DIAGNOSIS — D2261 Melanocytic nevi of right upper limb, including shoulder: Secondary | ICD-10-CM | POA: Diagnosis not present

## 2022-10-07 DIAGNOSIS — D485 Neoplasm of uncertain behavior of skin: Secondary | ICD-10-CM | POA: Diagnosis not present

## 2022-10-07 DIAGNOSIS — C44529 Squamous cell carcinoma of skin of other part of trunk: Secondary | ICD-10-CM | POA: Diagnosis not present

## 2022-10-07 DIAGNOSIS — D2262 Melanocytic nevi of left upper limb, including shoulder: Secondary | ICD-10-CM | POA: Diagnosis not present

## 2022-11-06 ENCOUNTER — Encounter: Payer: Self-pay | Admitting: Gastroenterology

## 2022-11-06 ENCOUNTER — Ambulatory Visit (AMBULATORY_SURGERY_CENTER): Payer: PPO

## 2022-11-06 VITALS — Ht 72.0 in | Wt 197.0 lb

## 2022-11-06 DIAGNOSIS — Z86010 Personal history of colonic polyps: Secondary | ICD-10-CM

## 2022-11-06 MED ORDER — PEG 3350-KCL-NA BICARB-NACL 420 G PO SOLR: 4000.0000 mL | Freq: Once | ORAL | 0 refills | Status: AC

## 2022-11-06 NOTE — Progress Notes (Signed)
 Pre visit completed via phone call; Patient verified name, DOB, and address;  No egg or soy allergy known to patient;  No issues known to pt with past sedation with any surgeries or procedures; Patient denies ever being told they had issues or difficulty with intubation;  No FH of Malignant Hyperthermia; Pt is not on diet pills; Pt is not on home 02;  Pt is not on blood thinners;  Pt denies issues with constipation;  No A fib or A flutter;  Have any cardiac testing pending--NO Insurance verified during PV appt--- [employer] Medicare  Pt can ambulate without assistance;  Pt denies use of chewing tobacco; Discussed diabetic/weight loss medication holds; Discussed NSAID holds; Checked BMI to be less than 50; Pt instructed to use Singlecare.com or GoodRx for a price reduction on prep;  Patient's chart reviewed by Cathlyn Parsons CNRA prior to previsit and patient appropriate for the [facility]; Pre visit completed and red dot placed by patient's name on their procedure day (on provider's schedule);  Instructions sent to patient via MyChart per his request;

## 2022-11-19 ENCOUNTER — Telehealth: Payer: Self-pay | Admitting: Gastroenterology

## 2022-11-19 ENCOUNTER — Telehealth: Payer: Self-pay

## 2022-11-19 DIAGNOSIS — H10231 Serous conjunctivitis, except viral, right eye: Secondary | ICD-10-CM | POA: Diagnosis not present

## 2022-11-19 DIAGNOSIS — S0501XA Injury of conjunctiva and corneal abrasion without foreign body, right eye, initial encounter: Secondary | ICD-10-CM | POA: Diagnosis not present

## 2022-11-19 DIAGNOSIS — Z961 Presence of intraocular lens: Secondary | ICD-10-CM | POA: Diagnosis not present

## 2022-11-19 DIAGNOSIS — H43813 Vitreous degeneration, bilateral: Secondary | ICD-10-CM | POA: Diagnosis not present

## 2022-11-19 DIAGNOSIS — H53462 Homonymous bilateral field defects, left side: Secondary | ICD-10-CM | POA: Diagnosis not present

## 2022-11-19 DIAGNOSIS — H53461 Homonymous bilateral field defects, right side: Secondary | ICD-10-CM | POA: Diagnosis not present

## 2022-11-19 DIAGNOSIS — H5203 Hypermetropia, bilateral: Secondary | ICD-10-CM | POA: Diagnosis not present

## 2022-11-19 DIAGNOSIS — H52223 Regular astigmatism, bilateral: Secondary | ICD-10-CM | POA: Diagnosis not present

## 2022-11-19 DIAGNOSIS — H524 Presbyopia: Secondary | ICD-10-CM | POA: Diagnosis not present

## 2022-11-19 NOTE — Telephone Encounter (Signed)
Spoke with patient regarding lung screening referral. Patient reports he quit smoking in 1985 which would make him ineligible for a screening LDCT, as he is not within the 15 years as non-smoker.  Will route note to PCP that patient is interested in an alternative scan, such as CT chest wo, if recommended. Patient acknowledged understanding.

## 2022-11-19 NOTE — Telephone Encounter (Signed)
PT along with wife are calling about pre cert letter. Patient feels it is coded wrong. Last procedure was 10 years ago and no polyps. Requesting callback to discuss.

## 2022-11-20 DIAGNOSIS — H52223 Regular astigmatism, bilateral: Secondary | ICD-10-CM | POA: Diagnosis not present

## 2022-11-20 DIAGNOSIS — H53461 Homonymous bilateral field defects, right side: Secondary | ICD-10-CM | POA: Diagnosis not present

## 2022-11-20 DIAGNOSIS — H524 Presbyopia: Secondary | ICD-10-CM | POA: Diagnosis not present

## 2022-11-20 DIAGNOSIS — Z961 Presence of intraocular lens: Secondary | ICD-10-CM | POA: Diagnosis not present

## 2022-11-20 DIAGNOSIS — H5203 Hypermetropia, bilateral: Secondary | ICD-10-CM | POA: Diagnosis not present

## 2022-11-20 DIAGNOSIS — H53462 Homonymous bilateral field defects, left side: Secondary | ICD-10-CM | POA: Diagnosis not present

## 2022-11-20 DIAGNOSIS — H10231 Serous conjunctivitis, except viral, right eye: Secondary | ICD-10-CM | POA: Diagnosis not present

## 2022-11-20 DIAGNOSIS — H43813 Vitreous degeneration, bilateral: Secondary | ICD-10-CM | POA: Diagnosis not present

## 2022-11-20 DIAGNOSIS — S0501XA Injury of conjunctiva and corneal abrasion without foreign body, right eye, initial encounter: Secondary | ICD-10-CM | POA: Diagnosis not present

## 2022-11-21 DIAGNOSIS — S0501XA Injury of conjunctiva and corneal abrasion without foreign body, right eye, initial encounter: Secondary | ICD-10-CM | POA: Diagnosis not present

## 2022-11-21 DIAGNOSIS — H52223 Regular astigmatism, bilateral: Secondary | ICD-10-CM | POA: Diagnosis not present

## 2022-11-21 DIAGNOSIS — H5203 Hypermetropia, bilateral: Secondary | ICD-10-CM | POA: Diagnosis not present

## 2022-11-21 DIAGNOSIS — H10231 Serous conjunctivitis, except viral, right eye: Secondary | ICD-10-CM | POA: Diagnosis not present

## 2022-11-21 DIAGNOSIS — H524 Presbyopia: Secondary | ICD-10-CM | POA: Diagnosis not present

## 2022-11-21 DIAGNOSIS — H53461 Homonymous bilateral field defects, right side: Secondary | ICD-10-CM | POA: Diagnosis not present

## 2022-11-21 DIAGNOSIS — H53462 Homonymous bilateral field defects, left side: Secondary | ICD-10-CM | POA: Diagnosis not present

## 2022-11-21 DIAGNOSIS — Z961 Presence of intraocular lens: Secondary | ICD-10-CM | POA: Diagnosis not present

## 2022-11-21 DIAGNOSIS — H43813 Vitreous degeneration, bilateral: Secondary | ICD-10-CM | POA: Diagnosis not present

## 2022-11-22 NOTE — Telephone Encounter (Signed)
Inbound call from patient spouse in regards to previous note. Requesting a f/u call from previous note on 9/10. Please advise at   [phone]

## 2022-11-25 NOTE — Telephone Encounter (Signed)
Pt's wife called and states she believes procedure will be coded incorrectly.  She states he didn't have polyps 10 years ago.  I noted he did have polyps back in 2010 and that is why it is a surveillance not preventative.  Pt is upset he didn't receive a phone call about he cost and I tried to explain to his wife that this letter is sent in place of a phone call.  I reread the precertification information letter to her and I encouraged her to call her insurance company.  I sent a message to Moshe Cipro, who pre-certified the information with the insurance company and she will call her .  I left message with wife that Edson Snowball will be calling her.

## 2022-11-25 NOTE — Telephone Encounter (Signed)
PT returning call to discuss coding for procedure on 9/18. Please advise.

## 2022-11-27 ENCOUNTER — Ambulatory Visit: Payer: PPO | Admitting: Gastroenterology

## 2022-11-27 ENCOUNTER — Encounter: Payer: Self-pay | Admitting: Gastroenterology

## 2022-11-27 VITALS — BP 125/94 | HR 77 | Temp 97.3°F | Resp 16 | Ht 72.0 in | Wt 197.0 lb

## 2022-11-27 DIAGNOSIS — Z860100 Personal history of colon polyps, unspecified: Secondary | ICD-10-CM

## 2022-11-27 DIAGNOSIS — K641 Second degree hemorrhoids: Secondary | ICD-10-CM | POA: Diagnosis not present

## 2022-11-27 DIAGNOSIS — Z1211 Encounter for screening for malignant neoplasm of colon: Secondary | ICD-10-CM

## 2022-11-27 DIAGNOSIS — Z09 Encounter for follow-up examination after completed treatment for conditions other than malignant neoplasm: Secondary | ICD-10-CM

## 2022-11-27 DIAGNOSIS — F419 Anxiety disorder, unspecified: Secondary | ICD-10-CM | POA: Diagnosis not present

## 2022-11-27 DIAGNOSIS — F32A Depression, unspecified: Secondary | ICD-10-CM | POA: Diagnosis not present

## 2022-11-27 DIAGNOSIS — D125 Benign neoplasm of sigmoid colon: Secondary | ICD-10-CM | POA: Diagnosis not present

## 2022-11-27 MED ORDER — SODIUM CHLORIDE 0.9 % IV SOLN: 500.0000 mL | Freq: Once | INTRAVENOUS | Status: DC

## 2022-11-27 NOTE — Progress Notes (Signed)
Called to room to assist during endoscopic procedure.  Patient ID and intended procedure confirmed with present staff. Received instructions for my participation in the procedure from the performing physician.  

## 2022-11-27 NOTE — Patient Instructions (Addendum)
Continue present medications. Await pathology results. Repeat colonoscopy in 5-10 years for surveillance based on pathology results. Return to GI office as needed    YOU HAD AN ENDOSCOPIC PROCEDURE TODAY AT THE [facility]:   Refer to the procedure report that was given to you for any specific questions about what was found during the examination.  If the procedure report does not answer your questions, please call your gastroenterologist to clarify.  If you requested that your care partner not be given the details of your procedure findings, then the procedure report has been included in a sealed envelope for you to review at your convenience later.  YOU SHOULD EXPECT: Some feelings of bloating in the abdomen. Passage of more gas than usual.  Walking can help get rid of the air that was put into your GI tract during the procedure and reduce the bloating. If you had a lower endoscopy (such as a colonoscopy or flexible sigmoidoscopy) you may notice spotting of blood in your stool or on the toilet paper. If you underwent a bowel prep for your procedure, you may not have a normal bowel movement for a few days.  Please Note:  You might notice some irritation and congestion in your nose or some drainage.  This is from the oxygen used during your procedure.  There is no need for concern and it should clear up in a day or so.  SYMPTOMS TO REPORT IMMEDIATELY:  Following lower endoscopy (colonoscopy or flexible sigmoidoscopy):  Excessive amounts of blood in the stool  Significant tenderness or worsening of abdominal pains  Swelling of the abdomen that is new, acute  Fever of 100F or higher  For urgent or emergent issues, a gastroenterologist can be reached at any hour by calling [phone]. Do not use MyChart messaging for urgent concerns.    DIET:  We do recommend a small meal at first, but then you may proceed to your regular diet.  Drink plenty of fluids but you should avoid  alcoholic beverages for 24 hours.  ACTIVITY:  You should plan to take it easy for the rest of today and you should NOT DRIVE or use heavy machinery until tomorrow (because of the sedation medicines used during the test).    FOLLOW UP: Our staff will call the number listed on your records the next business day following your procedure.  We will call around 7:15- 8:00 am to check on you and address any questions or concerns that you may have regarding the information given to you following your procedure. If we do not reach you, we will leave a message.     If any biopsies were taken you will be contacted by phone or by letter within the next 1-3 weeks.  Please call us at [phone] if you have not heard about the biopsies in 3 weeks.    SIGNATURES/CONFIDENTIALITY: You and/or your care partner have signed paperwork which will be entered into your electronic medical record.  These signatures attest to the fact that that the information above on your After Visit Summary has been reviewed and is understood.  Full responsibility of the confidentiality of this discharge information lies with you and/or your care-partner.

## 2022-11-27 NOTE — Progress Notes (Signed)
GASTROENTEROLOGY PROCEDURE H&P NOTE   Primary Care Physician: Georgina Quint, MD    Reason for Procedure:  Colon Cancer screening  Plan:    Colonoscopy  Patient is appropriate for endoscopic procedure(s) in the ambulatory ([facility]) setting.  The nature of the procedure, as well as the risks, benefits, and alternatives were carefully and thoroughly reviewed with the patient. Ample time for discussion and questions allowed. The patient understood, was satisfied, and agreed to proceed.     HPI: Tyrone Curtis is a 72 y.o. male who presents for colonoscopy for ongoing Colon Cancer screening.  No active GI symptoms.  No known family history of colon cancer or related malignancy.  Patient is otherwise without complaints or active issues today.  Last colonoscopy was 09/2012 at [facility] and normal/no polyps.  Prior to that had a colonoscopy in 02/2009 which was notable for 2 polyps 5-6 mm, internal hemorrhoids and recommended repeat in 3 years (path hyperplastic polyp and benign colonic mucosa; no adenoma)  Past Medical History:  Diagnosis Date   Allergic rhinitis, cause unspecified    Anxiety state, unspecified    Arthritis    RIGHT shoulder   Basal cell carcinoma    facial   Cataract    bilaterally sx   Chicken pox    CVA (cerebral vascular accident) (HCC) 2012   LEFT sided weakness   Depression    Diaphragmatic hernia without mention of obstruction or gangrene    Dysphagia, unspecified(787.20)    Erectile dysfunction    GERD (gastroesophageal reflux disease)    on meds- CVA affected LEFT side of esophagus   Herpes zoster without mention of complication    Hyperlipidemia    [employer] 04/18/2020   Internal hemorrhoids without mention of complication    Measles    Mumps    x 2   Peripheral vision loss, left 2012   depth preception off as well in the LEFT eye   Personal history of colonic polyps    Pure hypercholesterolemia    Regurgitation     Seasonal allergies    Stroke (HCC) 2012    Past Surgical History:  Procedure Laterality Date   basal cell carcinoma of anterior chest  2000   resection; [place]   cataract surgery  2010   with lens repacement Left   COLONOSCOPY  09/23/12   normal.  Symptoms: anemia, hemoccult+.  [facility].   ESOPHAGOGASTRODUODENOSCOPY  09/23/12   normal.  Symptoms: anemia, hemoccult +, GERD.   EYE SURGERY Bilateral    cataract   FRACTURE SURGERY Left    1957 left arm   small bowel mass  1991   removed part of small intestine benign mass   SMALL INTESTINE SURGERY      Prior to Admission medications   Medication Sig Start Date End Date Taking? Authorizing Provider  Acetaminophen (TYLENOL PO) Take by mouth as needed.   Yes [provider]  ASPIRIN 81 PO Take 162 mg by mouth daily.   Yes [provider]  atorvastatin (LIPITOR) 80 MG tablet TAKE 1 TABLET BY MOUTH DAILY 10/02/22  Yes Sagardia, Eilleen Kempf, MD  loratadine (CLARITIN) 10 MG tablet Take 10 mg by mouth daily.   Yes [provider]  Multiple Vitamin (MULTIVITAMINS PO) Take 0.5 tablets by mouth daily.   Yes [provider]  omeprazole (PRILOSEC) 40 MG capsule Take 1 capsule by mouth daily 05/06/22  Yes Sagardia, Eilleen Kempf, MD    Current Outpatient Medications  Medication Sig Dispense Refill   Acetaminophen (TYLENOL PO) Take by mouth as needed.     ASPIRIN 81 PO Take 162 mg by mouth daily.     atorvastatin (LIPITOR) 80 MG tablet TAKE 1 TABLET BY MOUTH DAILY 90 tablet 3   loratadine (CLARITIN) 10 MG tablet Take 10 mg by mouth daily.     Multiple Vitamin (MULTIVITAMINS PO) Take 0.5 tablets by mouth daily.     omeprazole (PRILOSEC) 40 MG capsule Take 1 capsule by mouth daily 90 capsule 1   Current Facility-Administered Medications  Medication Dose Route Frequency Provider Last Rate Last Admin   0.9 %  sodium chloride infusion  500 mL Intravenous Once Racine Erby V, DO        Allergies as  of 11/27/2022 - Review Complete 11/27/2022  Allergen Reaction Noted   Zyrtec d [cetirizine-pseudoephedrine er]  12/02/2011    Family History  Problem Relation Age of Onset   Diabetes Mother    Depression Mother    Esophageal varices Mother    Hyperlipidemia Mother    Mental illness Mother    Liver disease Mother    Cancer Father        unknown   Hyperlipidemia Sister    Mental illness Sister        anxiety   Liver disease Sister    Hyperlipidemia Brother    Diabetes Brother    Arthritis Brother        Knee replacement   Bipolar disorder Brother    Liver disease Brother    Heart disease Brother 88       AMI/CAD with stenting   Colon polyps Neg Hx    Colon cancer Neg Hx    Esophageal cancer Neg Hx    Stomach cancer Neg Hx    Rectal cancer Neg Hx     Social History   Socioeconomic History   Marital status: Married    Spouse name: Tyrone Curtis   Number of children: 1   Years of education: Not on file   Highest education level: Not on file  Occupational History   Occupation: Disability    Comment: CVA   Occupation: Previous Network engineer of [employer]  Tobacco Use   Smoking status: Former    Current packs/day: 2.00    Average packs/day: 2.0 packs/day for 25.0 years (50.0 ttl pk-yrs)    Types: Cigarettes   Smokeless tobacco: Never   Tobacco comments:    quit in 1984  Vaping Use   Vaping status: Never Used  Substance and Sexual Activity   Alcohol use: Yes    Alcohol/week: 4.0 standard drinks of alcohol    Types: 4 Cans of beer per week   Drug use: No   Sexual activity: Yes  Other Topics Concern   Not on file  Social History Narrative   Marital status:  Married x 43 years      Children: 1 daughte (45)r; 1 grandson (17)      Lives: with wife; has mountain house and [place] house at [employer]      Employment: disabled since CVA      Tobacco: none      Alcohol:  1-2 drinks per night      Drugs:  None      Exercise: very active in the yard. No formal exercise program in  2019.      ADLs: independent with ADLs; drives locally; maintains own yard.        Advanced Directives:  NONE  in 2019.  FULL CODE; no prolonged measures.         Social Determinants of Health   Financial Resource Strain: Low Risk  (08/27/2022)   Overall Financial Resource Strain (CARDIA)    Difficulty of Paying Living Expenses: Not hard at all  Food Insecurity: No Food Insecurity (08/27/2022)   Hunger Vital Sign    Worried About Running Out of Food in the Last Year: Never true    Ran Out of Food in the Last Year: Never true  Transportation Needs: No Transportation Needs (08/27/2022)   PRAPARE - Administrator, Civil Service (Medical): No    Lack of Transportation (Non-Medical): No  Physical Activity: Sufficiently Active (08/27/2022)   Exercise Vital Sign    Days of Exercise per Week: 7 days    Minutes of Exercise per Session: 60 min  Stress: Stress Concern Present (08/27/2022)   [employer] of Occupational Health - Occupational Stress Questionnaire    Feeling of Stress : To some extent  Social Connections: Unknown (08/27/2022)   Social Connection and Isolation Panel [NHANES]    Frequency of Communication with Friends and Family: More than three times a week    Frequency of Social Gatherings with Friends and Family: Twice a week    Attends Religious Services: Patient unable to answer    Active Member of Clubs or Organizations: No    Attends Banker Meetings: Never    Marital Status: Married  Catering manager Violence: Not At Risk (08/27/2022)   Humiliation, Afraid, Rape, and Kick questionnaire    Fear of Current or Ex-Partner: No    Emotionally Abused: No    Physically Abused: No    Sexually Abused: No    Physical Exam: Vital signs in last 24 hours: @BP  (!) 146/89 (BP Location: Right Arm, Patient Position: Sitting, Cuff Size: Normal)   Pulse 87   Temp (!) 97.3 F (36.3 C) (Temporal)   Ht 6' (1.829 m)   Wt 197 lb (89.4 kg)   SpO2 98%   BMI 26.72  kg/m  GEN: NAD EYE: Sclerae anicteric ENT: MMM CV: Non-tachycardic Pulm: CTA b/l GI: Soft, NT/ND NEURO:  Alert & Oriented x 3   Doristine Locks, DO [facility]   11/27/2022 7:50 AM

## 2022-11-27 NOTE — Op Note (Signed)
Tyrone Curtis Patient Name: Tyrone Curtis Procedure Date: 11/27/2022 8:14 AM MRN: [id number] Endoscopist: Doristine Locks , MD, [id number] Age: 72 Referring MD:  Date of Birth: February 27, 1951 Gender: Male Account #: [id number] Procedure:                Colonoscopy Indications:              Screening for colorectal malignant neoplasm (last                            colonoscopy was 10 years ago)                           Last colonoscopy was 09/2012 at [facility] and normal/no polyps. Prior to that had a                            colonoscopy in 02/2009 which was notable for 2                            polyps 5-6 mm, internal hemorrhoids Medicines:                Monitored Anesthesia Care Procedure:                Pre-Anesthesia Assessment:                           - Prior to the procedure, a History and Physical                            was performed, and patient medications and                            allergies were reviewed. The patient's tolerance of                            previous anesthesia was also reviewed. The risks                            and benefits of the procedure and the sedation                            options and risks were discussed with the patient.                            All questions were answered, and informed consent                            was obtained. Prior Anticoagulants: The patient has                            taken no anticoagulant or antiplatelet agents. ASA  Grade Assessment: II - A patient with mild systemic                            disease. After reviewing the risks and benefits,                            the patient was deemed in satisfactory condition to                            undergo the procedure.                           After obtaining informed consent, the colonoscope                            was passed under direct vision. Throughout  the                            procedure, the patient's blood pressure, pulse, and                            oxygen saturations were monitored continuously. The                            Olympus Scope [id number] was introduced through the                            anus and advanced to the the terminal ileum. The                            colonoscopy was performed without difficulty. The                            patient tolerated the procedure well. The quality                            of the bowel preparation was good. The terminal                            ileum, ileocecal valve, appendiceal orifice, and                            rectum were photographed. Scope In: 8:24:21 AM Scope Out: 8:47:55 AM Scope Withdrawal Time: 0 hours 10 minutes 40 seconds  Total Procedure Duration: 0 hours 23 minutes 34 seconds  Findings:                 Skin tags were found on perianal exam.                           A 4 mm polyp was found in the sigmoid colon. The                            polyp was sessile. The polyp was removed  with a                            cold snare. Resection and retrieval were complete.                            Estimated blood loss was minimal.                           Non-bleeding internal hemorrhoids were found during                            retroflexion. The hemorrhoids were medium-sized and                            Grade II (internal hemorrhoids that prolapse but                            reduce spontaneously).                           The exam was otherwise normal throughout the                            remainder of the colon.                           The terminal ileum appeared normal. Complications:            No immediate complications. Estimated Blood Loss:     Estimated blood loss was minimal. Impression:               - Perianal skin tags found on perianal exam.                           - One 4 mm polyp in the sigmoid colon, removed with                             a cold snare. Resected and retrieved.                           - Non-bleeding internal hemorrhoids.                           - The examined portion of the ileum was normal. Recommendation:           - Patient has a contact number available for                            emergencies. The signs and symptoms of potential                            delayed complications were discussed with the                            patient. Return to normal activities tomorrow.  Written discharge instructions were provided to the                            patient.                           - Resume previous diet.                           - Continue present medications.                           - Await pathology results.                           - Repeat colonoscopy in 5-10 years for surveillance                            based on pathology results.                           - Return to GI office PRN. Doristine Locks, MD 11/27/2022 8:51:53 AM

## 2022-11-27 NOTE — Progress Notes (Signed)
Sedate, gd SR, tolerated procedure well, VSS, report to RN 

## 2022-11-27 NOTE — Progress Notes (Signed)
Vitals-CW  Pt's states no medical or surgical changes since previsit or office visit. 

## 2022-11-28 ENCOUNTER — Telehealth: Payer: Self-pay

## 2022-11-28 DIAGNOSIS — C44529 Squamous cell carcinoma of skin of other part of trunk: Secondary | ICD-10-CM | POA: Diagnosis not present

## 2022-11-28 DIAGNOSIS — D235 Other benign neoplasm of skin of trunk: Secondary | ICD-10-CM | POA: Diagnosis not present

## 2022-11-28 NOTE — Telephone Encounter (Signed)
  Follow up Call-     11/27/2022    7:42 AM 11/27/2022    7:38 AM  Call back number  Post procedure Call Back phone  # [phone]  Wife   Permission to leave phone message  Yes     Patient questions:  Do you have a fever, pain , or abdominal swelling? No. Pain Score  0 *  Have you tolerated food without any problems? Yes.    Have you been able to return to your normal activities? Yes.    Do you have any questions about your discharge instructions: Diet   No. Medications  No. Follow up visit  No.  Do you have questions or concerns about your Care? No.  Actions: * If pain score is 4 or above: No action needed, pain <4.

## 2022-11-29 LAB — SURGICAL PATHOLOGY

## 2022-12-04 ENCOUNTER — Encounter: Payer: Self-pay | Admitting: Emergency Medicine

## 2022-12-12 DIAGNOSIS — D044 Carcinoma in situ of skin of scalp and neck: Secondary | ICD-10-CM | POA: Diagnosis not present

## 2023-01-28 DIAGNOSIS — H5203 Hypermetropia, bilateral: Secondary | ICD-10-CM | POA: Diagnosis not present

## 2023-01-28 DIAGNOSIS — Z961 Presence of intraocular lens: Secondary | ICD-10-CM | POA: Diagnosis not present

## 2023-01-28 DIAGNOSIS — H43813 Vitreous degeneration, bilateral: Secondary | ICD-10-CM | POA: Diagnosis not present

## 2023-01-28 DIAGNOSIS — H53461 Homonymous bilateral field defects, right side: Secondary | ICD-10-CM | POA: Diagnosis not present

## 2023-01-28 DIAGNOSIS — H52223 Regular astigmatism, bilateral: Secondary | ICD-10-CM | POA: Diagnosis not present

## 2023-01-28 DIAGNOSIS — H524 Presbyopia: Secondary | ICD-10-CM | POA: Diagnosis not present

## 2023-01-28 DIAGNOSIS — H53462 Homonymous bilateral field defects, left side: Secondary | ICD-10-CM | POA: Diagnosis not present

## 2023-02-21 ENCOUNTER — Encounter: Payer: Self-pay | Admitting: Emergency Medicine

## 2023-02-21 ENCOUNTER — Other Ambulatory Visit: Payer: Self-pay | Admitting: Radiology

## 2023-02-21 DIAGNOSIS — K219 Gastro-esophageal reflux disease without esophagitis: Secondary | ICD-10-CM

## 2023-02-21 MED ORDER — OMEPRAZOLE 40 MG PO CPDR: DELAYED_RELEASE_CAPSULE | ORAL | 1 refills | Status: DC

## 2023-03-18 ENCOUNTER — Emergency Department: Payer: PPO

## 2023-03-18 ENCOUNTER — Emergency Department
Admission: EM | Admit: 2023-03-18 | Discharge: 2023-03-19 | Disposition: A | Discharge status: Home / Self Care | Payer: PPO | Attending: Emergency Medicine | Admitting: Emergency Medicine

## 2023-03-18 ENCOUNTER — Other Ambulatory Visit: Payer: Self-pay

## 2023-03-18 DIAGNOSIS — R569 Unspecified convulsions: Secondary | ICD-10-CM | POA: Diagnosis not present

## 2023-03-18 DIAGNOSIS — I10 Essential (primary) hypertension: Secondary | ICD-10-CM | POA: Diagnosis not present

## 2023-03-18 DIAGNOSIS — I6782 Cerebral ischemia: Secondary | ICD-10-CM | POA: Diagnosis not present

## 2023-03-18 DIAGNOSIS — R410 Disorientation, unspecified: Secondary | ICD-10-CM | POA: Insufficient documentation

## 2023-03-18 DIAGNOSIS — R918 Other nonspecific abnormal finding of lung field: Secondary | ICD-10-CM | POA: Diagnosis not present

## 2023-03-18 DIAGNOSIS — R9082 White matter disease, unspecified: Secondary | ICD-10-CM | POA: Diagnosis not present

## 2023-03-18 DIAGNOSIS — I69354 Hemiplegia and hemiparesis following cerebral infarction affecting left non-dominant side: Secondary | ICD-10-CM | POA: Diagnosis not present

## 2023-03-18 DIAGNOSIS — G9389 Other specified disorders of brain: Secondary | ICD-10-CM | POA: Diagnosis not present

## 2023-03-18 DIAGNOSIS — I700 Atherosclerosis of aorta: Secondary | ICD-10-CM | POA: Diagnosis not present

## 2023-03-18 DIAGNOSIS — I6523 Occlusion and stenosis of bilateral carotid arteries: Secondary | ICD-10-CM | POA: Diagnosis not present

## 2023-03-18 DIAGNOSIS — I639 Cerebral infarction, unspecified: Secondary | ICD-10-CM | POA: Diagnosis not present

## 2023-03-18 LAB — CBC WITH DIFFERENTIAL/PLATELET
Abs Immature Granulocytes: 0.02 10*3/uL (ref 0.00–0.07)
Basophils Absolute: 0 10*3/uL (ref 0.0–0.1)
Basophils Relative: 0 %
Eosinophils Absolute: 0.4 10*3/uL (ref 0.0–0.5)
Eosinophils Relative: 4 %
HCT: 42.2 % (ref 39.0–52.0)
Hemoglobin: 14 g/dL (ref 13.0–17.0)
Immature Granulocytes: 0 %
Lymphocytes Relative: 22 %
Lymphs Abs: 2 10*3/uL (ref 0.7–4.0)
MCH: 31.6 pg (ref 26.0–34.0)
MCHC: 33.2 g/dL (ref 30.0–36.0)
MCV: 95.3 fL (ref 80.0–100.0)
Monocytes Absolute: 0.7 10*3/uL (ref 0.1–1.0)
Monocytes Relative: 8 %
Neutro Abs: 6 10*3/uL (ref 1.7–7.7)
Neutrophils Relative %: 66 %
Platelets: 234 10*3/uL (ref 150–400)
RBC: 4.43 MIL/uL (ref 4.22–5.81)
RDW: 12.4 % (ref 11.5–15.5)
WBC: 9.2 10*3/uL (ref 4.0–10.5)
nRBC: 0 % (ref 0.0–0.2)

## 2023-03-18 LAB — COMPREHENSIVE METABOLIC PANEL
ALT: 24 U/L (ref 0–44)
AST: 30 U/L (ref 15–41)
Albumin: 4.1 g/dL (ref 3.5–5.0)
Alkaline Phosphatase: 64 U/L (ref 38–126)
Anion gap: 12 (ref 5–15)
BUN: 17 mg/dL (ref 8–23)
CO2: 23 mmol/L (ref 22–32)
Calcium: 9.1 mg/dL (ref 8.9–10.3)
Chloride: 102 mmol/L (ref 98–111)
Creatinine, Ser: 0.98 mg/dL (ref 0.61–1.24)
GFR, Estimated: >60 mL/min (ref >60)
Glucose, Bld: 137 mg/dL — ABNORMAL HIGH (ref 70–99)
Potassium: 3.8 mmol/L (ref 3.5–5.1)
Sodium: 137 mmol/L (ref 135–145)
Total Bilirubin: 0.5 mg/dL (ref 0.0–1.2)
Total Protein: 7.1 g/dL (ref 6.5–8.1)

## 2023-03-18 LAB — LACTIC ACID, PLASMA: Lactic Acid, Venous: 2.9 mmol/L (ref 0.5–1.9)

## 2023-03-18 LAB — TROPONIN I (HIGH SENSITIVITY): Troponin I (High Sensitivity): 9 ng/L (ref <18)

## 2023-03-18 LAB — MAGNESIUM: Magnesium: 1.8 mg/dL (ref 1.7–2.4)

## 2023-03-18 LAB — PROTIME-INR
INR: 0.9 (ref 0.8–1.2)
Prothrombin Time: 12.8 s (ref 11.4–15.2)

## 2023-03-18 MED ORDER — LACTATED RINGERS IV BOLUS (SEPSIS)
1000.0000 mL | Freq: Once | INTRAVENOUS | Status: AC
Start: 2023-03-18 — End: 2023-03-19
  Administered 2023-03-18: 1000 mL via INTRAVENOUS
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
## 2023-03-19 ENCOUNTER — Emergency Department: Payer: PPO

## 2023-03-19 ENCOUNTER — Telehealth: Payer: Self-pay

## 2023-03-19 ENCOUNTER — Telehealth: Payer: Self-pay | Admitting: Neurology

## 2023-03-19 ENCOUNTER — Telehealth: Payer: Self-pay | Admitting: Emergency Medicine

## 2023-03-19 DIAGNOSIS — R569 Unspecified convulsions: Secondary | ICD-10-CM | POA: Diagnosis not present

## 2023-03-19 DIAGNOSIS — I639 Cerebral infarction, unspecified: Secondary | ICD-10-CM | POA: Diagnosis not present

## 2023-03-19 LAB — URINALYSIS, W/ REFLEX TO CULTURE (INFECTION SUSPECTED)
Bacteria, UA: NONE SEEN
Bilirubin Urine: NEGATIVE
Glucose, UA: NEGATIVE mg/dL
Hgb urine dipstick: NEGATIVE
Ketones, ur: NEGATIVE mg/dL
Leukocytes,Ua: NEGATIVE
Nitrite: NEGATIVE
Protein, ur: NEGATIVE mg/dL
Specific Gravity, Urine: 1.02 (ref 1.005–1.030)
Squamous Epithelial / HPF: 0 /[HPF] (ref 0–5)
pH: 6 (ref 5.0–8.0)

## 2023-03-19 LAB — URINE DRUG SCREEN, QUALITATIVE (ARMC ONLY)
Amphetamines, Ur Screen: NOT DETECTED
Barbiturates, Ur Screen: NOT DETECTED
Benzodiazepine, Ur Scrn: NOT DETECTED
Cannabinoid 50 Ng, Ur ~~LOC~~: NOT DETECTED
Cocaine Metabolite,Ur ~~LOC~~: NOT DETECTED
MDMA (Ecstasy)Ur Screen: NOT DETECTED
Methadone Scn, Ur: NOT DETECTED
Opiate, Ur Screen: NOT DETECTED
Phencyclidine (PCP) Ur S: NOT DETECTED
Tricyclic, Ur Screen: NOT DETECTED

## 2023-03-19 LAB — ETHANOL: Alcohol, Ethyl (B): <10 mg/dL (ref <10)

## 2023-03-19 LAB — LACTIC ACID, PLASMA: Lactic Acid, Venous: 1.4 mmol/L (ref 0.5–1.9)

## 2023-03-19 MED ORDER — GADOBUTROL 1 MMOL/ML IV SOLN
9.0000 mL | Freq: Once | INTRAVENOUS | Status: AC | PRN
  Administered 2023-03-19: 9 mL via INTRAVENOUS

## 2023-03-19 NOTE — ED Provider Notes (Signed)
 [facility] Provider Note    Event Date/Time   First MD Initiated Contact with Patient 03/18/23 2307     (approximate)   History   Seizures   HPI Tyrone Curtis is a 73 y.o. male who presents for evaluation of probable seizure.  The patient does not remember what happened but his wife is at bedside.  She reports that they were watching the ball game in bed and then she looked over and saw that he was having generalized shaking and gaze deviation up into the right.  This lasted a few minutes.  He was very confused afterwards but is coming back around to normal now.  He remembers nothing of that.  She said that they had a good day but he commented when they were getting into bed tonight that he did not feel quite right but could not identify exactly how (states that he did not have a good day).  He denies any pain at this time and he has no new numbness or weakness.  He had a stroke about 13 years ago and had some residual left-sided weakness as a result of the stroke but that is at baseline.  He denies chest pain, nausea, vomiting, headache, neck pain, abdominal pain.     Physical Exam   Triage Vital Signs: ED Triage Vitals  Encounter Vitals Group     BP 03/18/23 2313 (!) 149/87     Systolic BP Percentile --      Diastolic BP Percentile --      Pulse Rate 03/18/23 2313 81     Resp 03/18/23 2313 (!) 21     Temp 03/18/23 2313 98.4 F (36.9 C)     Temp Source 03/18/23 2313 Oral     SpO2 03/18/23 2313 100 %     Weight --      Height --      Head Circumference --      Peak Flow --      Pain Score 03/18/23 2314 0     Pain Loc --      Pain Education --      Exclude from Growth Chart --     Most recent vital signs: Vitals:   03/19/23 0400 03/19/23 0500  BP: (!) 152/79 (!) 162/79  Pulse: 73 74  Resp: 13 18  Temp:    SpO2: 98% 99%    General: Awake, no distress.  He is awake and alert and [place] but does not remember what just happened to  him. CV:  Good peripheral perfusion.  Regular rate and rhythm. Resp:  Normal effort. Speaking easily and comfortably, no accessory muscle usage nor intercostal retractions.  Lungs clear to auscultation. Abd:  No distention.  No tenderness to palpation. Other:  No facial droop.  Mild weakness in his left arm and leg but reportedly this is at the baseline.  No obvious focal neurological deficits.   ED Results / Procedures / Treatments   Labs (all labs ordered are listed, but only abnormal results are displayed) Labs Reviewed  LACTIC ACID, PLASMA - Abnormal; Notable for the following components:      Result Value   Lactic Acid, Venous 2.9 (*)    All other components within normal limits  COMPREHENSIVE METABOLIC PANEL - Abnormal; Notable for the following components:   Glucose, Bld 137 (*)    All other components within normal limits  URINALYSIS, W/ REFLEX TO CULTURE (INFECTION SUSPECTED) - Abnormal; Notable for the following  components:   Color, Urine YELLOW (*)    APPearance CLEAR (*)    All other components within normal limits  LACTIC ACID, PLASMA  CBC WITH DIFFERENTIAL/PLATELET  PROTIME-INR  MAGNESIUM  ETHANOL  URINE DRUG SCREEN, QUALITATIVE ([facility] ONLY)  TROPONIN I (HIGH SENSITIVITY)     EKG  ED ECG REPORT I, Darleene Dome, the attending physician, personally viewed and interpreted this ECG.  Date: 03/18/2023 EKG Time: 22: 54 Rate: 80 Rhythm: sinus rhythm with atrial premature complex QRS Axis: normal Intervals: normal ST/T Wave abnormalities: normal Narrative Interpretation: no evidence of acute ischemia    RADIOLOGY I viewed and interpreted the patient's CT head and see no evidence of acute intracranial hemorrhage, mass, or obvious acute CVA.  Radiologist commented on chronic changes from prior CVA but no acute findings.   PROCEDURES:  Critical Care performed: No  .1-3 Lead EKG Interpretation  Performed by: Dome Darleene, MD Authorized by: Dome Darleene,  MD     Interpretation: normal     ECG rate:  70   ECG rate assessment: normal     Rhythm: sinus rhythm     Ectopy: none     Conduction: normal       IMPRESSION / MDM / ASSESSMENT AND PLAN / ED COURSE  I reviewed the triage vital signs and the nursing notes.                              Differential diagnosis includes, but is not limited to, new onset seizure, intracranial hemorrhage, CVA, intracranial mass, electrolyte or metabolic abnormality, medication or drug side effect, intoxication, acute infectious process such as meningitis.  Patient's presentation is most consistent with acute presentation with potential threat to life or bodily function.  Labs/studies ordered: CT head, 1 view chest x-ray, MRI brain with and without contrast, CBC with differential, CMP, magnesium, lactic acid, urinalysis, urine drug screen, ethanol, high-sensitivity troponin, pro time-INR which may be needed if the patient has an intracranial hemorrhage and requires blood products.  Interventions/Medications given:  Medications  lactated ringers  bolus 1,000 mL (0 mLs Intravenous Stopped 03/19/23 0121)  gadobutrol  (GADAVIST ) 1 MMOL/ML injection 9 mL (9 mLs Intravenous Contrast Given 03/19/23 0316)    (Note:  hospital course my include additional interventions and/or labs/studies not listed above.)   Patient is essentially back to baseline mental status.  No obvious instigators for his seizure or any recent medications that should lower his seizure threshold.  The patient is on the cardiac monitor to evaluate for evidence of arrhythmia and/or significant heart rate changes.  Labs thus far including metabolic panel, troponin, CBC, etc. are all within normal limits.  Given that this does sound like an actual seizure, I suspect his lactic acid will be slightly elevated and I ordered 1 L of fluids.  As documented above, his CT head shows no acute changes.  Given his history and the new onset seizure at age 57, I  ordered MR brain with and without contrast and the patient and family understand the plan.   Clinical Course as of 03/19/23 0516  Tue Mar 18, 2023  2359 Lactic Acid, Venous(!!): 2.9 The patient is noted to have a lactate>2. With the current information available to me, I don't think the patient is in septic shock. The lactate>2, is related to seizure activity  [CF]  Wed Mar 19, 2023  0513 Urine and urine drug screen are both normal [CF]  0513 MR  Brain W and Wo Contrast I viewed and interpreted the patient's MRI and there are large areas of abnormality.  However, the radiologist confirmed that this is chronic from his prior CVA and there does not seem to be any evidence of acute abnormality. [CF]  [id number] I reassessed the patient who has been back at baseline for hours.  He and his wife are eager to go home.  I explained the reassuring results.  We talked about starting antiepileptic drugs or not.  I offered to get a teleneurology consultation, but they feel confident that they can follow-up with his primary care doctor and/or his neurologist, Dr. Skeet, I think that is reasonable in a short period of time.  I think that is very reasonable but I gave strict return precautions and also counseled him that he should not drive under any circumstances.  The patient and his wife understand and agree and will return to the ED with new or worsening symptoms. [CF]    Clinical Course User Index [CF] Gordan Huxley, MD     FINAL CLINICAL IMPRESSION(S) / ED DIAGNOSES   Final diagnoses:  Seizure (HCC)     Rx / DC Orders   ED Discharge Orders     None        Note:  This document was prepared using Dragon voice recognition software and may include unintentional dictation errors.   Gordan Huxley, MD 03/19/23 [phone]

## 2023-03-19 NOTE — Telephone Encounter (Signed)
 Spoke to patients wife and made hospital f/u appt on 03/31/23. Did recommend going ahead and making an appointment with his neurologist. They understood

## 2023-03-19 NOTE — Telephone Encounter (Signed)
 PATIENT IS BEING RELEASED FROM THE HOSPITAL TODAY AND NEEDS A FOLLOW UP APPT HE HAD A SEIZURE

## 2023-03-19 NOTE — Telephone Encounter (Signed)
 Patient's wife calling for patient due to a seizure he had last night.  He was released from th ER this morning and wife stated that she called this morning and have not heard back from anyone.  She said the ER told her to call patient's doctor today.  Please call patient's wife back [phone] as soon as possible to speak to her about setting up a ER follow up.  Wife stated that patient has been sleeping a lot since returning home.

## 2023-03-19 NOTE — Telephone Encounter (Signed)
 Pt's wife called in stating the pt had a seizure for the first time ever and went to the ED on 03/18/23. She is wanting some advice. Pt is scheduled for 08/07/23 for 40 minutes and is on the wait list.

## 2023-03-19 NOTE — Discharge Instructions (Signed)
You have been seen in the emergency department today for a likely seizure.  Your workup today including labs are within normal limits.  Please follow up with your doctor as soon as possible regarding today's emergency department visit and your likely seizure.  You will also need to follow up with a neurologist as soon as possible, please call for appointment.  If you have been prescribed a medication for your seizures, please take this medication as prescribed.  As we have discussed it is very important that you DO NOT drive until you have been seen and cleared by your neurologist.  Please drink plenty of fluids, get plenty of sleep and avoid any alcohol or drug use.  Return to the emergency department if you have any further seizures, develop any weakness/numbness of any arm/leg, confusion, slurred speech, or sudden/severe headache.

## 2023-03-23 NOTE — Progress Notes (Signed)
 NEUROLOGY FOLLOW UP OFFICE NOTE  Tyrone Curtis [id number]  Assessment/Plan:   New onset seizure - likely triggered by increased drinking in setting of remote stroke. Alcohol abuse    Will check EEG.  If positive for epileptiform discharges, will start AED.  Otherwise, will hold off starting AED and advised to refrain from drinking. Discussed [place] law stating no driving for 6 months from last seizure.  However, given his homonymous hemianopsia, I recommended that he likely is unable to drive unless cleared by [facility] or ophthalmology. Alcohol cessation Follow up 3 months.  Subjective:  Tyrone Curtis is a 73 year old right-handed male with history of right PCA stroke whom I previously saw in February 2023 for suspected SUNCT presents today for new onset seizure.  History supplemented by ED note and his accompanying wife.  CT and MRI of brain personally reviewed.   On the evening of 03/18/2023, he was watching TV in bed with his wife when she turned to him and saw that he was exhibiting generalized shaking with eyes rolled back, foaming at mouth and unresponsive.  This lasted a few minutes  He was confused afterwards.  No tongue biting or incontinence.  He did state that he had to go to the bathroom which he did.  He has no recollection of anything until he was in the ambulance.  He was seen that night in the ED at [facility].  CT and follow up MRI of brain revealed large chronic right PCA territory infarct including involvement of the right hippocampal formation but no acute findings.  Blood pressure was elevated in the 150s-160s systolic.  Labs revealed elevated venous lactic acid of 2.9 but otherwise unremarkable, including CBC, CMP, troponin, UA, UDS and ethanol.  ECG unremarkable.  He had some dizziness and headache afterward.  He told his wife that evening that he wasn't feeling well.  Since his stroke in 2012, he has binge drinking off and on.  He typically will drink beer.  However, this  Christmas, he was drinking hard liquor including bourbon, rum and tequila.  That evening, he had a strong cup of coffee with rum.  No recent illness, head trauma or change in medication.    Of note, he has not had any recent SUNCT headaches.  03/18/2023 CT HEAD:  1. No acute intracranial CT findings or interval changes. 2. Chronic right occipitotemporal infarct with extension to the dorsal right thalamus. 3. Chronic lacunar infarct in the body of the right caudate nucleus. 4. Atrophy and small-vessel disease. 5. Sinus membrane disease. 03/19/2023 MRI BRAIN W WO:  1. No acute intracranial abnormality. 2. 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 3. Additional chronic small vessel disease, not significantly changed from 2023.  PAST MEDICAL HISTORY: Past Medical History:  Diagnosis Date   Allergic rhinitis, cause unspecified    Anxiety state, unspecified    Arthritis    RIGHT shoulder   Basal cell carcinoma    facial   Cataract    bilaterally sx   Chicken pox    CVA (cerebral vascular accident) (HCC) 2012   LEFT sided weakness   Depression    Diaphragmatic hernia without mention of obstruction or gangrene    Dysphagia, unspecified(787.20)    Erectile dysfunction    GERD (gastroesophageal reflux disease)    on meds- CVA affected LEFT side of esophagus   Herpes zoster without mention of complication    Hyperlipidemia    [employer]  04/18/2020   Internal hemorrhoids without mention of complication    Measles    Mumps    x 2   Peripheral vision loss, left 2012   depth preception off as well in the LEFT eye   Personal history of colonic polyps    Pure hypercholesterolemia    Regurgitation    Seasonal allergies    Stroke [facility]) 2012    MEDICATIONS: Current Outpatient Medications on File Prior to Visit  Medication Sig Dispense Refill   Acetaminophen  (TYLENOL  PO) Take by mouth as needed.     ASPIRIN 81 PO  Take 162 mg by mouth daily.     atorvastatin  (LIPITOR) 80 MG tablet TAKE 1 TABLET BY MOUTH DAILY 90 tablet 3   loratadine (CLARITIN) 10 MG tablet Take 10 mg by mouth daily.     Multiple Vitamin (MULTIVITAMINS PO) Take 0.5 tablets by mouth daily.     omeprazole  (PRILOSEC) 40 MG capsule Take 1 capsule by mouth daily 90 capsule 1   No current facility-administered medications on file prior to visit.    ALLERGIES: Allergies  Allergen Reactions   Zyrtec D [Cetirizine-Pseudoephedrine Er]     talking out of his head    FAMILY HISTORY: Family History  Problem Relation Age of Onset   Diabetes Mother    Depression Mother    Esophageal varices Mother    Hyperlipidemia Mother    Mental illness Mother    Liver disease Mother    Cancer Father        unknown   Hyperlipidemia Sister    Mental illness Sister        anxiety   Liver disease Sister    Hyperlipidemia Brother    Diabetes Brother    Arthritis Brother        Knee replacement   Bipolar disorder Brother    Liver disease Brother    Heart disease Brother 21       AMI/CAD with stenting   Colon polyps Neg Hx    Colon cancer Neg Hx    Esophageal cancer Neg Hx    Stomach cancer Neg Hx    Rectal cancer Neg Hx       Objective:  Blood pressure 132/82, pulse 73, height 5' 8 (1.727 m), weight 197 lb (89.4 kg), SpO2 98%. General: No acute distress.  Patient appears well-groomed.   Head:  Normocephalic/atraumatic Eyes:  Fundi examined but not visualized Neck: supple, no paraspinal tenderness, full range of motion Heart:  Regular rate and rhythm Neurological Exam: alert and oriented.  Speech fluent and not dysarthric, language intact.  Left homonymous hemianopsia.  Otherwise, CN II-XII intact. Bulk and tone normal, muscle strength 5/5 throughout.  Sensation to pinprick reduced in left foot.  Otherwise pinprick and vibratory sensation intact.  Deep tendon reflexes 2+ throughout, toes downgoing.  Finger to nose testing intact.  Gait  normal, Romberg negative.   Juliene Dunnings, DO  CC: Emil Aloysius Schaumann, MD

## 2023-03-24 ENCOUNTER — Encounter: Payer: Self-pay | Admitting: Neurology

## 2023-03-24 ENCOUNTER — Ambulatory Visit: Payer: PPO | Admitting: Neurology

## 2023-03-24 VITALS — BP 132/82 | HR 73 | Ht 68.0 in | Wt 197.0 lb

## 2023-03-24 DIAGNOSIS — R569 Unspecified convulsions: Secondary | ICD-10-CM | POA: Diagnosis not present

## 2023-03-24 DIAGNOSIS — Z8673 Personal history of transient ischemic attack (TIA), and cerebral infarction without residual deficits: Secondary | ICD-10-CM

## 2023-03-24 NOTE — Patient Instructions (Signed)
 1. Check 1 hour EEG.  2. Avoid activities in which a seizure would cause danger to yourself or to others.  Don't operate dangerous machinery, swim alone, or climb in high or dangerous places, such as on ladders, roofs, or girders.  Do not drive unless your doctor says you may.  3. If you have any warning that you may have a seizure, lay down in a safe place where you can't hurt yourself.    4.  No driving for 6 months from last seizure, as per [place]  state law.   Please refer to the following link on the [employer]ile[employer] of [employer]'s website for more information: http://www.epilepsyfoundation.org/answerplace/Social/driving/drivingu.cfm   5.  Maintain good sleep hygiene.  6.  Notify your neurology if you are planning pregnancy or if you become pregnant.  7.  Contact your doctor if you have any problems that may be related to the medicine you are taking.  8.  Call 911 and bring the patient back to the ED if:        A.  The seizure lasts longer than 5 minutes.       B.  The patient doesn't awaken shortly after the seizure  C.  The patient has new problems such as difficulty seeing, speaking or moving  D.  The patient was injured during the seizure  E.  The patient has a temperature over 102 F (39C)  F.  The patient vomited and now is having trouble breathing

## 2023-03-26 ENCOUNTER — Ambulatory Visit: Payer: PPO

## 2023-03-26 DIAGNOSIS — R569 Unspecified convulsions: Secondary | ICD-10-CM

## 2023-03-26 DIAGNOSIS — Z8673 Personal history of transient ischemic attack (TIA), and cerebral infarction without residual deficits: Secondary | ICD-10-CM

## 2023-03-26 NOTE — Progress Notes (Signed)
EEG complete - results pending 

## 2023-03-27 NOTE — Procedures (Signed)
ELECTROENCEPHALOGRAM REPORT  Date of Study: 03/26/2023  Patient's Name: Tyrone Curtis MRN: [id number] Date of Birth: 1950-10-25   Clinical History: 73 year old male with remote history of right PCA territory infarct presents with recent new-onset seizure  Medications: Current Outpatient Medications on File Prior to Visit  Medication Sig Dispense Refill   Acetaminophen (TYLENOL PO) Take by mouth as needed.     ASPIRIN 81 PO Take 162 mg by mouth daily.     atorvastatin (LIPITOR) 80 MG tablet TAKE 1 TABLET BY MOUTH DAILY 90 tablet 3   loratadine (CLARITIN) 10 MG tablet Take 10 mg by mouth daily.     Multiple Vitamin (MULTIVITAMINS PO) Take 0.5 tablets by mouth daily.     omeprazole (PRILOSEC) 40 MG capsule Take 1 capsule by mouth daily 90 capsule 1   No current facility-administered medications on file prior to visit.    Technical Summary: A multichannel digital EEG recording measured by the international 10-20 system with electrodes applied with paste and impedances below 5000 ohms performed in our laboratory with EKG monitoring in an awake and drowsy patient.  Photic stimulation was performed.  The digital EEG was referentially recorded, reformatted, and digitally filtered in a variety of bipolar and referential montages for optimal display.    Description: The patient is awake and drowsy during the recording.  During maximal wakefulness, there is a symmetric, medium voltage 8-9 Hz posterior dominant rhythm that attenuates with eye opening.  The record is symmetric.  During drowsiness, there is an increase in theta slowing of the background.  Stage 2 sleep was not seen.  Photic stimulation did not elicit any abnormalities.  There were no epileptiform discharges or electrographic seizures seen.    EKG lead was unremarkable.  Impression: This awake and drowsy EEG is normal.    Clinical Correlation: A normal EEG does not exclude a clinical diagnosis of epilepsy.  If further clinical  questions remain, prolonged EEG may be helpful.  Clinical correlation is advised.   Shon Millet, DO

## 2023-03-31 ENCOUNTER — Encounter: Payer: Self-pay | Admitting: Emergency Medicine

## 2023-03-31 ENCOUNTER — Ambulatory Visit (INDEPENDENT_AMBULATORY_CARE_PROVIDER_SITE_OTHER): Payer: PPO | Admitting: Emergency Medicine

## 2023-03-31 VITALS — BP 138/78 | HR 63 | Temp 98.2°F | Ht 68.0 in | Wt 204.0 lb

## 2023-03-31 DIAGNOSIS — E785 Hyperlipidemia, unspecified: Secondary | ICD-10-CM

## 2023-03-31 DIAGNOSIS — Z8673 Personal history of transient ischemic attack (TIA), and cerebral infarction without residual deficits: Secondary | ICD-10-CM

## 2023-03-31 DIAGNOSIS — Z09 Encounter for follow-up examination after completed treatment for conditions other than malignant neoplasm: Secondary | ICD-10-CM

## 2023-03-31 DIAGNOSIS — F1010 Alcohol abuse, uncomplicated: Secondary | ICD-10-CM | POA: Insufficient documentation

## 2023-03-31 DIAGNOSIS — R7303 Prediabetes: Secondary | ICD-10-CM

## 2023-03-31 DIAGNOSIS — R569 Unspecified convulsions: Secondary | ICD-10-CM | POA: Diagnosis not present

## 2023-03-31 DIAGNOSIS — K219 Gastro-esophageal reflux disease without esophagitis: Secondary | ICD-10-CM | POA: Diagnosis not present

## 2023-03-31 NOTE — Progress Notes (Signed)
Tyrone Curtis 73 y.o.   Chief Complaint  Patient presents with   Hospitalization Follow-up    Patient has some questions typed out for provider     HISTORY OF PRESENT ILLNESS: This is a 73 y.o. male here for follow-up of emergency department visit on 03/18/2023 when he presented after having a seizure at home witnessed by wife. Was later released and referred to neurologist.  Seizures felt to be secondary to alcohol consumption.  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 Seen by Dr. Everlena Cooper from [facility] neurology on 03/24/2023.  EEG done days later which look normal.  Decision was not to start antiseizure medication. Patient doing well.  No more seizures since. No other complaints or medical concerns today.  HPI   Prior to Admission medications   Medication Sig Start Date End Date Taking? Authorizing Provider  Acetaminophen (TYLENOL PO) Take by mouth as needed.   Yes [provider]  ASPIRIN 81 PO Take 162 mg by mouth daily.   Yes [provider]  atorvastatin (LIPITOR) 80 MG tablet TAKE 1 TABLET BY MOUTH DAILY 10/02/22  Yes Orianna Biskup, Eilleen Kempf, MD  loratadine (CLARITIN) 10 MG tablet Take 10 mg by mouth daily.   Yes [provider]  Multiple Vitamin (MULTIVITAMINS PO) Take 0.5 tablets by mouth daily.   Yes [provider]  omeprazole (PRILOSEC) 40 MG capsule Take 1 capsule by mouth daily 02/21/23  Yes Onetta Spainhower, Eilleen Kempf, MD    Allergies  Allergen Reactions   Zyrtec D [Cetirizine-Pseudoephedrine Er]     "talking out of his head"    Patient Active Problem List   Diagnosis Date Noted   Acute URI 10/02/2022   Trigger ring finger of right hand 10/01/2021   Chronic right shoulder pain 10/01/2021   History of stroke 03/27/2021   Prediabetes 01/02/2018   Overweight (BMI 25.0-29.9) 01/02/2018    History of basal cell carcinoma 08/12/2017   Paroxysmal tachycardia (HCC) 08/23/2016   Smoking history 08/23/2016   Adjustment disorder with mixed anxiety and depressed mood 07/16/2016   Dyslipidemia 01/06/2014   Gastroesophageal reflux disease without esophagitis 01/06/2014   Hemiparesis affecting left side as late effect of cerebrovascular accident (CVA) (HCC) 01/06/2014   Insomnia 07/26/2013   Erectile dysfunction 12/16/2011    Past Medical History:  Diagnosis Date   Allergic rhinitis, cause unspecified    Anxiety state, unspecified    Arthritis    RIGHT shoulder   Basal cell carcinoma    facial   Cataract    bilaterally sx   Chicken pox    CVA (cerebral vascular accident) (HCC) 2012   LEFT sided weakness   Depression    Diaphragmatic hernia without mention of obstruction or gangrene    Dysphagia, unspecified(787.20)    Erectile dysfunction    GERD (gastroesophageal reflux disease)    on meds- CVA affected LEFT side of esophagus   Herpes zoster without mention of complication    Hyperlipidemia    [employer]reesia 04/18/2020   Internal hemorrhoids without mention of complication    Measles    Mumps    x 2   Peripheral vision loss, left 2012   depth preception off as well in the LEFT eye   Personal history of colonic polyps    Pure hypercholesterolemia    Regurgitation    Seasonal allergies    Stroke ([facility]) 2012  Past Surgical History:  Procedure Laterality Date   basal cell carcinoma of anterior chest  2000   resection; [place]   cataract surgery  2010   with lens repacement Left   COLONOSCOPY  09/23/12   normal.  Symptoms: anemia, hemoccult+.  [facility].   ESOPHAGOGASTRODUODENOSCOPY  09/23/12   normal.  Symptoms: anemia, hemoccult +, GERD.   EYE SURGERY Bilateral    cataract   FRACTURE SURGERY Left    1957 left arm   small bowel mass  1991   removed part of small intestine benign mass   SMALL INTESTINE SURGERY      Social History    Socioeconomic History   Marital status: Married    Spouse name: [name]   Number of children: 1   Years of education: Not on file   Highest education level: Bachelor's degree (e.g., BA, AB, BS)  Occupational History   Occupation: Disability    Comment: CVA   Occupation: Previous Network engineer of [employer]  Tobacco Use   Smoking status: Former    Current packs/day: 2.00    Average packs/day: 2.0 packs/day for 25.0 years (50.0 ttl pk-yrs)    Types: Cigarettes   Smokeless tobacco: Never   Tobacco comments:    quit in 1984  Vaping Use   Vaping status: Never Used  Substance and Sexual Activity   Alcohol use: Yes    Alcohol/week: 4.0 standard drinks of alcohol    Types: 4 Cans of beer per week   Drug use: No   Sexual activity: Yes  Other Topics Concern   Not on file  Social History Narrative   Marital status:  Married x 43 years      Children: 1 daughte (45)r; 1 grandson (17)      Lives: with wife; has mountain house and [place] house at [employer]      Employment: disabled since CVA      Tobacco: none      Alcohol:  1-2 drinks per night      Drugs:  None      Exercise: very active in the yard. No formal exercise program in 2019.      ADLs: independent with ADLs; drives locally; maintains own yard.        Advanced Directives:  NONE in 2019.  FULL CODE; no prolonged measures.         Social Drivers of Corporate investment banker Strain: Low Risk  (03/27/2023)   Overall Financial Resource Strain (CARDIA)    Difficulty of Paying Living Expenses: Not hard at all  Food Insecurity: No Food Insecurity (03/27/2023)   Hunger Vital Sign    Worried About Running Out of Food in the Last Year: Never true    Ran Out of Food in the Last Year: Never true  Transportation Needs: No Transportation Needs (03/27/2023)   PRAPARE - Administrator, Civil Service (Medical): No    Lack of Transportation (Non-Medical): No  Physical Activity: Sufficiently Active (03/27/2023)   Exercise Vital  Sign    Days of Exercise per Week: 5 days    Minutes of Exercise per Session: 30 min  Stress: Stress Concern Present (03/27/2023)   [employer] of Occupational Health - Occupational Stress Questionnaire    Feeling of Stress : To some extent  Social Connections: Moderately Isolated (03/27/2023)   Social Connection and Isolation Panel [NHANES]    Frequency of Communication with Friends and Family: Three times a week    Frequency  of Social Gatherings with Friends and Family: Once a week    Attends Religious Services: Never    Database administrator or Organizations: No    Attends Banker Meetings: Never    Marital Status: Married  Catering manager Violence: Not At Risk (08/27/2022)   Humiliation, Afraid, Rape, and Kick questionnaire    Fear of Current or Ex-Partner: No    Emotionally Abused: No    Physically Abused: No    Sexually Abused: No    Family History  Problem Relation Age of Onset   Diabetes Mother    Depression Mother    Esophageal varices Mother    Hyperlipidemia Mother    Mental illness Mother    Liver disease Mother    Cancer Father        unknown   Hyperlipidemia Sister    Mental illness Sister        anxiety   Liver disease Sister    Hyperlipidemia Brother    Diabetes Brother    Arthritis Brother        Knee replacement   Bipolar disorder Brother    Liver disease Brother    Heart disease Brother 57       AMI/CAD with stenting   Colon polyps Neg Hx    Colon cancer Neg Hx    Esophageal cancer Neg Hx    Stomach cancer Neg Hx    Rectal cancer Neg Hx      Review of Systems  Constitutional: Negative.  Negative for chills and fever.  HENT: Negative.  Negative for congestion and sore throat.   Respiratory: Negative.  Negative for cough and shortness of breath.   Cardiovascular: Negative.  Negative for chest pain and palpitations.  Gastrointestinal:  Negative for abdominal pain, diarrhea, nausea and vomiting.  Genitourinary: Negative.   Negative for dysuria and hematuria.  Skin: Negative.  Negative for rash.  Neurological: Negative.  Negative for dizziness and headaches.  All other systems reviewed and are negative.   Vitals:   03/31/23 1437  BP: 138/78  Pulse: 63  Temp: 98.2 F (36.8 C)  SpO2: 98%    Physical Exam Vitals reviewed.  Constitutional:      Appearance: Normal appearance.  HENT:     Head: Normocephalic.  Eyes:     Extraocular Movements: Extraocular movements intact.     Conjunctiva/sclera: Conjunctivae normal.     Pupils: Pupils are equal, round, and reactive to light.  Cardiovascular:     Rate and Rhythm: Normal rate and regular rhythm.     Pulses: Normal pulses.     Heart sounds: Normal heart sounds.  Pulmonary:     Effort: Pulmonary effort is normal.     Breath sounds: Normal breath sounds.  Musculoskeletal:     Cervical back: No tenderness.  Lymphadenopathy:     Cervical: No cervical adenopathy.  Skin:    General: Skin is warm and dry.     Capillary Refill: Capillary refill takes less than 2 seconds.  Neurological:     Mental Status: He is alert and oriented to person, place, and time. Mental status is at baseline.  Psychiatric:        Mood and Affect: Mood normal.        Behavior: Behavior normal.      ASSESSMENT & PLAN: A total of 46 minutes was spent with the patient and counseling/coordination of care regarding preparing for this visit, review of most recent office visit notes, review of most  recent emergency department visit notes, review of most recent neurologist office visit notes, review of most recent brain imaging reports, review of multiple chronic medical conditions and their management, review of all medications, review of most recent bloodwork results, review of health maintenance items, education on nutrition, prognosis, documentation, and need for follow up.   Problem List Items Addressed This Visit       Digestive   Gastroesophageal reflux disease without  esophagitis   Stable and well-controlled. Continues omeprazole 40 mg daily        Other   Dyslipidemia   Eating better and staying physically active Diet and nutrition discussed Continue atorvastatin 80 mg daily      Prediabetes   Diet and nutrition discussed Cardiovascular risk associated with diabetes discussed        History of stroke   New onset seizure (HCC) - Primary   Likely triggered by increased drinking in setting of remote stroke No AED these at present time Normal recent EEG Alcohol cessation discussed No driving for at least 6 months from last seizure      Alcohol abuse   Other Visit Diagnoses       Hospital discharge follow-up          Patient Instructions  Seizure, Adult A seizure is a sudden burst of abnormal activity in the brain. Seizures usually last from 30 seconds to 2 minutes. There are many types of seizures. And they can cause many different symptoms. What are the causes? Common causes of a seizure include: Fever or infection. Problems that affect the brain. These may include: A brain or head injury. A stroke. A brain tumor. Low levels of blood sugar or salt. Kidney problems or liver problems. Some inherited conditions. These are passed down from parent to child. Problems with a substance, such as: Having a reaction to a drug or a medicine. Stopping the use of a substance all of a sudden. When this causes problems, it's called withdrawal. Disorders that affect how you develop, such as autism spectrum disorder or cerebral palsy. Sometimes, the cause may not be known. Some people who have a seizure never have another one. A person who has repeated seizures over time without a clear cause has a condition called epilepsy. What increases the risk? Having a family history of epilepsy. Having had a tonic-clonic seizure before. This type of seizure causes: The muscles of the whole body to tighten, or contract. Loss of consciousness. Having a  head injury or a stroke in the past. Having had too little oxygen at birth. What are the signs or symptoms? The symptoms vary depending on the type of seizure you have. Symptoms during a seizure Having convulsions. This means shaking with fast, jerky movements of muscles. Stiffness of the body. Breathing problems. Being confused. Staring or not responding to sound or touch. Head nodding, eye blinking, eye twitching, or fast eye movements. Drooling, grunting, or making clicking sounds with your mouth. Losing control of when you pee or poop. Symptoms before a seizure Feeling afraid, worried, or nervous. Feeling like you may vomit. Vertigo. This feels like: You are moving when you're not. Things around you are moving when they're not. Dj vu. This is a feeling of having seen or heard something before. Odd tastes or smells. Changes in how you see. You may see flashing lights or spots. Symptoms after a seizure Being confused. Feeling sleepy. Headache. Sore muscles. How is this diagnosed? A seizure may be diagnosed based on: A description  of your symptoms. Video of your seizures can be helpful. Your medical history. A physical exam. Tests, such as: Blood tests. CT scan. MRI. Electroencephalogram, or EEG. This test measures electrical activity in the brain. A test of your spinal fluid. This is called a spinal tap or lumbar puncture. How is this treated? If your seizure stops on its own, you will not need treatment. If your seizure lasts longer than 5 minutes, you'll normally need treatment. This may include: Medicines given through an IV. Avoiding things, such as medicines, that are known to cause your seizures. Medicines to prevent seizures. These are called antiepileptics. A device to prevent or control seizures. Eating foods that are low in carbohydrates and high in fat (ketogenic diet). Surgery. This is sometimes needed if you keep having seizures. Follow these  instructions at home: Medicines Take your medicines only as told by your health care provider. Avoid anything that may keep your medicine from working, such as alcohol. Activity Follow your provider's advice about driving, swimming, and doing other things that would be dangerous if you had a seizure. Wait until your provider says it's safe for you to do these things. If you live in the [place] ask your local department of motor vehicles [facility]) when you can drive. Get enough rest and sleep. Not getting enough sleep can make seizures more likely to happen. Teaching others  Teach friends and family what to do if you have a seizure. Tell them to: Help you get down to the ground safely. Protect your head and body. Loosen any clothing around your neck. Turn you on your side. This helps keep your airway clear if you vomit. Know whether or not you need emergency care. Stay with you until you are better. Also, tell them what not to do if you have a seizure. Tell them: They should not hold you down. They should not put anything in your mouth. General instructions Avoid anything that has caused you to have seizures. Keep a seizure diary. Write down: What you remember about each seizure. What you think might have caused each seizure. Keep all follow-up visits. Your provider may need to monitor your progress. Contact a health care provider if: You have another seizure or seizures. Call each time you have a seizure. You have a change in how often or when you have seizures. You keep having seizures with treatment. You have symptoms of being sick or having an infection. You are not able to take your medicine. Get help right away if: You have or someone has seen you have: A seizure that lasts longer than 5 minutes. Many seizures in a row and you don't feel better between seizures. A seizure that makes it harder to breathe. A seizure that leaves you unable to speak or use a part of your body. You  didn't wake up right away after a seizure. You injure yourself during a seizure. You have confusion or pain right after a seizure. These symptoms may be an emergency. Call 911 right away. Do not wait to see if the symptoms will go away. Do not drive yourself to the hospital. This information is not intended to replace advice given to you by your health care provider. Make sure you discuss any questions you have with your health care provider. Document Revised: 11/28/2022 Document Reviewed: 04/10/2022 Elsevier Patient Education  2024 Elsevier Inc.    Edwina Barth, MD [facility]

## 2023-03-31 NOTE — Patient Instructions (Signed)
Seizure, Adult A seizure is a sudden burst of abnormal activity in the brain. Seizures usually last from 30 seconds to 2 minutes. There are many types of seizures. And they can cause many different symptoms. What are the causes? Common causes of a seizure include: Fever or infection. Problems that affect the brain. These may include: A brain or head injury. A stroke. A brain tumor. Low levels of blood sugar or salt. Kidney problems or liver problems. Some inherited conditions. These are passed down from parent to child. Problems with a substance, such as: Having a reaction to a drug or a medicine. Stopping the use of a substance all of a sudden. When this causes problems, it's called withdrawal. Disorders that affect how you develop, such as autism spectrum disorder or cerebral palsy. Sometimes, the cause may not be known. Some people who have a seizure never have another one. A person who has repeated seizures over time without a clear cause has a condition called epilepsy. What increases the risk? Having a family history of epilepsy. Having had a tonic-clonic seizure before. This type of seizure causes: The muscles of the whole body to tighten, or contract. Loss of consciousness. Having a head injury or a stroke in the past. Having had too little oxygen at birth. What are the signs or symptoms? The symptoms vary depending on the type of seizure you have. Symptoms during a seizure Having convulsions. This means shaking with fast, jerky movements of muscles. Stiffness of the body. Breathing problems. Being confused. Staring or not responding to sound or touch. Head nodding, eye blinking, eye twitching, or fast eye movements. Drooling, grunting, or making clicking sounds with your mouth. Losing control of when you pee or poop. Symptoms before a seizure Feeling afraid, worried, or nervous. Feeling like you may vomit. Vertigo. This feels like: You are moving when you're  not. Things around you are moving when they're not. Dj vu. This is a feeling of having seen or heard something before. Odd tastes or smells. Changes in how you see. You may see flashing lights or spots. Symptoms after a seizure Being confused. Feeling sleepy. Headache. Sore muscles. How is this diagnosed? A seizure may be diagnosed based on: A description of your symptoms. Video of your seizures can be helpful. Your medical history. A physical exam. Tests, such as: Blood tests. CT scan. MRI. Electroencephalogram, or EEG. This test measures electrical activity in the brain. A test of your spinal fluid. This is called a spinal tap or lumbar puncture. How is this treated? If your seizure stops on its own, you will not need treatment. If your seizure lasts longer than 5 minutes, you'll normally need treatment. This may include: Medicines given through an IV. Avoiding things, such as medicines, that are known to cause your seizures. Medicines to prevent seizures. These are called antiepileptics. A device to prevent or control seizures. Eating foods that are low in carbohydrates and high in fat (ketogenic diet). Surgery. This is sometimes needed if you keep having seizures. Follow these instructions at home: Medicines Take your medicines only as told by your health care provider. Avoid anything that may keep your medicine from working, such as alcohol. Activity Follow your provider's advice about driving, swimming, and doing other things that would be dangerous if you had a seizure. Wait until your provider says it's safe for you to do these things. If you live in the [place] ask your local department of motor vehicles [facility]) when you can drive. Get  enough rest and sleep. Not getting enough sleep can make seizures more likely to happen. Teaching others  Teach friends and family what to do if you have a seizure. Tell them to: Help you get down to the ground safely. Protect your head  and body. Loosen any clothing around your neck. Turn you on your side. This helps keep your airway clear if you vomit. Know whether or not you need emergency care. Stay with you until you are better. Also, tell them what not to do if you have a seizure. Tell them: They should not hold you down. They should not put anything in your mouth. General instructions Avoid anything that has caused you to have seizures. Keep a seizure diary. Write down: What you remember about each seizure. What you think might have caused each seizure. Keep all follow-up visits. Your provider may need to monitor your progress. Contact a health care provider if: You have another seizure or seizures. Call each time you have a seizure. You have a change in how often or when you have seizures. You keep having seizures with treatment. You have symptoms of being sick or having an infection. You are not able to take your medicine. Get help right away if: You have or someone has seen you have: A seizure that lasts longer than 5 minutes. Many seizures in a row and you don't feel better between seizures. A seizure that makes it harder to breathe. A seizure that leaves you unable to speak or use a part of your body. You didn't wake up right away after a seizure. You injure yourself during a seizure. You have confusion or pain right after a seizure. These symptoms may be an emergency. Call 911 right away. Do not wait to see if the symptoms will go away. Do not drive yourself to the hospital. This information is not intended to replace advice given to you by your health care provider. Make sure you discuss any questions you have with your health care provider. Document Revised: 11/28/2022 Document Reviewed: 04/10/2022 Elsevier Patient Education  2024 [employer].

## 2023-03-31 NOTE — Assessment & Plan Note (Signed)
Eating better and staying physically active Diet and nutrition discussed Continue atorvastatin 80 mg daily

## 2023-03-31 NOTE — Assessment & Plan Note (Signed)
Stable and well-controlled. Continues omeprazole 40 mg daily

## 2023-03-31 NOTE — Assessment & Plan Note (Signed)
Likely triggered by increased drinking in setting of remote stroke No AED these at present time Normal recent EEG Alcohol cessation discussed No driving for at least 6 months from last seizure

## 2023-03-31 NOTE — Assessment & Plan Note (Signed)
Diet and nutrition discussed. Cardiovascular risk associated with diabetes discussed.

## 2023-04-07 ENCOUNTER — Encounter: Payer: Self-pay | Admitting: Emergency Medicine

## 2023-04-08 ENCOUNTER — Ambulatory Visit: Payer: PPO | Admitting: Neurology

## 2023-04-08 ENCOUNTER — Ambulatory Visit: Payer: PPO | Admitting: Emergency Medicine

## 2023-04-29 DIAGNOSIS — D2271 Melanocytic nevi of right lower limb, including hip: Secondary | ICD-10-CM | POA: Diagnosis not present

## 2023-04-29 DIAGNOSIS — Z85828 Personal history of other malignant neoplasm of skin: Secondary | ICD-10-CM | POA: Diagnosis not present

## 2023-04-29 DIAGNOSIS — D2261 Melanocytic nevi of right upper limb, including shoulder: Secondary | ICD-10-CM | POA: Diagnosis not present

## 2023-04-29 DIAGNOSIS — L570 Actinic keratosis: Secondary | ICD-10-CM | POA: Diagnosis not present

## 2023-04-29 DIAGNOSIS — D2262 Melanocytic nevi of left upper limb, including shoulder: Secondary | ICD-10-CM | POA: Diagnosis not present

## 2023-04-29 DIAGNOSIS — D2272 Melanocytic nevi of left lower limb, including hip: Secondary | ICD-10-CM | POA: Diagnosis not present

## 2023-04-29 DIAGNOSIS — L821 Other seborrheic keratosis: Secondary | ICD-10-CM | POA: Diagnosis not present

## 2023-04-29 DIAGNOSIS — D225 Melanocytic nevi of trunk: Secondary | ICD-10-CM | POA: Diagnosis not present

## 2023-04-29 DIAGNOSIS — Z08 Encounter for follow-up examination after completed treatment for malignant neoplasm: Secondary | ICD-10-CM | POA: Diagnosis not present

## 2023-06-20 NOTE — Progress Notes (Deleted)
 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 Alcohol abuse    Will check EEG.  If positive for epileptiform discharges, will start AED.  Otherwise, will hold off starting AED and advised to refrain from drinking. Discussed [place] law stating no driving for 6 months from last seizure.  However, given his homonymous hemianopsia, I recommended that he follow up with DMV or ophthalmology to see if there is any contraindication to drive. 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  UPDATE: Awake and drowsy routine EEG on 03/26/2023 was normal. Therefore, held off on initiating an AED. *** Ophtalmology?  *** Alcohol? ***  HISTORY: On the evening of 03/18/2023, he was watching TV in bed with his wife when she turned to him and saw that he was exhibiting generalized shaking with eyes rolled back, foaming at mouth and unresponsive.  This lasted a few minutes  He was confused afterwards.  No tongue biting or incontinence.  He did state that he had to go to the bathroom which he did.  He has no recollection of anything until he was in the ambulance.  He was seen that night in the ED at [facility].  CT and follow up MRI of brain revealed large chronic right PCA territory infarct including involvement of the right hippocampal formation but no acute findings.  Blood pressure was elevated in the 150s-160s systolic.  Labs revealed elevated venous lactic acid of 2.9 but otherwise unremarkable, including CBC, CMP, troponin, UA, UDS and ethanol.  ECG unremarkable.  He had some dizziness and headache afterward.  He told his wife that evening that he wasn't feeling well.  Since his stroke in 2012, he has binge  drinking off and on.  He typically will drink beer.  However, this Christmas, he was drinking hard liquor including bourbon, rum and tequila.  That evening, he had a strong cup of coffee with rum.  No recent illness, head trauma or change in medication.    Of note, he has not had any recent SUNCT headaches.  03/18/2023 CT HEAD:  1. No acute intracranial CT findings or interval changes. 2. Chronic right occipitotemporal infarct with extension to the dorsal right thalamus. 3. Chronic lacunar infarct in the body of the right caudate nucleus. 4. Atrophy and small-vessel disease. 5. Sinus membrane disease. 03/19/2023 MRI BRAIN W WO:  1. No acute intracranial abnormality. 2. 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 3. Additional chronic small vessel disease, not significantly changed from 2023.  PAST MEDICAL HISTORY: Past Medical History:  Diagnosis Date   Allergic rhinitis, cause unspecified    Anxiety state, unspecified    Arthritis    RIGHT shoulder   Basal cell carcinoma    facial   Cataract    bilaterally sx   Chicken pox    CVA (cerebral vascular accident) (HCC) 2012   LEFT sided weakness   Depression    Diaphragmatic hernia without mention of obstruction or gangrene    Dysphagia, unspecified(787.20)    Erectile dysfunction    GERD (gastroesophageal reflux disease)    on meds- CVA affected LEFT side of esophagus   Herpes zoster without mention of complication  Hyperlipidemia    [employer]reesia 04/18/2020   Internal hemorrhoids without mention of complication    Measles    Mumps    x 2   Peripheral vision loss, left 2012   depth preception off as well in the LEFT eye   Personal history of colonic polyps    Pure hypercholesterolemia    Regurgitation    Seasonal allergies    Stroke [facility]) 2012    MEDICATIONS: Current Outpatient Medications on File Prior to Visit  Medication Sig Dispense Refill    Acetaminophen (TYLENOL PO) Take by mouth as needed.     ASPIRIN 81 PO Take 162 mg by mouth daily.     atorvastatin (LIPITOR) 80 MG tablet TAKE 1 TABLET BY MOUTH DAILY 90 tablet 3   loratadine (CLARITIN) 10 MG tablet Take 10 mg by mouth daily.     Multiple Vitamin (MULTIVITAMINS PO) Take 0.5 tablets by mouth daily.     omeprazole (PRILOSEC) 40 MG capsule Take 1 capsule by mouth daily 90 capsule 1   No current facility-administered medications on file prior to visit.    ALLERGIES: Allergies  Allergen Reactions   Zyrtec D [Cetirizine-Pseudoephedrine Er]     "talking out of his head"    FAMILY HISTORY: Family History  Problem Relation Age of Onset   Diabetes Mother    Depression Mother    Esophageal varices Mother    Hyperlipidemia Mother    Mental illness Mother    Liver disease Mother    Cancer Father        unknown   Hyperlipidemia Sister    Mental illness Sister        anxiety   Liver disease Sister    Hyperlipidemia Brother    Diabetes Brother    Arthritis Brother        Knee replacement   Bipolar disorder Brother    Liver disease Brother    Heart disease Brother 68       AMI/CAD with stenting   Colon polyps Neg Hx    Colon cancer Neg Hx    Esophageal cancer Neg Hx    Stomach cancer Neg Hx    Rectal cancer Neg Hx       Objective:  *** General: No acute distress.  Patient appears well-groomed.   ***   Shon Millet, DO  CC: Georgina Quint, MD

## 2023-06-22 ENCOUNTER — Encounter: Payer: Self-pay | Admitting: Neurology

## 2023-06-23 ENCOUNTER — Ambulatory Visit: Payer: PPO | Admitting: Neurology

## 2023-07-29 ENCOUNTER — Other Ambulatory Visit: Payer: Self-pay | Admitting: Emergency Medicine

## 2023-07-29 ENCOUNTER — Encounter: Payer: Self-pay | Admitting: Emergency Medicine

## 2023-07-29 MED ORDER — AMOXICILLIN-POT CLAVULANATE 875-125 MG PO TABS
1.0000 | ORAL_TABLET | Freq: Two times a day (BID) | ORAL | 0 refills | Status: AC
Start: 2023-07-29 — End: 2023-08-05

## 2023-07-29 NOTE — Telephone Encounter (Signed)
 LVM for patient to call [place] Vedia Geralds to send the rx to

## 2023-07-29 NOTE — Telephone Encounter (Signed)
 Call patient and find out what pharmacy he wants me to use

## 2023-07-29 NOTE — Telephone Encounter (Signed)
Prescription for Augmentin sent to pharmacy requested today.  Thanks.

## 2023-08-06 NOTE — Telephone Encounter (Signed)
 Recommend to continue monitoring the symptoms.  Do not advise second round of antibiotics at this time.

## 2023-08-07 ENCOUNTER — Ambulatory Visit: Payer: PPO | Admitting: Neurology

## 2023-08-14 NOTE — Progress Notes (Unsigned)
 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 SUNCT    For headache prevention:  Start oxcarbazepine 150mg  twice daily.  Side effects discussed.  We can increase to 300mg  twice daily if needed.  Check CBC and BMP today.  Repeat BMP (to monitor Na) in 6 months.  He is also having routine labs performed in late July with his PCP. We are starting oxcarbazepine for headache.  Otherwise, ass this was an isolated provoked seizure with normal EEG, I would have defer redstarting AED. Continue alcohol cessation  Follow up 6 months.  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  UPDATE: Isolated seizure: Routine awake and drowsy EEG on 03/26/2023 was normal.  No recurrence.  He is no longer drinking any alcohol.  SUNCT: Previously he had side effects to lamotrigine  and topiramate .  However, headaches subsequently resolved.  They have since returned, occurring several times a day.  HISTORY: New-onset seizure: On the evening of 03/18/2023, he was watching TV in bed with his wife when she turned to him and saw that he was exhibiting generalized shaking with eyes rolled back, foaming at mouth and unresponsive.  This lasted a few minutes  He was confused afterwards.  No tongue biting or incontinence.  He did state that he had to go to the bathroom which he did.  He has no recollection of anything until he was in the ambulance.  He was seen that night in the ED at [facility].  CT and follow up MRI of brain revealed large chronic right PCA territory infarct including involvement of the right hippocampal formation but no acute findings.  Blood pressure was elevated in the 150s-160s systolic.  Labs revealed elevated venous lactic  acid of 2.9 but otherwise unremarkable, including CBC, CMP, troponin, UA, UDS and ethanol.  ECG unremarkable.  He had some dizziness and headache afterward.  He told his wife that evening that he wasn't feeling well.  Since his stroke in 2012, he has binge drinking off and on.  He typically will drink beer.  However, this Christmas, he was drinking hard liquor including bourbon, rum and tequila.  That evening, he had a strong cup of coffee with rum.  No recent illness, head trauma or change in medication.    SUNCT: He began having intermittent facial pain since around 2005.  He describes a left retroorbital pain radiating to the upper teeth on the left.  It is a 10/10 paroxysmal sharp pain lasting several seconds.  There is associated left ptosis, conjunctival injection, lacrimation, nasal congestion and left cheek swelling.  Left eye appears "sunken".  It is triggered by touching face, chewing, washing face and brushing teeth.  It tends to not be spontaneous.  He may have several months during the year where he no longer has the pain.  However, it is unpredictable when it occurs.   In 2015, he had a CT of the sinuses which showed mild bilateral maxillary sinus disease and was treated with antibiotics for presumed sinus infection.  However pain continued.    Past medications:  lamotrigine  (rash), topiramate  (adverse effects)  Imaging: 03/18/2023 CT HEAD:  1. No acute intracranial CT findings or interval changes. 2. Chronic right occipitotemporal infarct with extension  to the dorsal right thalamus. 3. Chronic lacunar infarct in the body of the right caudate nucleus. 4. Atrophy and small-vessel disease. 5. Sinus membrane disease. 03/19/2023 MRI BRAIN W WO:  1. No acute intracranial abnormality. 2. 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 3. Additional chronic small vessel disease, not significantly changed from 2023.  PAST  MEDICAL HISTORY: Past Medical History:  Diagnosis Date   Allergic rhinitis, cause unspecified    Anxiety state, unspecified    Arthritis    RIGHT shoulder   Basal cell carcinoma    facial   Cataract    bilaterally sx   Chicken pox    CVA (cerebral vascular accident) (HCC) 2012   LEFT sided weakness   Depression    Diaphragmatic hernia without mention of obstruction or gangrene    Dysphagia, unspecified(787.20)    Erectile dysfunction    GERD (gastroesophageal reflux disease)    on meds- CVA affected LEFT side of esophagus   Herpes zoster without mention of complication    Hyperlipidemia    Phreesia 04/18/2020   Internal hemorrhoids without mention of complication    Measles    Mumps    x 2   Peripheral vision loss, left 2012   depth preception off as well in the LEFT eye   Personal history of colonic polyps    Pure hypercholesterolemia    Regurgitation    Seasonal allergies    Stroke [facility]) 2012    MEDICATIONS: Current Outpatient Medications on File Prior to Visit  Medication Sig Dispense Refill   Acetaminophen  (TYLENOL  PO) Take by mouth as needed.     ASPIRIN 81 PO Take 162 mg by mouth daily.     atorvastatin  (LIPITOR) 80 MG tablet TAKE 1 TABLET BY MOUTH DAILY 90 tablet 3   loratadine (CLARITIN) 10 MG tablet Take 10 mg by mouth daily.     Multiple Vitamin (MULTIVITAMINS PO) Take 0.5 tablets by mouth daily.     omeprazole  (PRILOSEC) 40 MG capsule Take 1 capsule by mouth daily 90 capsule 1   No current facility-administered medications on file prior to visit.    ALLERGIES: Allergies  Allergen Reactions   Zyrtec D [Cetirizine-Pseudoephedrine Er]     "talking out of his head"    FAMILY HISTORY: Family History  Problem Relation Age of Onset   Diabetes Mother    Depression Mother    Esophageal varices Mother    Hyperlipidemia Mother    Mental illness Mother    Liver disease Mother    Cancer Father        unknown   Hyperlipidemia Sister    Mental illness  Sister        anxiety   Liver disease Sister    Hyperlipidemia Brother    Diabetes Brother    Arthritis Brother        Knee replacement   Bipolar disorder Brother    Liver disease Brother    Heart disease Brother 64       AMI/CAD with stenting   Colon polyps Neg Hx    Colon cancer Neg Hx    Esophageal cancer Neg Hx    Stomach cancer Neg Hx    Rectal cancer Neg Hx       Objective:  Blood pressure 129/70, pulse 69, height 6' (1.829 m), weight 193 lb (87.5 kg), SpO2 97%.  General: No acute distress.  Patient appears well-groomed.      Janne Members, DO  CC: Elvira Hammersmith, MD

## 2023-08-15 ENCOUNTER — Encounter: Payer: Self-pay | Admitting: Neurology

## 2023-08-15 ENCOUNTER — Other Ambulatory Visit

## 2023-08-15 ENCOUNTER — Ambulatory Visit: Admitting: Neurology

## 2023-08-15 VITALS — BP 129/70 | HR 69 | Ht 72.0 in | Wt 193.0 lb

## 2023-08-15 DIAGNOSIS — Z8673 Personal history of transient ischemic attack (TIA), and cerebral infarction without residual deficits: Secondary | ICD-10-CM | POA: Diagnosis not present

## 2023-08-15 DIAGNOSIS — Z79899 Other long term (current) drug therapy: Secondary | ICD-10-CM

## 2023-08-15 DIAGNOSIS — R569 Unspecified convulsions: Secondary | ICD-10-CM

## 2023-08-15 DIAGNOSIS — G44059 Short lasting unilateral neuralgiform headache with conjunctival injection and tearing (SUNCT), not intractable: Secondary | ICD-10-CM | POA: Diagnosis not present

## 2023-08-15 MED ORDER — OXCARBAZEPINE 150 MG PO TABS: 150.0000 mg | ORAL_TABLET | Freq: Two times a day (BID) | ORAL | 5 refills | Status: DC

## 2023-08-15 NOTE — Patient Instructions (Signed)
 Start oxcarbazepine 150mg  twice daily.  If headaches don't improve in 4 weeks, contact me. Check CBC and BMP today.  Repeat BMP in 6 months.  Still get labs drawn with your PCP at end of July. Follow up 6 months.

## 2023-08-16 LAB — CBC
HCT: 41.1 % (ref 38.5–50.0)
Hemoglobin: 13.6 g/dL (ref 13.2–17.1)
MCH: 31.8 pg (ref 27.0–33.0)
MCHC: 33.1 g/dL (ref 32.0–36.0)
MCV: 96 fL (ref 80.0–100.0)
MPV: 11.3 fL (ref 7.5–12.5)
Platelets: 233 10*3/uL (ref 140–400)
RBC: 4.28 10*6/uL (ref 4.20–5.80)
RDW: 12.1 % (ref 11.0–15.0)
WBC: 7.1 10*3/uL (ref 3.8–10.8)

## 2023-08-16 LAB — BASIC METABOLIC PANEL WITH GFR
BUN: 11 mg/dL (ref 7–25)
CO2: 26 mmol/L (ref 20–32)
Calcium: 9.6 mg/dL (ref 8.6–10.3)
Chloride: 105 mmol/L (ref 98–110)
Creat: 0.89 mg/dL (ref 0.70–1.28)
Glucose, Bld: 99 mg/dL (ref 65–99)
Potassium: 4.5 mmol/L (ref 3.5–5.3)
Sodium: 139 mmol/L (ref 135–146)
eGFR: 89 mL/min/{1.73_m2} (ref >=60)

## 2023-08-26 ENCOUNTER — Encounter: Payer: Self-pay | Admitting: Emergency Medicine

## 2023-08-27 ENCOUNTER — Other Ambulatory Visit: Payer: Self-pay | Admitting: Radiology

## 2023-08-27 DIAGNOSIS — K219 Gastro-esophageal reflux disease without esophagitis: Secondary | ICD-10-CM

## 2023-08-27 MED ORDER — OMEPRAZOLE 40 MG PO CPDR: DELAYED_RELEASE_CAPSULE | ORAL | 1 refills | Status: DC

## 2023-09-02 ENCOUNTER — Ambulatory Visit (INDEPENDENT_AMBULATORY_CARE_PROVIDER_SITE_OTHER): Payer: PPO

## 2023-09-02 VITALS — Ht 72.0 in | Wt 193.0 lb

## 2023-09-02 DIAGNOSIS — Z0000 Encounter for general adult medical examination without abnormal findings: Secondary | ICD-10-CM | POA: Diagnosis not present

## 2023-09-02 NOTE — Patient Instructions (Addendum)
 [name] , Thank you for taking time out of your busy schedule to complete your Annual Wellness Visit with me. I enjoyed our conversation and look forward to speaking with you again next year. I, as well as your care team,  appreciate your ongoing commitment to your health goals. Please review the following plan we discussed and let me know if I can assist you in the future. Your Game plan/ To Do List   Follow up Visits: Next Medicare AWV with our clinical staff: 09/02/2024   Have you seen your provider in the last 6 months (3 months if uncontrolled diabetes)? Yes Next Office Visit with your provider: 10/08/2023  Clinician Recommendations:  Aim for 30 minutes of exercise or brisk walking, 6-8 glasses of water, and 5 servings of fruits and vegetables each day.       This is a list of the screening recommended for you and due dates:  Health Maintenance  Topic Date Due   Flu Shot  10/10/2023   Medicare Annual Wellness Visit  09/01/2024   Colon Cancer Screening  11/26/2029   DTaP/Tdap/Td vaccine (3 - Td or Tdap) 01/08/2032   Pneumococcal Vaccine for age over 12  Completed   Hepatitis C Screening  Completed   Zoster (Shingles) Vaccine  Completed   Hepatitis B Vaccine  Aged Out   HPV Vaccine  Aged Out   Meningitis B Vaccine  Aged Out   COVID-19 Vaccine  Discontinued    Advanced directives: (Copy Requested) Please bring a copy of your health care power of attorney and living will to the office to be added to your chart at your convenience. You can mail to [facility] [address] [place], [place] [address] or email to [email or web address] Advance Care Planning is important because it:  [x]  Makes sure you receive the medical care that is consistent with your values, goals, and preferences  [x]  It provides guidance to your family and loved ones and reduces their decisional burden about whether or not they are making the right decisions based on your  wishes.  Follow the link provided in your after visit summary or read over the paperwork we have mailed to you to help you started getting your Advance Directives in place. If you need assistance in completing these, please reach out to us  so that we can help you!

## 2023-09-02 NOTE — Progress Notes (Signed)
 me  Subjective:   Tyrone Curtis is a 73 y.o. who presents for a Medicare Wellness preventive visit.  As a reminder, Annual Wellness Visits don't include a physical exam, and some assessments may be limited, especially if this visit is performed virtually. We may recommend an in-person follow-up visit with your provider if needed.  Visit Complete: Virtual I connected with  Tyrone Curtis on 09/02/23 by a audio enabled telemedicine application and verified that I am speaking with the correct person using two identifiers.  Patient Location: Home  Provider Location: Office/Clinic  I discussed the limitations of evaluation and management by telemedicine. The patient expressed understanding and agreed to proceed.  Vital Signs: Because this visit was a virtual/telehealth visit, some criteria may be missing or patient reported. Any vitals not documented were not able to be obtained and vitals that have been documented are patient reported.  VideoDeclined- This patient declined Librarian, academic. Therefore the visit was completed with audio only.  Persons Participating in Visit: Patient.  AWV Questionnaire: Yes: Patient Medicare AWV questionnaire was completed by the patient on 08/31/2023; I have confirmed that all information answered by patient is correct and no changes since this date.  Cardiac Risk Factors include: advanced age (>65men, >41 women);dyslipidemia;male gender     Objective:    Today's Vitals   09/02/23 0953  Weight: 193 lb (87.5 kg)  Height: 6' (1.829 m)   Body mass index is 26.18 kg/m.     09/02/2023    9:53 AM 08/15/2023    9:59 AM 08/27/2022    9:43 AM 01/07/2022   11:02 AM 12/04/2020   11:27 AM 08/11/2019    8:09 AM 05/17/2019    7:34 PM  Advanced Directives  Does Patient Have a Medical Advance Directive? Yes Yes Yes Yes Yes Yes Yes  Type of Estate agent of [place];Living will Healthcare Power of  [place];Living will Healthcare Power of [place];Living will Healthcare Power of [place];Living will Living will;Healthcare Power of [employer] Power of [place];Living will  Does patient want to make changes to medical advance directive?     No - Patient declined No - Patient declined   Copy of Healthcare Power of Attorney in Chart? No - copy requested No - copy requested No - copy requested No - copy requested No - copy requested  No - copy requested  Would patient like information on creating a medical advance directive?       No - Patient declined    Current Medications (verified) Outpatient Encounter Medications as of 09/02/2023  Medication Sig   Acetaminophen  (TYLENOL  PO) Take by mouth as needed.   ASPIRIN 81 PO Take 162 mg by mouth daily.   atorvastatin  (LIPITOR) 80 MG tablet TAKE 1 TABLET BY MOUTH DAILY   loratadine (CLARITIN) 10 MG tablet Take 10 mg by mouth daily.   Multiple Vitamin (MULTIVITAMINS PO) Take 0.5 tablets by mouth daily.   omeprazole  (PRILOSEC) 40 MG capsule Take 1 capsule by mouth daily   OXcarbazepine  (TRILEPTAL ) 150 MG tablet Take 1 tablet (150 mg total) by mouth 2 (two) times daily.   No facility-administered encounter medications on file as of 09/02/2023.    Allergies (verified) Zyrtec d [cetirizine-pseudoephedrine er]   History: Past Medical History:  Diagnosis Date   Allergic rhinitis, cause unspecified    Anxiety state, unspecified    Arthritis    RIGHT shoulder   Basal cell carcinoma    facial   Cataract  bilaterally sx   Chicken pox    CVA (cerebral vascular accident) (HCC) 2012   LEFT sided weakness   Depression    Diaphragmatic hernia without mention of obstruction or gangrene    Dysphagia, unspecified(787.20)    Erectile dysfunction    GERD (gastroesophageal reflux disease)    on meds- CVA affected LEFT side of esophagus   Herpes zoster without mention of complication    Hyperlipidemia    [employer] 04/18/2020   Internal  hemorrhoids without mention of complication    Measles    Mumps    x 2   Peripheral vision loss, left 2012   depth preception off as well in the LEFT eye   Personal history of colonic polyps    Pure hypercholesterolemia    Regurgitation    Seasonal allergies    Stroke (HCC) 2012   Past Surgical History:  Procedure Laterality Date   basal cell carcinoma of anterior chest  2000   resection; [place]   cataract surgery  2010   with lens repacement Left   COLONOSCOPY  09/23/12   normal.  Symptoms: anemia, hemoccult+.  [facility].   ESOPHAGOGASTRODUODENOSCOPY  09/23/12   normal.  Symptoms: anemia, hemoccult +, GERD.   EYE SURGERY Bilateral    cataract   FRACTURE SURGERY Left    1957 left arm   small bowel mass  1991   removed part of small intestine benign mass   SMALL INTESTINE SURGERY     Family History  Problem Relation Age of Onset   Diabetes Mother    Depression Mother    Esophageal varices Mother    Hyperlipidemia Mother    Mental illness Mother    Liver disease Mother    Cancer Father        unknown   Hyperlipidemia Sister    Mental illness Sister        anxiety   Liver disease Sister    Hyperlipidemia Brother    Diabetes Brother    Arthritis Brother        Knee replacement   Bipolar disorder Brother    Liver disease Brother    Heart disease Brother 4       AMI/CAD with stenting   Colon polyps Neg Hx    Colon cancer Neg Hx    Esophageal cancer Neg Hx    Stomach cancer Neg Hx    Rectal cancer Neg Hx    Social History   Socioeconomic History   Marital status: Married    Spouse name: Darice   Number of children: 1   Years of education: Not on file   Highest education level: Bachelor's degree (e.g., BA, AB, BS)  Occupational History   Occupation: Disability    Comment: CVA   Occupation: Previous Network engineer of [employer]  Tobacco Use   Smoking status: Former    Current packs/day: 0.00    Average packs/day: 1 pack/day for 18.0 years (18.0 ttl pk-yrs)     Types: Cigarettes    Start date: 03/11/1964    Quit date: 03/11/1982    Years since quitting: 41.5   Smokeless tobacco: Never   Tobacco comments:    quit in 1984  Vaping Use   Vaping status: Never Used  Substance and Sexual Activity   Alcohol use: Not Currently   Drug use: No   Sexual activity: Yes  Other Topics Concern   Not on file  Social History Narrative   Marital status:  Married x 43 years  Children: 1 daughte (45)r; 1 grandson (37)      Lives: with wife; has mountain house and [place] house at [employer]      Employment: disabled since CVA      Tobacco: none      Alcohol:  1-2 drinks per night      Drugs:  None      Exercise: very active in the yard. No formal exercise program in 2019.      ADLs: independent with ADLs; drives locally; maintains own yard.        Advanced Directives:  NONE in 2019.  FULL CODE; no prolonged measures.         Social Drivers of Corporate investment banker Strain: Low Risk  (09/02/2023)   Overall Financial Resource Strain (CARDIA)    Difficulty of Paying Living Expenses: Not hard at all  Food Insecurity: No Food Insecurity (09/02/2023)   Hunger Vital Sign    Worried About Running Out of Food in the Last Year: Never true    Ran Out of Food in the Last Year: Never true  Transportation Needs: No Transportation Needs (09/02/2023)   PRAPARE - Administrator, Civil Service (Medical): No    Lack of Transportation (Non-Medical): No  Physical Activity: Sufficiently Active (09/02/2023)   Exercise Vital Sign    Days of Exercise per Week: 6 days    Minutes of Exercise per Session: 150+ min  Stress: Stress Concern Present (09/02/2023)   [employer] of Occupational Health - Occupational Stress Questionnaire    Feeling of Stress: To some extent  Social Connections: Moderately Isolated (09/02/2023)   Social Connection and Isolation Panel    Frequency of Communication with Friends and Family: Three times a week    Frequency of  Social Gatherings with Friends and Family: Patient declined    Attends Religious Services: Patient declined    Database administrator or Organizations: No    Attends Engineer, structural: Never    Marital Status: Married    Tobacco Counseling Counseling given: No Tobacco comments: quit in 1984    Clinical Intake:  Pre-visit preparation completed: Yes  Pain : No/denies pain     BMI - recorded: 26.18 Nutritional Status: BMI 25 -29 Overweight Nutritional Risks: None Diabetes: No  Lab Results  Component Value Date   HGBA1C 6.0 10/02/2022   HGBA1C 6.1 04/01/2022   HGBA1C 6.0 09/26/2021     How often do you need to have someone help you when you read instructions, pamphlets, or other written materials from your doctor or pharmacy?: 1 - Never  Interpreter Needed?: No  Information entered by :: Verdie Saba, CMA   Activities of Daily Living     09/02/2023    9:56 AM  In your present state of health, do you have any difficulty performing the following activities:  Hearing? 0  Vision? 0  Difficulty concentrating or making decisions? 0  Walking or climbing stairs? 0  Dressing or bathing? 0  Doing errands, shopping? 0  Preparing Food and eating ? N  Using the Toilet? N  In the past six months, have you accidently leaked urine? N  Do you have problems with loss of bowel control? N  Managing your Medications? N  Managing your Finances? N  Housekeeping or managing your Housekeeping? N    Patient Care Team: Purcell Emil Schanz, MD as PCP - General (Internal Medicine) Perla Evalene PARAS, MD as Consulting Physician (Cardiology) Dasher, Alm LABOR,  MD (Dermatology) Maree Jannett POUR, MD as Consulting Physician (Neurology) Skeet Juliene SAUNDERS, DO as Consulting Physician (Neurology) Laurice Francis NOVAK, OD as Consulting Physician (Optometry)  I have updated your Care Teams any recent Medical Services you may have received from other providers in the past year.      Assessment:   This is a routine wellness examination for [place].  Hearing/Vision screen Hearing Screening - Comments:: Denies hearing difficulties   Vision Screening - Comments:: Denies vision concerns - up-to-date appt w/Dr Nice   Goals Addressed               This Visit's Progress     Patient Stated (pt-stated)        Patient stated he plans to continue exercise and lose weight - lose about 10lbs       Depression Screen     09/02/2023    9:57 AM 03/31/2023    2:41 PM 10/02/2022    8:34 AM 08/27/2022    9:52 AM 04/01/2022    8:25 AM 01/07/2022   10:33 AM 03/27/2021   11:37 AM  PHQ 2/9 Scores  PHQ - 2 Score 0 2 0 0 0 0 0  PHQ- 9 Score  3  0       Fall Risk     09/02/2023    9:57 AM 08/15/2023    9:59 AM 03/31/2023    2:41 PM 10/02/2022    8:34 AM 08/27/2022    9:43 AM  Fall Risk   Falls in the past year? 0 0 0 0 0  Number falls in past yr: 0 0 0 0 0  Injury with Fall? 0 0 0 0 0  Risk for fall due to : No Fall Risks  No Fall Risks No Fall Risks No Fall Risks  Follow up Falls evaluation completed;Falls prevention discussed Falls evaluation completed Falls evaluation completed Falls evaluation completed Falls prevention discussed    MEDICARE RISK AT HOME:  Medicare Risk at Home Any stairs in or around the home?: Yes If so, are there any without handrails?: Yes Home free of loose throw rugs in walkways, pet beds, electrical cords, etc?: Yes Adequate lighting in your home to reduce risk of falls?: Yes Life alert?: No Use of a cane, walker or w/c?: No Grab bars in the bathroom?: Yes Shower chair or bench in shower?: Yes Elevated toilet seat or a handicapped toilet?: Yes  TIMED UP AND GO:  Was the test performed?  No  Cognitive Function: 6CIT completed        09/02/2023    9:59 AM 08/27/2022    9:44 AM 01/07/2022   11:05 AM 08/11/2019    8:06 AM 12/17/2018   11:20 AM  6CIT Screen  What Year? 0 points 0 points 0 points 0 points 0 points  What month? 0 points 0  points 0 points 0 points 0 points  What time? 0 points 0 points 0 points 0 points 0 points  Count back from 20 0 points 0 points 0 points 0 points 0 points  Months in reverse 0 points 0 points 0 points 0 points 0 points  Repeat phrase 0 points 0 points 0 points 0 points 0 points  Total Score 0 points 0 points 0 points 0 points 0 points    Immunizations Immunization History  Administered Date(s) Administered   Fluad Quad(high Dose 65+) 12/08/2018, 01/07/2022, 12/20/2022   Influenza Split 01/02/2007, 02/06/2009, 04/03/2011, 12/16/2011   Influenza, High Dose Seasonal PF 01/22/2018,  12/11/2019, 12/13/2019   Influenza,inj,Quad PF,6+ Mos 01/03/2014, 01/09/2015, 01/16/2016, 01/20/2017   Influenza-Unspecified 12/04/2012, 12/15/2020   PFIZER Comirnaty(Gray Top)Covid-19 Tri-Sucrose Vaccine 04/20/2019, 05/11/2019, 12/24/2019, 06/16/2020, 12/15/2020   Pneumococcal Conjugate-13 08/01/2014   Pneumococcal Polysaccharide-23 07/16/2016   Td 01/07/2022   Tdap 04/30/2011   Zoster Recombinant(Shingrix) 08/16/2020, 03/27/2021   Zoster, Live 12/29/2014    Screening Tests Health Maintenance  Topic Date Due   INFLUENZA VACCINE  10/10/2023   Medicare Annual Wellness (AWV)  09/01/2024   Colonoscopy  11/26/2029   DTaP/Tdap/Td (3 - Td or Tdap) 01/08/2032   Pneumococcal Vaccine: 50+ Years  Completed   Hepatitis C Screening  Completed   Zoster Vaccines- Shingrix  Completed   Hepatitis B Vaccines  Aged Out   HPV VACCINES  Aged Out   Meningococcal B Vaccine  Aged Out   COVID-19 Vaccine  Discontinued    Health Maintenance  There are no preventive care reminders to display for this patient.  Health Maintenance Items Addressed:  Will consult PCP regarding the Lung Cancer Screening - pt smoked for 46yrs and has been quitted for >64yrs.  Additional Screening:  Vision Screening: Recommended annual ophthalmology exams for early detection of glaucoma and other disorders of the eye. Would you like a  referral to an eye doctor? No    Dental Screening: Recommended annual dental exams for proper oral hygiene  Community Resource Referral / Chronic Care Management: CRR required this visit?  No   CCM required this visit?  No   Plan:    I have personally reviewed and noted the following in the patient's chart:   Medical and social history Use of alcohol, tobacco or illicit drugs  Current medications and supplements including opioid prescriptions. Patient is not currently taking opioid prescriptions. Functional ability and status Nutritional status Physical activity Advanced directives List of other physicians Hospitalizations, surgeries, and ER visits in previous 12 months Vitals Screenings to include cognitive, depression, and falls Referrals and appointments  In addition, I have reviewed and discussed with patient certain preventive protocols, quality metrics, and best practice recommendations. A written personalized care plan for preventive services as well as general preventive health recommendations were provided to patient.   Verdie CHRISTELLA Saba, CMA   09/02/2023   After Visit Summary: (MyChart) Due to this being a telephonic visit, the after visit summary with patients personalized plan was offered to patient via MyChart   Notes: See Routing notes > will consult w/PCP if the pt needs to have a Lung Cancer Screening test.

## 2023-09-15 ENCOUNTER — Encounter: Payer: Self-pay | Admitting: Neurology

## 2023-10-08 ENCOUNTER — Ambulatory Visit (INDEPENDENT_AMBULATORY_CARE_PROVIDER_SITE_OTHER): Payer: PPO | Admitting: Emergency Medicine

## 2023-10-08 ENCOUNTER — Ambulatory Visit: Payer: Self-pay | Admitting: Emergency Medicine

## 2023-10-08 ENCOUNTER — Encounter: Payer: Self-pay | Admitting: Emergency Medicine

## 2023-10-08 VITALS — BP 126/78 | HR 53 | Temp 98.3°F | Ht 72.0 in | Wt 193.0 lb

## 2023-10-08 DIAGNOSIS — R569 Unspecified convulsions: Secondary | ICD-10-CM | POA: Diagnosis not present

## 2023-10-08 DIAGNOSIS — E785 Hyperlipidemia, unspecified: Secondary | ICD-10-CM

## 2023-10-08 DIAGNOSIS — K219 Gastro-esophageal reflux disease without esophagitis: Secondary | ICD-10-CM

## 2023-10-08 DIAGNOSIS — R7303 Prediabetes: Secondary | ICD-10-CM

## 2023-10-08 DIAGNOSIS — Z8673 Personal history of transient ischemic attack (TIA), and cerebral infarction without residual deficits: Secondary | ICD-10-CM | POA: Diagnosis not present

## 2023-10-08 LAB — COMPREHENSIVE METABOLIC PANEL WITH GFR
ALT: 16 U/L (ref 0–53)
AST: 20 U/L (ref 0–37)
Albumin: 4.4 g/dL (ref 3.5–5.2)
Alkaline Phosphatase: 58 U/L (ref 39–117)
BUN: 8 mg/dL (ref 6–23)
CO2: 27 meq/L (ref 19–32)
Calcium: 9.3 mg/dL (ref 8.4–10.5)
Chloride: 105 meq/L (ref 96–112)
Creatinine, Ser: 0.85 mg/dL (ref 0.40–1.50)
GFR: 85.2 mL/min (ref >60.00)
Glucose, Bld: 107 mg/dL — ABNORMAL HIGH (ref 70–99)
Potassium: 3.9 meq/L (ref 3.5–5.1)
Sodium: 141 meq/L (ref 135–145)
Total Bilirubin: 0.7 mg/dL (ref 0.2–1.2)
Total Protein: 7.1 g/dL (ref 6.0–8.3)

## 2023-10-08 LAB — LIPID PANEL
Cholesterol: 140 mg/dL (ref 0–200)
HDL: 48 mg/dL (ref >39.00)
LDL Cholesterol: 76 mg/dL (ref 0–99)
NonHDL: 92.13
Total CHOL/HDL Ratio: 3
Triglycerides: 80 mg/dL (ref 0.0–149.0)
VLDL: 16 mg/dL (ref 0.0–40.0)

## 2023-10-08 LAB — HEMOGLOBIN A1C: Hgb A1c MFr Bld: 6 % (ref 4.6–6.5)

## 2023-10-08 NOTE — Assessment & Plan Note (Signed)
Stable and well-controlled. Continues omeprazole 40 mg daily

## 2023-10-08 NOTE — Assessment & Plan Note (Signed)
 Eating better and staying physically active Diet and nutrition discussed Continue atorvastatin 80 mg daily

## 2023-10-08 NOTE — Assessment & Plan Note (Signed)
Secondary stroke prevention measures discussed.

## 2023-10-08 NOTE — Progress Notes (Signed)
 Tyrone Curtis 73 y.o.   Chief Complaint  Patient presents with   Annual Exam    Patient here for physical. Patient mentions he's having issues with both of wrist and trigger lock, also has problems with his tinnitus he wants to talk about. Patient needs sodium level checked     HISTORY OF PRESENT ILLNESS: This is a 73 y.o. male here for follow-up of multiple chronic medical conditions Has occasional tinnitus On antiseizure medication.  Needs electrolytes checked Also has occasional problems with restless and trigger locks.  Has history of osteoarthritis No other complaints or medical concerns today.  HPI   Prior to Admission medications   Medication Sig Start Date End Date Taking? Authorizing Provider  Acetaminophen  (TYLENOL  PO) Take by mouth as needed.   Yes [provider]  ASPIRIN 81 PO Take 162 mg by mouth daily.   Yes [provider]  atorvastatin  (LIPITOR) 80 MG tablet TAKE 1 TABLET BY MOUTH DAILY 10/02/22  Yes Reily Ilic Jose, MD  loratadine (CLARITIN) 10 MG tablet Take 10 mg by mouth daily.   Yes [provider]  Multiple Vitamin (MULTIVITAMINS PO) Take 0.5 tablets by mouth daily.   Yes [provider]  omeprazole  (PRILOSEC) 40 MG capsule Take 1 capsule by mouth daily 08/27/23  Yes Analysse Quinonez, Emil Schanz, MD  OXcarbazepine  (TRILEPTAL ) 150 MG tablet Take 1 tablet (150 mg total) by mouth 2 (two) times daily. 08/15/23  Yes Skeet Juliene SAUNDERS, DO    Allergies  Allergen Reactions   Zyrtec D [Cetirizine-Pseudoephedrine Er]     talking out of his head    Patient Active Problem List   Diagnosis Date Noted   New onset seizure (HCC) 03/31/2023   Alcohol abuse 03/31/2023   Trigger ring finger of right hand 10/01/2021   Chronic right shoulder pain 10/01/2021   History of stroke 03/27/2021   Prediabetes 01/02/2018   Overweight (BMI 25.0-29.9) 01/02/2018   History of basal cell carcinoma 08/12/2017   Paroxysmal tachycardia (HCC)  08/23/2016   Smoking history 08/23/2016   Adjustment disorder with mixed anxiety and depressed mood 07/16/2016   Dyslipidemia 01/06/2014   Gastroesophageal reflux disease without esophagitis 01/06/2014   Hemiparesis affecting left side as late effect of cerebrovascular accident (CVA) (HCC) 01/06/2014   Insomnia 07/26/2013   Erectile dysfunction 12/16/2011    Past Medical History:  Diagnosis Date   Allergic rhinitis, cause unspecified    Anxiety state, unspecified    Arthritis    RIGHT shoulder   Basal cell carcinoma    facial   Cataract    bilaterally sx   Chicken pox    CVA (cerebral vascular accident) (HCC) 2012   LEFT sided weakness   Depression    Diaphragmatic hernia without mention of obstruction or gangrene    Dysphagia, unspecified(787.20)    Erectile dysfunction    GERD (gastroesophageal reflux disease)    on meds- CVA affected LEFT side of esophagus   Herpes zoster without mention of complication    Hyperlipidemia    [employer] 04/18/2020   Internal hemorrhoids without mention of complication    Measles    Mumps    x 2   Peripheral vision loss, left 2012   depth preception off as well in the LEFT eye   Personal history of colonic polyps    Pure hypercholesterolemia    Regurgitation    Seasonal allergies    Stroke (HCC) 2012    Past Surgical History:  Procedure Laterality Date  basal cell carcinoma of anterior chest  2000   resection; [place]   cataract surgery  2010   with lens repacement Left   COLONOSCOPY  09/23/12   normal.  Symptoms: anemia, hemoccult+.  [facility].   ESOPHAGOGASTRODUODENOSCOPY  09/23/12   normal.  Symptoms: anemia, hemoccult +, GERD.   EYE SURGERY Bilateral    cataract   FRACTURE SURGERY Left    1957 left arm   small bowel mass  1991   removed part of small intestine benign mass   SMALL INTESTINE SURGERY      Social History   Socioeconomic History   Marital status: Married    Spouse name: Darice   Number of  children: 1   Years of education: Not on file   Highest education level: Bachelor's degree (e.g., BA, AB, BS)  Occupational History   Occupation: Disability    Comment: CVA   Occupation: Previous Network engineer of [employer]  Tobacco Use   Smoking status: Former    Current packs/day: 0.00    Average packs/day: 1 pack/day for 18.0 years (18.0 ttl pk-yrs)    Types: Cigarettes    Start date: 03/11/1964    Quit date: 03/11/1982    Years since quitting: 41.6   Smokeless tobacco: Never   Tobacco comments:    quit in 1984  Vaping Use   Vaping status: Never Used  Substance and Sexual Activity   Alcohol use: Not Currently   Drug use: No   Sexual activity: Yes  Other Topics Concern   Not on file  Social History Narrative   Marital status:  Married x 43 years      Children: 1 daughte (45)r; 1 grandson (30)      Lives: with wife; has mountain house and [place] house at [employer]      Employment: disabled since CVA      Tobacco: none      Alcohol:  1-2 drinks per night      Drugs:  None      Exercise: very active in the yard. No formal exercise program in 2019.      ADLs: independent with ADLs; drives locally; maintains own yard.        Advanced Directives:  NONE in 2019.  FULL CODE; no prolonged measures.         Social Drivers of Corporate investment banker Strain: Low Risk  (09/02/2023)   Overall Financial Resource Strain (CARDIA)    Difficulty of Paying Living Expenses: Not hard at all  Food Insecurity: No Food Insecurity (09/02/2023)   Hunger Vital Sign    Worried About Running Out of Food in the Last Year: Never true    Ran Out of Food in the Last Year: Never true  Transportation Needs: No Transportation Needs (09/02/2023)   PRAPARE - Administrator, Civil Service (Medical): No    Lack of Transportation (Non-Medical): No  Physical Activity: Sufficiently Active (09/02/2023)   Exercise Vital Sign    Days of Exercise per Week: 6 days    Minutes of Exercise per Session: 150+ min   Stress: Stress Concern Present (09/02/2023)   [employer] of Occupational Health - Occupational Stress Questionnaire    Feeling of Stress: To some extent  Social Connections: Moderately Isolated (09/02/2023)   Social Connection and Isolation Panel    Frequency of Communication with Friends and Family: Three times a week    Frequency of Social Gatherings with Friends and Family: Patient declined  Attends Religious Services: Patient declined    Active Member of Clubs or Organizations: No    Attends Banker Meetings: Never    Marital Status: Married  Catering manager Violence: Not At Risk (09/02/2023)   Humiliation, Afraid, Rape, and Kick questionnaire    Fear of Current or Ex-Partner: No    Emotionally Abused: No    Physically Abused: No    Sexually Abused: No    Family History  Problem Relation Age of Onset   Diabetes Mother    Depression Mother    Esophageal varices Mother    Hyperlipidemia Mother    Mental illness Mother    Liver disease Mother    Cancer Father        unknown   Hyperlipidemia Sister    Mental illness Sister        anxiety   Liver disease Sister    Hyperlipidemia Brother    Diabetes Brother    Arthritis Brother        Knee replacement   Bipolar disorder Brother    Liver disease Brother    Heart disease Brother 60       AMI/CAD with stenting   Colon polyps Neg Hx    Colon cancer Neg Hx    Esophageal cancer Neg Hx    Stomach cancer Neg Hx    Rectal cancer Neg Hx      Review of Systems  Constitutional: Negative.  Negative for chills and fever.  HENT:  Positive for tinnitus. Negative for congestion and sore throat.   Respiratory: Negative.  Negative for cough and shortness of breath.   Cardiovascular: Negative.  Negative for chest pain and palpitations.  Gastrointestinal:  Negative for abdominal pain, diarrhea, nausea and vomiting.  Genitourinary: Negative.  Negative for dysuria and hematuria.  Musculoskeletal:  Positive for  joint pain.  Skin: Negative.  Negative for rash.  Neurological:  Negative for dizziness and headaches.  All other systems reviewed and are negative.   Today's Vitals   10/08/23 0759  BP: 126/78  Pulse: (!) 53  Temp: 98.3 F (36.8 C)  TempSrc: Oral  SpO2: 98%  Weight: 193 lb (87.5 kg)  Height: 6' (1.829 m)   Body mass index is 26.18 kg/m.   Physical Exam Vitals reviewed.  Constitutional:      Appearance: Normal appearance.  HENT:     Head: Normocephalic.  Eyes:     Extraocular Movements: Extraocular movements intact.  Cardiovascular:     Rate and Rhythm: Normal rate.  Pulmonary:     Effort: Pulmonary effort is normal.  Musculoskeletal:        General: Deformity present.     Comments: Osteoarthritic changes mostly fingers on right hand.  No synovitis  Skin:    General: Skin is warm and dry.     Capillary Refill: Capillary refill takes less than 2 seconds.  Neurological:     Mental Status: He is alert and oriented to person, place, and time.  Psychiatric:        Mood and Affect: Mood normal.        Behavior: Behavior normal.      ASSESSMENT & PLAN: A total of 43 minutes was spent with the patient and counseling/coordination of care regarding preparing for this visit, review of most recent office visit notes, review of multiple chronic medical conditions and their management, review of all medications, review of most recent bloodwork results, review of health maintenance items, education on nutrition, prognosis, documentation, and  need for follow up.   Problem List Items Addressed This Visit       Digestive   Gastroesophageal reflux disease without esophagitis   Stable and well-controlled. Continues omeprazole  40 mg daily        Other   Dyslipidemia   Eating better and staying physically active Diet and nutrition discussed Continue atorvastatin  80 mg daily      Relevant Orders   Comprehensive metabolic panel with GFR   Lipid panel   Prediabetes    Diet and nutrition discussed Cardiovascular risk associated with diabetes discussed         Relevant Orders   Hemoglobin A1c   History of stroke   Secondary stroke prevention measures discussed       New onset seizure (HCC) - Primary   Likely triggered by increased drinking in setting of remote stroke  Follows up with neurologist Dr. Skeet on regular basis Presently on Trileptal  150 mg twice a day which is also helping his cluster headaches a great deal. No seizures for the past 6 months. Overall doing well. No longer drinking alcohol        Patient Instructions  Health Maintenance After Age 28 After age 34, you are at a higher risk for certain long-term diseases and infections as well as injuries from falls. Falls are a major cause of broken bones and head injuries in people who are older than age 22. Getting regular preventive care can help to keep you healthy and well. Preventive care includes getting regular testing and making lifestyle changes as recommended by your health care provider. Talk with your health care provider about: Which screenings and tests you should have. A screening is a test that checks for a disease when you have no symptoms. A diet and exercise plan that is right for you. What should I know about screenings and tests to prevent falls? Screening and testing are the best ways to find a health problem early. Early diagnosis and treatment give you the best chance of managing medical conditions that are common after age 86. Certain conditions and lifestyle choices may make you more likely to have a fall. Your health care provider may recommend: Regular vision checks. Poor vision and conditions such as cataracts can make you more likely to have a fall. If you wear glasses, make sure to get your prescription updated if your vision changes. Medicine review. Work with your health care provider to regularly review all of the medicines you are taking, including  over-the-counter medicines. Ask your health care provider about any side effects that may make you more likely to have a fall. Tell your health care provider if any medicines that you take make you feel dizzy or sleepy. Strength and balance checks. Your health care provider may recommend certain tests to check your strength and balance while standing, walking, or changing positions. Foot health exam. Foot pain and numbness, as well as not wearing proper footwear, can make you more likely to have a fall. Screenings, including: Osteoporosis screening. Osteoporosis is a condition that causes the bones to get weaker and break more easily. Blood pressure screening. Blood pressure changes and medicines to control blood pressure can make you feel dizzy. Depression screening. You may be more likely to have a fall if you have a fear of falling, feel depressed, or feel unable to do activities that you used to do. Alcohol use screening. Using too much alcohol can affect your balance and may make you more likely to  have a fall. Follow these instructions at home: Lifestyle Do not drink alcohol if: Your health care provider tells you not to drink. If you drink alcohol: Limit how much you have to: 0-1 drink a day for women. 0-2 drinks a day for men. Know how much alcohol is in your drink. In the [place] one drink equals one 12 oz bottle of beer (355 mL), one 5 oz glass of wine (148 mL), or one 1 oz glass of hard liquor (44 mL). Do not use any products that contain nicotine or tobacco. These products include cigarettes, chewing tobacco, and vaping devices, such as e-cigarettes. If you need help quitting, ask your health care provider. Activity  Follow a regular exercise program to stay fit. This will help you maintain your balance. Ask your health care provider what types of exercise are appropriate for you. If you need a cane or walker, use it as recommended by your health care provider. Wear supportive shoes  that have nonskid soles. Safety  Remove any tripping hazards, such as rugs, cords, and clutter. Install safety equipment such as grab bars in bathrooms and safety rails on stairs. Keep rooms and walkways well-lit. General instructions Talk with your health care provider about your risks for falling. Tell your health care provider if: You fall. Be sure to tell your health care provider about all falls, even ones that seem minor. You feel dizzy, tiredness (fatigue), or off-balance. Take over-the-counter and prescription medicines only as told by your health care provider. These include supplements. Eat a healthy diet and maintain a healthy weight. A healthy diet includes low-fat dairy products, low-fat (lean) meats, and fiber from whole grains, beans, and lots of fruits and vegetables. Stay current with your vaccines. Schedule regular health, dental, and eye exams. Summary Having a healthy lifestyle and getting preventive care can help to protect your health and wellness after age 51. Screening and testing are the best way to find a health problem early and help you avoid having a fall. Early diagnosis and treatment give you the best chance for managing medical conditions that are more common for people who are older than age 73. Falls are a major cause of broken bones and head injuries in people who are older than age 20. Take precautions to prevent a fall at home. Work with your health care provider to learn what changes you can make to improve your health and wellness and to prevent falls. This information is not intended to replace advice given to you by your health care provider. Make sure you discuss any questions you have with your health care provider. Document Revised: 07/17/2020 Document Reviewed: 07/17/2020 Elsevier Patient Education  2024 Elsevier Inc.     Emil Schaumann, MD [facility]

## 2023-10-08 NOTE — Patient Instructions (Signed)
 Health Maintenance After Age 75 After age 4, you are at a higher risk for certain long-term diseases and infections as well as injuries from falls. Falls are a major cause of broken bones and head injuries in people who are older than age 47. Getting regular preventive care can help to keep you healthy and well. Preventive care includes getting regular testing and making lifestyle changes as recommended by your health care provider. Talk with your health care provider about: Which screenings and tests you should have. A screening is a test that checks for a disease when you have no symptoms. A diet and exercise plan that is right for you. What should I know about screenings and tests to prevent falls? Screening and testing are the best ways to find a health problem early. Early diagnosis and treatment give you the best chance of managing medical conditions that are common after age 37. Certain conditions and lifestyle choices may make you more likely to have a fall. Your health care provider may recommend: Regular vision checks. Poor vision and conditions such as cataracts can make you more likely to have a fall. If you wear glasses, make sure to get your prescription updated if your vision changes. Medicine review. Work with your health care provider to regularly review all of the medicines you are taking, including over-the-counter medicines. Ask your health care provider about any side effects that may make you more likely to have a fall. Tell your health care provider if any medicines that you take make you feel dizzy or sleepy. Strength and balance checks. Your health care provider may recommend certain tests to check your strength and balance while standing, walking, or changing positions. Foot health exam. Foot pain and numbness, as well as not wearing proper footwear, can make you more likely to have a fall. Screenings, including: Osteoporosis screening. Osteoporosis is a condition that causes  the bones to get weaker and break more easily. Blood pressure screening. Blood pressure changes and medicines to control blood pressure can make you feel dizzy. Depression screening. You may be more likely to have a fall if you have a fear of falling, feel depressed, or feel unable to do activities that you used to do. Alcohol use screening. Using too much alcohol can affect your balance and may make you more likely to have a fall. Follow these instructions at home: Lifestyle Do not drink alcohol if: Your health care provider tells you not to drink. If you drink alcohol: Limit how much you have to: 0-1 drink a day for women. 0-2 drinks a day for men. Know how much alcohol is in your drink. In the [place] one drink equals one 12 oz bottle of beer (355 mL), one 5 oz glass of wine (148 mL), or one 1 oz glass of hard liquor (44 mL). Do not use any products that contain nicotine or tobacco. These products include cigarettes, chewing tobacco, and vaping devices, such as e-cigarettes. If you need help quitting, ask your health care provider. Activity  Follow a regular exercise program to stay fit. This will help you maintain your balance. Ask your health care provider what types of exercise are appropriate for you. If you need a cane or walker, use it as recommended by your health care provider. Wear supportive shoes that have nonskid soles. Safety  Remove any tripping hazards, such as rugs, cords, and clutter. Install safety equipment such as grab bars in bathrooms and safety rails on stairs. Keep rooms and walkways  well-lit. General instructions Talk with your health care provider about your risks for falling. Tell your health care provider if: You fall. Be sure to tell your health care provider about all falls, even ones that seem minor. You feel dizzy, tiredness (fatigue), or off-balance. Take over-the-counter and prescription medicines only as told by your health care provider. These include  supplements. Eat a healthy diet and maintain a healthy weight. A healthy diet includes low-fat dairy products, low-fat (lean) meats, and fiber from whole grains, beans, and lots of fruits and vegetables. Stay current with your vaccines. Schedule regular health, dental, and eye exams. Summary Having a healthy lifestyle and getting preventive care can help to protect your health and wellness after age 11. Screening and testing are the best way to find a health problem early and help you avoid having a fall. Early diagnosis and treatment give you the best chance for managing medical conditions that are more common for people who are older than age 28. Falls are a major cause of broken bones and head injuries in people who are older than age 48. Take precautions to prevent a fall at home. Work with your health care provider to learn what changes you can make to improve your health and wellness and to prevent falls. This information is not intended to replace advice given to you by your health care provider. Make sure you discuss any questions you have with your health care provider. Document Revised: 07/17/2020 Document Reviewed: 07/17/2020 Elsevier Patient Education  2024 [employer].

## 2023-10-08 NOTE — Assessment & Plan Note (Signed)
 Likely triggered by increased drinking in setting of remote stroke  Follows up with neurologist Dr. Skeet on regular basis Presently on Trileptal  150 mg twice a day which is also helping his cluster headaches a great deal. No seizures for the past 6 months. Overall doing well. No longer drinking alcohol

## 2023-10-08 NOTE — Assessment & Plan Note (Signed)
Diet and nutrition discussed. Cardiovascular risk associated with diabetes discussed.

## 2023-10-09 ENCOUNTER — Ambulatory Visit: Admitting: Neurology

## 2023-10-10 ENCOUNTER — Ambulatory Visit: Admitting: Neurology

## 2023-11-23 ENCOUNTER — Encounter: Payer: Self-pay | Admitting: Emergency Medicine

## 2023-11-24 ENCOUNTER — Other Ambulatory Visit: Payer: Self-pay | Admitting: Radiology

## 2023-11-24 DIAGNOSIS — E785 Hyperlipidemia, unspecified: Secondary | ICD-10-CM

## 2023-11-24 MED ORDER — ATORVASTATIN CALCIUM 80 MG PO TABS
ORAL_TABLET | ORAL | 3 refills | Status: AC
Start: 2023-11-24 — End: ?

## 2024-02-07 ENCOUNTER — Encounter: Payer: Self-pay | Admitting: Emergency Medicine

## 2024-02-07 ENCOUNTER — Encounter: Payer: Self-pay | Admitting: Neurology

## 2024-02-09 ENCOUNTER — Other Ambulatory Visit: Payer: Self-pay

## 2024-02-09 DIAGNOSIS — K219 Gastro-esophageal reflux disease without esophagitis: Secondary | ICD-10-CM

## 2024-02-09 MED ORDER — OXCARBAZEPINE 150 MG PO TABS: 150.0000 mg | ORAL_TABLET | Freq: Two times a day (BID) | ORAL | 0 refills | Status: DC

## 2024-02-09 MED ORDER — OMEPRAZOLE 40 MG PO CPDR: DELAYED_RELEASE_CAPSULE | ORAL | 1 refills | Status: AC

## 2024-02-09 MED ORDER — OMEPRAZOLE 40 MG PO CPDR: DELAYED_RELEASE_CAPSULE | ORAL | 1 refills | Status: DC

## 2024-03-01 ENCOUNTER — Other Ambulatory Visit

## 2024-03-01 LAB — BASIC METABOLIC PANEL WITH GFR
BUN: 11 mg/dL (ref 7–25)
CO2: 31 mmol/L (ref 20–32)
Calcium: 9.2 mg/dL (ref 8.6–10.3)
Chloride: 103 mmol/L (ref 98–110)
Creat: 0.85 mg/dL (ref 0.70–1.28)
Glucose, Bld: 105 mg/dL — ABNORMAL HIGH (ref 65–99)
Potassium: 4.3 mmol/L (ref 3.5–5.3)
Sodium: 138 mmol/L (ref 135–146)
eGFR: 91 mL/min/1.73m2

## 2024-03-08 NOTE — Progress Notes (Unsigned)
 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 SUNCT    For headache prevention:  Oxcarbazepine  150mg  twice daily.  Side effects discussed.  We can increase to 300mg  twice daily if needed.  Repeat BMP (to monitor Na) in 6 months.   Continue alcohol cessation  Follow up 6 months.  Subjective:  Tyrone Curtis is a 73 year old right-handed male with history of right PCA stroke who follows up for SUNCT and isolated seizure.  UPDATE: SUNCT: Started oxcarbazepine  in June.  Currently taking 150mg  twice daily.  ***  03/01/2024 LABS:  BMP with Na 138, K 4.3, Cl 103, CO2 31, Ca 9.2, glucose 105, BUN 11, Cr 0.85.  Isolated seizure: No recurrence.  He is no longer drinking any alcohol.    HISTORY: New-onset seizure: On the evening of 03/18/2023, he was watching TV in bed with his wife when she turned to him and saw that he was exhibiting generalized shaking with eyes rolled back, foaming at mouth and unresponsive.  This lasted a few minutes  He was confused afterwards.  No tongue biting or incontinence.  He did state that he had to go to the bathroom which he did.  He has no recollection of anything until he was in the ambulance.  He was seen that night in the ED at [facility].  CT and follow up MRI of brain revealed large chronic right PCA territory infarct including involvement of the right hippocampal formation but no acute findings.  Blood pressure was elevated in the 150s-160s systolic.  Labs revealed elevated venous lactic acid of 2.9 but otherwise unremarkable, including CBC, CMP, troponin, UA, UDS and ethanol.  ECG unremarkable.  He had some dizziness and headache afterward.  He told his wife that evening that he wasn't feeling well.  Since his stroke in 2012, he has binge drinking off and on.  He typically will drink beer.  However, this Christmas, he was drinking  hard liquor including bourbon, rum and tequila.  That evening, he had a strong cup of coffee with rum.  No recent illness, head trauma or change in medication.  Routine awake and drowsy EEG on 03/26/2023 was normal.  SUNCT: He began having intermittent facial pain since around 2005.  He describes a left retroorbital pain radiating to the upper teeth on the left.  It is a 10/10 paroxysmal sharp pain lasting several seconds.  There is associated left ptosis, conjunctival injection, lacrimation, nasal congestion and left cheek swelling.  Left eye appears sunken.  It is triggered by touching face, chewing, washing face and brushing teeth.  It tends to not be spontaneous.  He may have several months during the year where he no longer has the pain.  However, it is unpredictable when it occurs.   In 2015, he had a CT of the sinuses which showed mild bilateral maxillary sinus disease and was treated with antibiotics for presumed sinus infection.  However pain continued.    Past medications:  lamotrigine  (rash), topiramate  (adverse effects)  Imaging: 03/18/2023 CT HEAD:  1. No acute intracranial CT findings or interval changes. 2. Chronic right occipitotemporal infarct with extension to the dorsal right thalamus. 3. Chronic lacunar infarct in the body of the right caudate nucleus. 4. Atrophy and small-vessel disease. 5. Sinus membrane disease. 03/19/2023 MRI BRAIN W WO:  1. No acute intracranial abnormality. 2. 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 3. Additional chronic small vessel disease, not significantly changed from 2023.  PAST MEDICAL HISTORY: Past Medical History:  Diagnosis Date   Allergic rhinitis, cause unspecified    Anxiety state, unspecified    Arthritis    RIGHT shoulder   Basal cell carcinoma    facial   Cataract    bilaterally sx   Chicken pox    CVA (cerebral vascular accident) (HCC) 2012   LEFT  sided weakness   Depression    Diaphragmatic hernia without mention of obstruction or gangrene    Dysphagia, unspecified(787.20)    Erectile dysfunction    GERD (gastroesophageal reflux disease)    on meds- CVA affected LEFT side of esophagus   Herpes zoster without mention of complication    Hyperlipidemia    [employer] 04/18/2020   Internal hemorrhoids without mention of complication    Measles    Mumps    x 2   Peripheral vision loss, left 2012   depth preception off as well in the LEFT eye   Personal history of colonic polyps    Pure hypercholesterolemia    Regurgitation    Seasonal allergies    Stroke [facility]) 2012    MEDICATIONS: Current Outpatient Medications on File Prior to Visit  Medication Sig Dispense Refill   Acetaminophen  (TYLENOL  PO) Take by mouth as needed.     ASPIRIN 81 PO Take 162 mg by mouth daily.     atorvastatin  (LIPITOR) 80 MG tablet TAKE 1 TABLET BY MOUTH DAILY 90 tablet 3   loratadine (CLARITIN) 10 MG tablet Take 10 mg by mouth daily.     Multiple Vitamin (MULTIVITAMINS PO) Take 0.5 tablets by mouth daily.     omeprazole  (PRILOSEC) 40 MG capsule Take 1 capsule by mouth daily 90 capsule 1   OXcarbazepine  (TRILEPTAL ) 150 MG tablet Take 1 tablet (150 mg total) by mouth 2 (two) times daily. 60 tablet 0   No current facility-administered medications on file prior to visit.    ALLERGIES: Allergies  Allergen Reactions   Zyrtec D [Cetirizine-Pseudoephedrine Er]     talking out of his head    FAMILY HISTORY: Family History  Problem Relation Age of Onset   Diabetes Mother    Depression Mother    Esophageal varices Mother    Hyperlipidemia Mother    Mental illness Mother    Liver disease Mother    Cancer Father        unknown   Hyperlipidemia Sister    Mental illness Sister        anxiety   Liver disease Sister    Hyperlipidemia Brother    Diabetes Brother    Arthritis Brother        Knee replacement   Bipolar disorder Brother    Liver  disease Brother    Heart disease Brother 52       AMI/CAD with stenting   Colon polyps Neg Hx    Colon cancer Neg Hx    Esophageal cancer Neg Hx    Stomach cancer Neg Hx    Rectal cancer Neg Hx       Objective:  ***  General: No acute distress.  Patient appears well-groomed.   Head:  Normocephalic/atraumatic Neck:  Supple.  No paraspinal tenderness.  Full range of motion. Heart:  Regular rate and rhythm. Neuro:  Alert and oriented.  Speech fluent and not dysarthric.  Language intact.  CN II-XII intact.  Bulk and tone normal.  Muscle strength 5/5 throughout.  Sensation to light touch intact.  Deep tendon reflexes 2+ throughout, toes downgoing.  Gait normal.  Romberg negative.    Juliene Dunnings, DO  CC: Emil Aloysius Schaumann, MD       "

## 2024-03-09 ENCOUNTER — Ambulatory Visit: Admitting: Neurology

## 2024-03-09 ENCOUNTER — Encounter: Payer: Self-pay | Admitting: Neurology

## 2024-03-09 VITALS — BP 149/80 | HR 60 | Ht 72.0 in | Wt 199.0 lb

## 2024-03-09 DIAGNOSIS — R569 Unspecified convulsions: Secondary | ICD-10-CM | POA: Diagnosis not present

## 2024-03-09 DIAGNOSIS — G500 Trigeminal neuralgia: Secondary | ICD-10-CM

## 2024-03-09 DIAGNOSIS — Z8673 Personal history of transient ischemic attack (TIA), and cerebral infarction without residual deficits: Secondary | ICD-10-CM

## 2024-03-09 MED ORDER — OXCARBAZEPINE 300 MG PO TABS: 300.0000 mg | ORAL_TABLET | Freq: Two times a day (BID) | ORAL | 5 refills | Status: AC

## 2024-03-09 NOTE — Patient Instructions (Signed)
 Increase oxcarbazepine  to 300mg  twice daily.  Get labs to check sodium with PCP in early February as scheduled and let me know when it is performed. Follow up in 6 months.

## 2024-04-12 ENCOUNTER — Ambulatory Visit: Admitting: Emergency Medicine

## 2024-04-12 ENCOUNTER — Telehealth: Payer: Self-pay

## 2024-04-12 NOTE — Telephone Encounter (Signed)
 I have contacted patient wife on his behalf left voicemail letting him know that we are needing to reschedule his appointment due to weather but at this time I am going to cancel it. Please make sure when patient calls in that the appointment is rescheduled for a 6 month f/u

## 2024-04-13 ENCOUNTER — Ambulatory Visit: Admitting: Emergency Medicine

## 2024-04-14 ENCOUNTER — Ambulatory Visit: Admitting: Emergency Medicine

## 2024-04-27 ENCOUNTER — Ambulatory Visit: Admitting: Emergency Medicine

## 2024-09-02 ENCOUNTER — Ambulatory Visit

## 2024-09-20 ENCOUNTER — Ambulatory Visit: Payer: Self-pay | Admitting: Neurology
